# Patient Record
Sex: Female | Born: 1945 | Race: Black or African American | Hispanic: Yes | Smoking: Former smoker
Health system: Southern US, Community
[De-identification: ages and names within clinical notes are randomized; demographics above are authoritative.]

## PROBLEM LIST (undated history)

## (undated) DIAGNOSIS — L253 Unspecified contact dermatitis due to other chemical products: Secondary | ICD-10-CM

## (undated) DIAGNOSIS — R05 Cough: Secondary | ICD-10-CM

## (undated) DIAGNOSIS — L309 Dermatitis, unspecified: Secondary | ICD-10-CM

## (undated) DIAGNOSIS — H409 Unspecified glaucoma: Secondary | ICD-10-CM

## (undated) DIAGNOSIS — W19XXXA Unspecified fall, initial encounter: Secondary | ICD-10-CM

## (undated) DIAGNOSIS — M1711 Unilateral primary osteoarthritis, right knee: Secondary | ICD-10-CM

## (undated) DIAGNOSIS — T8859XA Other complications of anesthesia, initial encounter: Secondary | ICD-10-CM

## (undated) DIAGNOSIS — G8929 Other chronic pain: Secondary | ICD-10-CM

## (undated) DIAGNOSIS — G4700 Insomnia, unspecified: Secondary | ICD-10-CM

## (undated) DIAGNOSIS — M2540 Effusion, unspecified joint: Secondary | ICD-10-CM

## (undated) DIAGNOSIS — R011 Cardiac murmur, unspecified: Secondary | ICD-10-CM

## (undated) DIAGNOSIS — D649 Anemia, unspecified: Secondary | ICD-10-CM

## (undated) DIAGNOSIS — K219 Gastro-esophageal reflux disease without esophagitis: Secondary | ICD-10-CM

## (undated) DIAGNOSIS — F329 Major depressive disorder, single episode, unspecified: Secondary | ICD-10-CM

## (undated) DIAGNOSIS — M62838 Other muscle spasm: Secondary | ICD-10-CM

## (undated) DIAGNOSIS — R911 Solitary pulmonary nodule: Secondary | ICD-10-CM

## (undated) DIAGNOSIS — R350 Frequency of micturition: Secondary | ICD-10-CM

## (undated) DIAGNOSIS — F32A Depression, unspecified: Secondary | ICD-10-CM

## (undated) DIAGNOSIS — M1990 Unspecified osteoarthritis, unspecified site: Secondary | ICD-10-CM

## (undated) DIAGNOSIS — M2550 Pain in unspecified joint: Secondary | ICD-10-CM

## (undated) DIAGNOSIS — R059 Cough, unspecified: Secondary | ICD-10-CM

## (undated) DIAGNOSIS — I10 Essential (primary) hypertension: Secondary | ICD-10-CM

## (undated) DIAGNOSIS — Y92239 Unspecified place in hospital as the place of occurrence of the external cause: Secondary | ICD-10-CM

## (undated) DIAGNOSIS — Z8709 Personal history of other diseases of the respiratory system: Secondary | ICD-10-CM

## (undated) DIAGNOSIS — E785 Hyperlipidemia, unspecified: Secondary | ICD-10-CM

## (undated) DIAGNOSIS — M549 Dorsalgia, unspecified: Secondary | ICD-10-CM

## (undated) DIAGNOSIS — T4145XA Adverse effect of unspecified anesthetic, initial encounter: Secondary | ICD-10-CM

## (undated) DIAGNOSIS — S86811A Strain of other muscle(s) and tendon(s) at lower leg level, right leg, initial encounter: Secondary | ICD-10-CM

## (undated) DIAGNOSIS — E119 Type 2 diabetes mellitus without complications: Secondary | ICD-10-CM

## (undated) DIAGNOSIS — K5900 Constipation, unspecified: Secondary | ICD-10-CM

## (undated) DIAGNOSIS — J45909 Unspecified asthma, uncomplicated: Secondary | ICD-10-CM

## (undated) DIAGNOSIS — Z96659 Presence of unspecified artificial knee joint: Secondary | ICD-10-CM

## (undated) DIAGNOSIS — M1712 Unilateral primary osteoarthritis, left knee: Secondary | ICD-10-CM

## (undated) DIAGNOSIS — R3915 Urgency of urination: Secondary | ICD-10-CM

---

## 1995-06-14 HISTORY — PX: GALLBLADDER SURGERY: SHX652

## 2014-04-17 ENCOUNTER — Encounter (HOSPITAL_COMMUNITY): Payer: Self-pay | Admitting: Family Medicine

## 2014-04-17 ENCOUNTER — Emergency Department (HOSPITAL_COMMUNITY)
Admission: EM | Admit: 2014-04-17 | Discharge: 2014-04-17 | Disposition: A | Discharge status: Home / Self Care | Payer: Medicare HMO | Source: Home / Self Care | Attending: Family Medicine | Admitting: Family Medicine

## 2014-04-17 DIAGNOSIS — M1990 Unspecified osteoarthritis, unspecified site: Secondary | ICD-10-CM

## 2014-04-17 DIAGNOSIS — I10 Essential (primary) hypertension: Secondary | ICD-10-CM

## 2014-04-17 HISTORY — DX: Unspecified glaucoma: H40.9

## 2014-04-17 HISTORY — DX: Type 2 diabetes mellitus without complications: E11.9

## 2014-04-17 HISTORY — DX: Solitary pulmonary nodule: R91.1

## 2014-04-17 HISTORY — DX: Essential (primary) hypertension: I10

## 2014-04-17 HISTORY — DX: Insomnia, unspecified: G47.00

## 2014-04-17 HISTORY — DX: Unspecified asthma, uncomplicated: J45.909

## 2014-04-17 HISTORY — DX: Hyperlipidemia, unspecified: E78.5

## 2014-04-17 HISTORY — DX: Unspecified osteoarthritis, unspecified site: M19.90

## 2014-04-17 MED ORDER — VALSARTAN 160 MG PO TABS: 160.0000 mg | ORAL_TABLET | Freq: Every day | ORAL | Status: DC

## 2014-04-17 MED ORDER — TRAMADOL HCL 50 MG PO TABS: 50.0000 mg | ORAL_TABLET | Freq: Four times a day (QID) | ORAL | Status: DC | PRN

## 2014-04-17 MED ORDER — MELOXICAM 15 MG PO TABS: 7.5000 mg | ORAL_TABLET | Freq: Every day | ORAL | Status: DC

## 2014-04-17 NOTE — Discharge Instructions (Signed)
Please start taking the meloxicam daily for the knee pain as well as 1-2 tramadol Please restart yoru blood pressure medications Please make sure to keep your appointment with your new doctor [facility] 

## 2014-04-17 NOTE — ED Notes (Signed)
Pt triaged and assessed by provider.   Provider in before nurse. 

## 2014-04-17 NOTE — ED Provider Notes (Signed)
CSN: [id number]     Arrival date & time 04/17/14  1612 History   First MD Initiated Contact with Patient 04/17/14 1647     Chief Complaint  Patient presents with  . Medication Refill   (Consider location/radiation/quality/duration/timing/severity/associated sxs/prior Treatment) HPI  Persistent bilat knee pain. Chronic osteoarthritis. Voltaren is not working. Tramadol works. Just moved to area. Has appt w/ new PCP on 05/01/14. PCP is Dr. Dorrene GermanEdwin A Avbuere.     HTN: no CP, palpitations, SOB. Out of diovan since moving.   Past Medical History  Diagnosis Date  . Insomnia   . Glaucoma   . Pulmonary nodule   . Asthma   . HLD (hyperlipidemia)   . Hypertension   . Diabetes mellitus without complication   . Schizoaffective disorder   . Osteoarthritis     knees   No past surgical history on file. No family history on file. History  Substance Use Topics  . Smoking status: Former Smoker    Quit date: 03/17/1989  . Smokeless tobacco: Not on file  . Alcohol Use: Not on file   OB History    No data available     Review of Systems Per HPI with all other pertinent systems negative.   Allergies  Bactrim; Codeine; Iodine; Vicodin; and Ivp dye  Home Medications   Prior to Admission medications   Medication Sig Start Date End Date Taking? Authorizing Provider  albuterol (PROVENTIL HFA;VENTOLIN HFA) 108 (90 BASE) MCG/ACT inhaler Inhale into the lungs every 6 (six) hours as needed for wheezing or shortness of breath.   Yes Historical Provider, MD  aspirin 81 MG tablet Take 81 mg by mouth daily.   Yes Historical Provider, MD  baclofen (LIORESAL) 10 MG tablet Take 10 mg by mouth 3 (three) times daily.   Yes Historical Provider, MD  budesonide-formoterol (SYMBICORT) 80-4.5 MCG/ACT inhaler Inhale 2 puffs into the lungs 2 (two) times daily.   Yes Historical Provider, MD  diclofenac (VOLTAREN) 75 MG EC tablet Take 75 mg by mouth 2 (two) times daily.   Yes Historical Provider, MD   levocetirizine (XYZAL) 2.5 MG/5ML solution Take 5 mg by mouth every evening.   Yes Historical Provider, MD  pravastatin (PRAVACHOL) 40 MG tablet Take 40 mg by mouth daily.   Yes Historical Provider, MD  pregabalin (LYRICA) 50 MG capsule Take 50 mg by mouth 2 (two) times daily.   Yes Historical Provider, MD  traZODone (DESYREL) 100 MG tablet Take 100 mg by mouth at bedtime.   Yes Historical Provider, MD  meloxicam (MOBIC) 15 MG tablet Take 0.5-1 tablets (7.5-15 mg total) by mouth daily. 04/17/14   Ozella Rocksavid J Merrell, MD  traMADol (ULTRAM) 50 MG tablet Take 1-2 tablets (50-100 mg total) by mouth every 6 (six) hours as needed. 04/17/14   Ozella Rocksavid J Merrell, MD  valsartan (DIOVAN) 160 MG tablet Take 1 tablet (160 mg total) by mouth daily. 04/17/14   Ozella Rocksavid J Merrell, MD   BP 159/77 mmHg  Pulse 69  Temp(Src) 98.9 F (37.2 C) (Oral)  Resp 20  SpO2 98% Physical Exam  ED Course  Procedures (including critical care time) Labs Review Labs Reviewed - No data to display  Imaging Review No results found.   MDM   1. Essential hypertension   2. Arthritis    Schizoaffective disorder: at baseline  Hypertensive today: refill valsartan  OA: refill tramadol. Change voltaren to meloxicam. F/u new PCP  Precautions given and all questions answered  Shelly Flattenavid Merrell, MD [facility] 04/17/2014, 5:10 PM    Ozella Rocksavid J Merrell, MD 04/17/14 1710

## 2014-05-20 ENCOUNTER — Ambulatory Visit: Payer: Self-pay | Admitting: Podiatry

## 2014-06-12 ENCOUNTER — Encounter: Payer: Self-pay | Admitting: *Deleted

## 2014-07-09 ENCOUNTER — Telehealth: Payer: Self-pay | Admitting: *Deleted

## 2014-07-09 NOTE — Telephone Encounter (Signed)
Patient left message on Nurse Voicemail that she is having trouble getting through to the office and she has a question about her appointment.  I have called patient and received no answer.  I left a message asking her to contact me.  I will await return call.

## 2014-07-14 ENCOUNTER — Encounter: Payer: Medicare HMO | Admitting: Obstetrics & Gynecology

## 2014-07-17 ENCOUNTER — Encounter: Payer: Medicare HMO | Admitting: Family Medicine

## 2014-07-18 ENCOUNTER — Encounter: Payer: Self-pay | Admitting: Internal Medicine

## 2014-07-23 ENCOUNTER — Telehealth: Payer: Self-pay | Admitting: *Deleted

## 2014-07-23 NOTE — Telephone Encounter (Signed)
Pt no showed Pv 0800 am 2-10 Wednesday. [facility]. At 0832 am.  Brooke Davidson PV

## 2014-07-30 ENCOUNTER — Telehealth: Payer: Self-pay | Admitting: Podiatry

## 2014-07-30 NOTE — Telephone Encounter (Signed)
PT CALLED AND WAS VERY RUDE TO JESSICA S STATING SHE LEFT A MESSAGE A MONTH AGO AND TODAY AND NO ONE HAS CALLED HER BACK. THE MESSAGE FROM TODAY WAS AT 1042 AM AND PT CALLED BACK AT 1053 AM. I CALLED PT BACK AT 1110 AM AND LEFT MESSAGE FOR PT TO CALL ME BACK TO SCHEDULE AN APPT.

## 2014-08-04 ENCOUNTER — Other Ambulatory Visit (HOSPITAL_COMMUNITY)
Admission: RE | Admit: 2014-08-04 | Discharge: 2014-08-04 | Disposition: A | Discharge status: Home / Self Care | Payer: Commercial Managed Care - HMO | Source: Ambulatory Visit | Attending: Obstetrics & Gynecology | Admitting: Obstetrics & Gynecology

## 2014-08-04 ENCOUNTER — Encounter: Payer: Self-pay | Admitting: Obstetrics & Gynecology

## 2014-08-04 ENCOUNTER — Ambulatory Visit (INDEPENDENT_AMBULATORY_CARE_PROVIDER_SITE_OTHER): Payer: Medicare HMO | Admitting: Obstetrics & Gynecology

## 2014-08-04 ENCOUNTER — Encounter: Payer: Self-pay | Admitting: Nurse Practitioner

## 2014-08-04 VITALS — BP 144/63 | HR 55 | Wt 153.8 lb

## 2014-08-04 DIAGNOSIS — Z124 Encounter for screening for malignant neoplasm of cervix: Secondary | ICD-10-CM

## 2014-08-04 DIAGNOSIS — Z01419 Encounter for gynecological examination (general) (routine) without abnormal findings: Secondary | ICD-10-CM | POA: Insufficient documentation

## 2014-08-04 DIAGNOSIS — Z1151 Encounter for screening for human papillomavirus (HPV): Secondary | ICD-10-CM

## 2014-08-04 DIAGNOSIS — R87619 Unspecified abnormal cytological findings in specimens from cervix uteri: Secondary | ICD-10-CM | POA: Diagnosis not present

## 2014-08-04 NOTE — Progress Notes (Signed)
Pt here for annual exam.

## 2014-08-04 NOTE — Progress Notes (Signed)
Patient ID: Brooke FlingSandra Davidson, female   DOB: Jun 09, 1946, 69 y.o.   MRN: [id number]  Chief Complaint  Patient presents with  . Gynecologic Exam    HPI Brooke Davidson is a 69 y.o. female.  No LMP recorded. Patient is postmenopausal.age 69  [place] and was told they were watching her paps.  HPI  Past Medical History  Diagnosis Date  . Insomnia   . Glaucoma   . Pulmonary nodule   . Asthma   . HLD (hyperlipidemia)   . Hypertension   . Diabetes mellitus without complication   . Schizoaffective disorder   . Osteoarthritis     knees    Past Surgical History  Procedure Laterality Date  . Gallbladder surgery      History reviewed. No pertinent family history.  Social History History  Substance Use Topics  . Smoking status: Former Smoker    Quit date: 03/17/1989  . Smokeless tobacco: Not on file  . Alcohol Use: Not on file    Allergies  Allergen Reactions  . Bactrim [Sulfamethoxazole-Trimethoprim] Swelling  . Codeine Itching  . Iodine   . Vicodin [Hydrocodone-Acetaminophen] Itching  . Ivp Dye [Iodinated Diagnostic Agents] Swelling and Rash    Current Outpatient Prescriptions  Medication Sig Dispense Refill  . albuterol (PROVENTIL HFA;VENTOLIN HFA) 108 (90 BASE) MCG/ACT inhaler Inhale into the lungs every 6 (six) hours as needed for wheezing or shortness of breath.    Marland Kitchen. aspirin 81 MG tablet Take 81 mg by mouth daily.    . baclofen (LIORESAL) 10 MG tablet Take 10 mg by mouth 3 (three) times daily.    . budesonide-formoterol (SYMBICORT) 80-4.5 MCG/ACT inhaler Inhale 2 puffs into the lungs 2 (two) times daily.    Marland Kitchen. desoximetasone (TOPICORT) 0.05 % cream Apply 1 application topically 2 (two) times daily.    . diclofenac (VOLTAREN) 75 MG EC tablet Take 75 mg by mouth 2 (two) times daily.    Marland Kitchen. ENSURE PLUS (ENSURE PLUS) LIQD Take 237 mLs by mouth.    . fluocinonide cream (LIDEX) 0.05 % Apply 1 application topically 2 (two) times daily.    Marland Kitchen.  levocetirizine (XYZAL) 2.5 MG/5ML solution Take 5 mg by mouth every evening.    . metFORMIN (GLUCOPHAGE) 500 MG tablet Take 500 mg by mouth 2 (two) times daily with a meal.    . Olopatadine HCl 0.2 % SOLN Apply 1 drop to eye.    . pravastatin (PRAVACHOL) 40 MG tablet Take 40 mg by mouth daily.    . pregabalin (LYRICA) 50 MG capsule Take 50 mg by mouth 2 (two) times daily.    . traMADol (ULTRAM) 50 MG tablet Take 1-2 tablets (50-100 mg total) by mouth every 6 (six) hours as needed. 60 tablet 0  . traZODone (DESYREL) 100 MG tablet Take 100 mg by mouth at bedtime.    . valsartan (DIOVAN) 160 MG tablet Take 1 tablet (160 mg total) by mouth daily. 30 tablet 0   No current facility-administered medications for this visit.    Review of Systems Review of Systems  Constitutional: Negative.   Respiratory: Negative.   Gastrointestinal: Negative.   Genitourinary: Negative.   Musculoskeletal: Positive for arthralgias (knees).    Blood pressure 144/63, pulse 55, weight 153 lb 12.8 oz (69.763 kg).  Physical Exam Physical Exam  Constitutional: She is oriented to person, place, and time. She appears well-developed. No distress.  Pulmonary/Chest: Effort normal.  Breasts: breasts appear normal, no suspicious masses, no  skin or nipple changes or axillary nodes.   Abdominal: Soft. She exhibits no distension. There is no tenderness.  Genitourinary: Vagina normal and uterus normal. No vaginal discharge found.  Pap done cx nl, no mass  Neurological: She is alert and oriented to person, place, and time.  Skin: Skin is dry.  Psychiatric: She has a normal mood and affect.    Data Reviewed [facility] medical referral  Assessment    Postmenopausal states recent h/o abnl pap. H/O schizophrenia   well woman gyn exam  Plan    Pap result, ROI from Gyn in [place]  Mammogram        ARNOLD,JAMES 08/04/2014, 4:34 PM

## 2014-08-04 NOTE — Patient Instructions (Signed)
Mammography Mammography is an X-ray of the breasts to look for changes that are not normal. The X-ray image is called a mammogram. This procedure can screen for breast cancer, can detect cancer early, and can diagnose cancer.  LET YOUR CAREGIVER KNOW ABOUT:  Breast implants.  Previous breast disease, biopsy, or surgery.  If you are breastfeeding.  Medicines taken, including vitamins, herbs, eyedrops, over-the-counter medicines, and creams.  Use of steroids (by mouth or creams).  Possibility of pregnancy, if this applies. RISKS AND COMPLICATIONS  Exposure to radiation, but at very low levels.  The results may be misinterpreted.  The results may not be accurate.  Mammography may lead to further tests.  Mammography may not catch certain cancers. BEFORE THE PROCEDURE  Schedule your test about 7 days after your menstrual period. This is when your breasts are the least tender and have signs of hormone changes.  If you have had a mammography done at a different facility in the past, get the mammogram X-rays or have them sent to your current exam facility in order to compare them.  Wash your breasts and under your arms the day of the test.  Do not wear deodorants, perfumes, or powders anywhere on your body.  Wear clothes that you can change in and out of easily. PROCEDURE Relax as much as possible during the test. Any discomfort during the test will be very brief. The test should take less than 30 minutes. The following will happen:  You will undress from the waist up and put on a gown.  You will stand in front of the X-ray machine.  Each breast will be placed between 2 plastic or glass plates. The plates will compress your breast for a few seconds.  X-rays will be taken from different angles of the breast. AFTER THE PROCEDURE  The mammogram will be examined.  Depending on the quality of the images, you may need to repeat certain parts of the test.  Ask when your test  results will be ready. Make sure you get your test results.  You may resume normal activities. Document Released: 05/27/2000 Document Revised: 08/22/2011 Document Reviewed: 03/20/2011 ExitCare Patient Information 2015 [employer]. This information is not intended to replace advice given to you by your health care provider. Make sure you discuss any questions you have with your health care provider.  

## 2014-08-05 LAB — CYTOLOGY - PAP

## 2014-08-06 ENCOUNTER — Encounter: Payer: Medicare HMO | Admitting: Internal Medicine

## 2014-08-07 ENCOUNTER — Telehealth: Payer: Self-pay

## 2014-08-07 NOTE — Telephone Encounter (Signed)
Called patient and informed her of results and recommendations. Patient verbalized understanding and gratitude. No questions or concerns.  

## 2014-08-07 NOTE — Telephone Encounter (Signed)
-----   Message from Adam PhenixJames G Arnold, MD sent at 08/07/2014  8:47 AM EST ----- Repeat pap cotesting 1 year

## 2014-08-14 ENCOUNTER — Encounter: Payer: Medicare HMO | Admitting: Obstetrics & Gynecology

## 2014-08-18 ENCOUNTER — Telehealth: Payer: Self-pay | Admitting: Podiatry

## 2014-08-18 NOTE — Telephone Encounter (Signed)
PT CALLED SEVERAL TIMES THIS MORNING AND LEFT LONG MESSAGES RETURNING CALL FROM 07/30/14 TO SCHEDULE AN APPT. ONE MESSAGE SHE SAID SHE COULD NOT BELIEVE WHAT POOR SERVICE AND THAT IT WAS THE WORST SINCE SHE MOVED TO [place]. I RETURNED CALL AND EXPLAINED THAT WE GOT THE REFERRAL BUT NEEDED AUTHORIZATION FROM DR AVBEUERE'S OFFICE. SHE CALLED BACK TWICE  WAS VERY RUDE TO JESSICA S AND HUNG UP ON THE SECOND CALL, WHEN JESSICA TOLD PT WE WERE WAITING ON AUTHORIZATION FOR HER APPT. WE DID FINALLY GET THE AUTH AND I CALLED PT BACK AFTER LUNCH AND SHE SAID SHE HAD TO CALL ME BACK.

## 2014-08-24 ENCOUNTER — Encounter (HOSPITAL_COMMUNITY): Payer: Self-pay | Admitting: *Deleted

## 2014-08-24 ENCOUNTER — Emergency Department (HOSPITAL_COMMUNITY)
Admission: EM | Admit: 2014-08-24 | Discharge: 2014-08-24 | Disposition: A | Discharge status: Home / Self Care | Payer: Commercial Managed Care - HMO | Attending: Emergency Medicine | Admitting: Emergency Medicine

## 2014-08-24 DIAGNOSIS — G4762 Sleep related leg cramps: Secondary | ICD-10-CM

## 2014-08-24 DIAGNOSIS — Z87891 Personal history of nicotine dependence: Secondary | ICD-10-CM | POA: Diagnosis not present

## 2014-08-24 DIAGNOSIS — Z7951 Long term (current) use of inhaled steroids: Secondary | ICD-10-CM | POA: Insufficient documentation

## 2014-08-24 DIAGNOSIS — Z79899 Other long term (current) drug therapy: Secondary | ICD-10-CM | POA: Diagnosis not present

## 2014-08-24 DIAGNOSIS — J45909 Unspecified asthma, uncomplicated: Secondary | ICD-10-CM | POA: Insufficient documentation

## 2014-08-24 DIAGNOSIS — Z8659 Personal history of other mental and behavioral disorders: Secondary | ICD-10-CM | POA: Diagnosis not present

## 2014-08-24 DIAGNOSIS — M1990 Unspecified osteoarthritis, unspecified site: Secondary | ICD-10-CM | POA: Insufficient documentation

## 2014-08-24 DIAGNOSIS — I10 Essential (primary) hypertension: Secondary | ICD-10-CM | POA: Insufficient documentation

## 2014-08-24 DIAGNOSIS — Z7982 Long term (current) use of aspirin: Secondary | ICD-10-CM | POA: Insufficient documentation

## 2014-08-24 DIAGNOSIS — Z791 Long term (current) use of non-steroidal anti-inflammatories (NSAID): Secondary | ICD-10-CM | POA: Diagnosis not present

## 2014-08-24 DIAGNOSIS — E119 Type 2 diabetes mellitus without complications: Secondary | ICD-10-CM | POA: Diagnosis not present

## 2014-08-24 DIAGNOSIS — M79605 Pain in left leg: Secondary | ICD-10-CM | POA: Diagnosis present

## 2014-08-24 DIAGNOSIS — E785 Hyperlipidemia, unspecified: Secondary | ICD-10-CM | POA: Diagnosis not present

## 2014-08-24 LAB — BASIC METABOLIC PANEL
Anion gap: 9 (ref 5–15)
BUN: 20 mg/dL (ref 6–23)
CO2: 23 mmol/L (ref 19–32)
Calcium: 9.4 mg/dL (ref 8.4–10.5)
Chloride: 107 mmol/L (ref 96–112)
Creatinine, Ser: 0.98 mg/dL (ref 0.50–1.10)
GFR calc Af Amer: 68 mL/min — ABNORMAL LOW (ref >90)
GFR, EST NON AFRICAN AMERICAN: 58 mL/min — AB (ref >90)
Glucose, Bld: 87 mg/dL (ref 70–99)
POTASSIUM: 4.6 mmol/L (ref 3.5–5.1)
Sodium: 139 mmol/L (ref 135–145)

## 2014-08-24 LAB — CBC WITH DIFFERENTIAL/PLATELET
BASOS PCT: 1 % (ref 0–1)
Basophils Absolute: 0 10*3/uL (ref 0.0–0.1)
Eosinophils Absolute: 0.1 10*3/uL (ref 0.0–0.7)
Eosinophils Relative: 1 % (ref 0–5)
HCT: 39.5 % (ref 36.0–46.0)
HEMOGLOBIN: 13.9 g/dL (ref 12.0–15.0)
Lymphocytes Relative: 41 % (ref 12–46)
Lymphs Abs: 2.5 10*3/uL (ref 0.7–4.0)
MCH: 29 pg (ref 26.0–34.0)
MCHC: 35.2 g/dL (ref 30.0–36.0)
MCV: 82.3 fL (ref 78.0–100.0)
MONO ABS: 0.7 10*3/uL (ref 0.1–1.0)
MONOS PCT: 11 % (ref 3–12)
NEUTROS ABS: 2.9 10*3/uL (ref 1.7–7.7)
Neutrophils Relative %: 46 % (ref 43–77)
PLATELETS: 361 10*3/uL (ref 150–400)
RBC: 4.8 MIL/uL (ref 3.87–5.11)
RDW: 13.9 % (ref 11.5–15.5)
WBC: 6.2 10*3/uL (ref 4.0–10.5)

## 2014-08-24 LAB — CK: Total CK: 97 U/L (ref 7–177)

## 2014-08-24 MED ORDER — IBUPROFEN 400 MG PO TABS
600.0000 mg | ORAL_TABLET | Freq: Once | ORAL | Status: AC
  Administered 2014-08-24: 600 mg via ORAL
  Filled 2014-08-24 (×2): qty 1

## 2014-08-24 NOTE — ED Notes (Signed)
Pt states she has extreme leg cramping from the knee down for the past week.  Pt denies pain at this time.

## 2014-08-24 NOTE — Discharge Instructions (Signed)
The cause of nocturnal leg cramps is often times unknown, but some cases have been linked to:  Sitting for long periods of time Over-exertion of the muscles Standing or working on concrete floors Sitting improperly  ALSO, cramping can be from other condition, like thyroid, or medications that your pcp can check for.  Who gets nocturnal leg cramps? Anyone can get these types of cramps. However, they tend to be found more often in people who are middle-aged or older.  Do I need to have any testing done to evaluate my leg cramps? If leg cramps are frequent and severe, your doctor may order lab work to ensure that there are no electrolyte imbalances. READ THE INFORMATION BELOW - AND SEE YOUR PRIMARY SOON.  What can I do to help prevent these cramps from happening? Make sure that you stay hydrated--drink six to eight glasses of water each day. Gently stretch your leg muscles before you go to sleep. Keep blankets and sheets loose around your feet so that toes are not distorted. Wear properly fitted shoes. Spend a few minutes riding a stationary bicycle before going to bed. What can I do to make nocturnal leg cramps go away if they happen? Forcefully stretching the affected muscle is usually the most effective way to relieve the cramp. You might be able to relieve the cramp by walking around, jiggling your leg, or massaging the leg. Warm baths or showers may be helpful. Alternatively, applying ice has also shown some benefit.   Leg Cramps Leg cramps that occur during exercise can be caused by poor circulation or dehydration. However, muscle cramps that occur at rest or during the night are usually not due to any serious medical problem. Heat cramps may cause muscle spasms during hot weather.  CAUSES There is no clear cause for muscle cramps. However, dehydration may be a factor for those who do not drink enough fluids and those who exercise in the heat. Imbalances in the level of sodium,  potassium, calcium or magnesium in the muscle tissue may also be a factor. Some medications, such as water pills (diuretics), may cause loss of chemicals that the body needs (like sodium and potassium) and cause muscle cramps. TREATMENT   Make sure your diet has enough fluids and essential minerals for the muscle to work normally.  Avoid strenuous exercise for several days if you have been having frequent leg cramps.  Stretch and massage the cramped muscle for several minutes.  Some medicines may be helpful in some patients with night cramps. Only take over-the-counter or prescription medicines as directed by your caregiver. SEEK IMMEDIATE MEDICAL CARE IF:   Your leg cramps become worse.  Your foot becomes cold, numb, or blue. Document Released: 07/07/2004 Document Revised: 08/22/2011 Document Reviewed: 06/24/2008 [facility] Patient Information 2015 [place]. This information is not intended to replace advice given to you by your health care provider. Make sure you discuss any questions you have with your health care provider.

## 2014-08-24 NOTE — ED Notes (Signed)
Pt states she has severe bi lateral leg cramps from her knee down.  Pt is getting bilateral knee replacement in June.  Pt states pain is 10/10 when cramping but states she has no pain at this moment.

## 2014-08-24 NOTE — ED Provider Notes (Signed)
CSN: [id number]     Arrival date & time 08/24/14  1113 History   First MD Initiated Contact with Patient 08/24/14 1145     Chief Complaint  Patient presents with  . Leg Pain     (Consider location/radiation/quality/duration/timing/severity/associated sxs/prior Treatment) HPI Comments: Pt comes in with cc of bilateral leg pain. Hx of DM, HL, and poor OA with bilateral knee. She comes in with cc of leg cramoing for 1 week now. Pain is present when pt is supine, and mostly occurs at night. Pt is unable to sleep well due to that. Pt has no trauma, no new meds. She has no hx of similar sx. No hx of PE, DVT and no risk factors for that. No new swelling.  Patient is a 69 y.o. female presenting with leg pain. The history is provided by the patient.  Leg Pain   Past Medical History  Diagnosis Date  . Insomnia   . Glaucoma   . Pulmonary nodule   . Asthma   . HLD (hyperlipidemia)   . Hypertension   . Diabetes mellitus without complication   . Schizoaffective disorder   . Osteoarthritis     knees   Past Surgical History  Procedure Laterality Date  . Gallbladder surgery     History reviewed. No pertinent family history. History  Substance Use Topics  . Smoking status: Former Smoker    Quit date: 03/17/1989  . Smokeless tobacco: Not on file  . Alcohol Use: Not on file   OB History    Gravida Para Term Preterm AB TAB SAB Ectopic Multiple Living   3 2 2  1 1    2      Review of Systems  Constitutional: Negative for chills.  Musculoskeletal: Positive for myalgias.  Skin: Negative for wound.  Neurological: Negative for tremors, weakness and numbness.      Allergies  Bactrim; Ivp dye; Iodine; Codeine; and Vicodin  Home Medications   Prior to Admission medications   Medication Sig Start Date End Date Taking? Authorizing Provider  acetaminophen (TYLENOL) 500 MG tablet Take 1,000 mg by mouth every 6 (six) hours as needed.   Yes Historical Provider, MD  albuterol (PROVENTIL  HFA;VENTOLIN HFA) 108 (90 BASE) MCG/ACT inhaler Inhale into the lungs every 6 (six) hours as needed for wheezing or shortness of breath.   Yes Historical Provider, MD  ASPIRIN LOW DOSE 81 MG EC tablet Take 81 mg by mouth daily.  07/18/14  Yes Historical Provider, MD  baclofen (LIORESAL) 10 MG tablet Take 10 mg by mouth 3 (three) times daily.    Yes Historical Provider, MD  budesonide-formoterol (SYMBICORT) 80-4.5 MCG/ACT inhaler Inhale 2 puffs into the lungs 2 (two) times daily.   Yes Historical Provider, MD  diclofenac (VOLTAREN) 75 MG EC tablet Take 75 mg by mouth 2 (two) times daily.   Yes Historical Provider, MD  ENSURE PLUS (ENSURE PLUS) LIQD Take 237 mLs by mouth.   Yes Historical Provider, MD  latanoprost (XALATAN) 0.005 % ophthalmic solution Place 1 drop into both eyes at bedtime.  07/31/14  Yes Historical Provider, MD  metFORMIN (GLUCOPHAGE) 500 MG tablet Take 500 mg by mouth 2 (two) times daily with a meal.   Yes Historical Provider, MD  OVER THE COUNTER MEDICATION Take 1 tablet by mouth as needed (heartburn).   Yes Historical Provider, MD  pravastatin (PRAVACHOL) 40 MG tablet Take 80 mg by mouth daily.    Yes Historical Provider, MD  traMADol (ULTRAM) 50 MG tablet  Take 1-2 tablets (50-100 mg total) by mouth every 6 (six) hours as needed. 04/17/14  Yes Ozella Rocks, MD  valsartan (DIOVAN) 160 MG tablet Take 1 tablet (160 mg total) by mouth daily. 04/17/14  Yes Ozella Rocks, MD  desoximetasone (TOPICORT) 0.05 % cream Apply 1 application topically 2 (two) times daily as needed.     Historical Provider, MD  levocetirizine (XYZAL) 2.5 MG/5ML solution Take 5 mg by mouth at bedtime as needed for allergies.     Historical Provider, MD  Olopatadine HCl 0.2 % SOLN Apply 1 drop to eye.    Historical Provider, MD  pregabalin (LYRICA) 50 MG capsule Take 50 mg by mouth 2 (two) times daily.    Historical Provider, MD   BP 142/65 mmHg  Pulse 68  Temp(Src) 98.1 F (36.7 C) (Oral)  Resp 17  Ht   (1.575 m)  Wt 154 lb (69.854 kg)  BMI 28.16 kg/m2  SpO2 99% Physical Exam  Constitutional: She is oriented to person, place, and time. She appears well-developed and well-nourished.  HENT:  Head: Normocephalic and atraumatic.  Eyes: EOM are normal. Pupils are equal, round, and reactive to light.  Neck: Neck supple.  Cardiovascular: Normal rate, regular rhythm, normal heart sounds and intact distal pulses.   No murmur heard. Pulmonary/Chest: Effort normal. No respiratory distress.  Abdominal: Soft. She exhibits no distension. There is no tenderness. There is no rebound and no guarding.  Musculoskeletal: She exhibits no edema or tenderness.  Neurological: She is alert and oriented to person, place, and time.  Skin: Skin is warm and dry. No rash noted.  Nursing note and vitals reviewed.   ED Course  Procedures (including critical care time) Labs Review Labs Reviewed  BASIC METABOLIC PANEL - Abnormal; Notable for the following:    GFR calc non Af Amer 58 (*)    GFR calc Af Amer 68 (*)    All other components within normal limits  CBC WITH DIFFERENTIAL/PLATELET  CK    Imaging Review No results found.   EKG Interpretation None      MDM   Final diagnoses:  Nocturnal leg cramps   Pt with leg pain, bilateral nocturnal cramping. Labs are neg. Meds vs. Metabolic vs neuropathy favored. Advised to see her pcp. She has baclofen and tramadol already at home.   Derwood Kaplan, MD 08/24/14 [phone]

## 2014-08-26 ENCOUNTER — Ambulatory Visit (INDEPENDENT_AMBULATORY_CARE_PROVIDER_SITE_OTHER): Payer: Commercial Managed Care - HMO

## 2014-08-26 ENCOUNTER — Ambulatory Visit (INDEPENDENT_AMBULATORY_CARE_PROVIDER_SITE_OTHER): Payer: Commercial Managed Care - HMO | Admitting: Podiatry

## 2014-08-26 ENCOUNTER — Encounter: Payer: Self-pay | Admitting: Podiatry

## 2014-08-26 VITALS — BP 120/58 | HR 73 | Resp 16

## 2014-08-26 DIAGNOSIS — M779 Enthesopathy, unspecified: Secondary | ICD-10-CM

## 2014-08-26 DIAGNOSIS — M79674 Pain in right toe(s): Secondary | ICD-10-CM

## 2014-08-26 DIAGNOSIS — E119 Type 2 diabetes mellitus without complications: Secondary | ICD-10-CM | POA: Diagnosis not present

## 2014-08-26 DIAGNOSIS — B351 Tinea unguium: Secondary | ICD-10-CM

## 2014-08-26 DIAGNOSIS — M79606 Pain in leg, unspecified: Secondary | ICD-10-CM

## 2014-08-26 DIAGNOSIS — M79675 Pain in left toe(s): Secondary | ICD-10-CM | POA: Diagnosis not present

## 2014-08-26 NOTE — Patient Instructions (Signed)
Diabetes and Foot Care Diabetes may cause you to have problems because of poor blood supply (circulation) to your feet and legs. This may cause the skin on your feet to become thinner, break easier, and heal more slowly. Your skin may become dry, and the skin may peel and crack. You may also have nerve damage in your legs and feet causing decreased feeling in them. You may not notice minor injuries to your feet that could lead to infections or more serious problems. Taking care of your feet is one of the most important things you can do for yourself.  HOME CARE INSTRUCTIONS  Wear shoes at all times, even in the house. Do not go barefoot. Bare feet are easily injured.  Check your feet daily for blisters, cuts, and redness. If you cannot see the bottom of your feet, use a mirror or ask someone for help.  Wash your feet with warm water (do not use hot water) and mild soap. Then pat your feet and the areas between your toes until they are completely dry. Do not soak your feet as this can dry your skin.  Apply a moisturizing lotion or petroleum jelly (that does not contain alcohol and is unscented) to the skin on your feet and to dry, brittle toenails. Do not apply lotion between your toes.  Trim your toenails straight across. Do not dig under them or around the cuticle. File the edges of your nails with an emery board or nail file.  Do not cut corns or calluses or try to remove them with medicine.  Wear clean socks or stockings every day. Make sure they are not too tight. Do not wear knee-high stockings since they may decrease blood flow to your legs.  Wear shoes that fit properly and have enough cushioning. To break in new shoes, wear them for just a few hours a day. This prevents you from injuring your feet. Always look in your shoes before you put them on to be sure there are no objects inside.  Do not cross your legs. This may decrease the blood flow to your feet.  If you find a minor scrape,  cut, or break in the skin on your feet, keep it and the skin around it clean and dry. These areas may be cleansed with mild soap and water. Do not cleanse the area with peroxide, alcohol, or iodine.  When you remove an adhesive bandage, be sure not to damage the skin around it.  If you have a wound, look at it several times a day to make sure it is healing.  Do not use heating pads or hot water bottles. They may burn your skin. If you have lost feeling in your feet or legs, you may not know it is happening until it is too late.  Make sure your health care provider performs a complete foot exam at least annually or more often if you have foot problems. Report any cuts, sores, or bruises to your health care provider immediately. SEEK MEDICAL CARE IF:   You have an injury that is not healing.  You have cuts or breaks in the skin.  You have an ingrown nail.  You notice redness on your legs or feet.  You feel burning or tingling in your legs or feet.  You have pain or cramps in your legs and feet.  Your legs or feet are numb.  Your feet always feel cold. SEEK IMMEDIATE MEDICAL CARE IF:   There is increasing redness,   swelling, or pain in or around a wound.  There is a red line that goes up your leg.  Pus is coming from a wound.  You develop a fever or as directed by your health care provider.  You notice a bad smell coming from an ulcer or wound. Document Released: 05/27/2000 Document Revised: 01/30/2013 Document Reviewed: 11/06/2012 ExitCare Patient Information 2015 [employer]. This information is not intended to replace advice given to you by your health care provider. Make sure you discuss any questions you have with your health care provider.  

## 2014-08-26 NOTE — Progress Notes (Signed)
   Subjective:    Patient ID: Brooke Davidson, female    DOB: 03-13-1946, 69 y.o.   MRN: [id number]  HPI Comments: "I have these spots on my feet"  Patient c/o tender sub 5th MPJ bilateral, right over left, for several months. She has just moved him here from [place] 4 months ago. The areas are callused. She soaks them. She is diabetic x 1 year. Her last A1C was 7.1 on 08-25-14.     Review of Systems  All other systems reviewed and are negative.      Objective:   Physical Exam:: I have reviewed her past medical history medications allergies surgery social history and review of systems. Pulses are strongly palpable bilateral. Neurologic sensorium is intact percent Weinstein monofilament. Deep tendon reflexes are elicitable today. Muscle strength +5 out of 5 dorsiflexors and plantar flexors and inverters and everters. Orthopedic evaluation demonstrates rectus foot type bilateral. Plantar flexed fifth metatarsals resulting in a area of soft tissue inflammation beneath the fifth metatarsophalangeal joints. This appears to be a bursa or inflamed capsule. Her nails are thick yellow dystrophic, mycotic and painful. She also demonstrates a multitude of porokeratotic lesions plantar aspect of the bilateral foot. These are also painful.        Assessment & Plan:  Assessment: Pain in limb secondary to onychomycosis and porokeratosis bilateral. Bursitis capsulitis fifth metatarsophalangeal joints bilateral.  Plan: Injected the bilateral fifth metatarsophalangeal joints today. Debridement all reactive hyperkeratosis bilateral. Debrided nails 1 through 5 bilateral is a covered service secondary to pain. I will follow-up with her in approximately 4 months.

## 2014-08-27 ENCOUNTER — Other Ambulatory Visit (HOSPITAL_COMMUNITY): Payer: Self-pay | Admitting: Internal Medicine

## 2014-08-27 DIAGNOSIS — I739 Peripheral vascular disease, unspecified: Secondary | ICD-10-CM

## 2014-08-27 DIAGNOSIS — R252 Cramp and spasm: Secondary | ICD-10-CM

## 2014-08-29 ENCOUNTER — Ambulatory Visit (HOSPITAL_COMMUNITY)
Admission: RE | Admit: 2014-08-29 | Discharge: 2014-08-29 | Disposition: A | Discharge status: Home / Self Care | Payer: Commercial Managed Care - HMO | Source: Ambulatory Visit | Attending: Internal Medicine | Admitting: Internal Medicine

## 2014-08-29 DIAGNOSIS — I739 Peripheral vascular disease, unspecified: Secondary | ICD-10-CM | POA: Diagnosis not present

## 2014-08-29 DIAGNOSIS — R252 Cramp and spasm: Secondary | ICD-10-CM | POA: Diagnosis not present

## 2014-08-29 NOTE — Progress Notes (Signed)
VASCULAR LAB PRELIMINARY  ARTERIAL  ABI completed:    RIGHT    LEFT    PRESSURE WAVEFORM  PRESSURE WAVEFORM  BRACHIAL 96  BRACHIAL 89   DP 78  DP 80   AT   AT    PT 81  PT 58   PER   PER    GREAT TOE  NA GREAT TOE  NA    RIGHT LEFT  ABI 0.84 0.83   Bilateral ABIs suggest mild arterial insufficiency.  Erlene QuanBrianna L Mazza, RVT 08/29/2014, 4:47 PM

## 2014-09-19 ENCOUNTER — Other Ambulatory Visit: Payer: Self-pay

## 2014-09-19 NOTE — Patient Outreach (Signed)
[facility]) Care Management  09/19/2014  Brooke Davidson December 11, 1945 [id number]   Telephonic Care Management Coordinator Screening Outreach Call #1 Referral Date:  Received from [facility] on 09/15/2014   Primary MD:  Dr. Fleet Contras, [phone]  next  appt 09/22/2014 at 2:15.  (Last appt was 07/18/2014).   Insurance:  [employer]  / [employer] Delegation   Social:   Patient moved from state [place] approximately 05/[place]  to [place].  Liked living in [place] but C/O cost of living was too high and moved to [place].  Patient has a sister in [place] who helped her move but has a sister niece who live close by in [place].  Patient confirms she is on SSD.   Transportation:  [employer] transportation but will need other transportation resources once [employer] benefit used.   DME:  Glucometer.  RN CM will refer to Social Worker to provide transportation resource information once [employer] has been exhausted.   Other MDs: Orthopedic MD for consult for bilateral knee replacement surgery.   H/o ED visits x 2 since moving to [facility] 08/24/14  Leg pain  (nocturnal leg cramps).  D/c Plan per EPIC:  "Meds vs Metabolic vs neuropathy favored.  Advised to see her pcp." Patient reports last appt was 07/18/2014 and next  appt 09/22/2014 at 2:15.  Patient unable to provide any further details.  04/17/2014  Persistent bilateral knee pain. Chronic osteoarthritis.  HTN and Diabetes Patient C/O weight loss and states she does not understand why she is loosing weight.  States H/o 165 and down to 150 on last MD appt.   Patient does not have any scales in the home and states she can not afford to purchase.   Patient confirms she does not have a blood pressure cuff in the home.  Confirms she does have a glucometer but states "I have to be honest, I do not check by blood sugars."  RN CM discussed with issues causing non-adherence.  Patient states she is just not sure how to use the glucometer.  Patient unable to give blood  sugar readings and was able to locate a Blood pressure reading from a copy of a health record of 144/82.  RN CM identifies barrier to care.  Patient unable to use glucometer.  Patient will refer to Community RN CM services to further assess patients needs. RN CM will refer to Social Work for financial assessment due to lack of funds to purchase Blood pressure cuff and home scales for weighing.    Medications: Patient unable to review medications this call.  Patient states "I need help arranging meds for the week.  I am not able to do this."  Patient states she does not know why she is unable to prepare her meds but she is not.   States she has always been very unorganized and this is not a new problem since moving to [place].   Barrier:  Medication non-adherence relating to patient's inability to manage and organize medications.  RN CM will refer to [employer] for further home assessment.   Skin Patient C/O bites on her since prior to moving from her home in [place].  RN CM confirmed patient followed-up with her doctor in the state of [place] but was told she just has dry skin.  Patient states she picks black things out from under her skin and has placed them in a bottle.  Reports profuse itching.  Patient states she thinks she has scabies.  States  she used public transportation in the state of [place] and wonders if she contacted scabies that way.  Patient states she has not reported this issue to her New primary MD in [place], Dr. Fleet ContrasEdwin Avbuere because she feels "ashamed."   Patient's next primary MD appt is scheduled for 09/22/2014 at 2:15.  (Last primary MD appt was 07/18/2014).   RN CM provided emotional support at identifying a change in condition that should be reported to MD. RN CM discussed ways of contacting potential scabies.  RN CM advised patient should report to MD and obtained patients permission to notify MD of findings to facilitate needed communication regarding this issue and MD assessment needed. Patient  agrees to POC.   RN CM provided education on treatment for possible scabies.   RN CM advised that MD needs to assess patient to diagnosis and treat as indicated.  RN CM notified PCP via [employer] of this issue.   RN CM provided introduction to [facility] services.  Patient agrees to services with [facility] for [employer] RN CM and SW services.  Patient provided with contact name and #, main #, nurse line # for questions or concerns. Encouraged patient to notify PCP of any changes in condition in order to avoid unnecessary use of ED or hospital admission.  RN CM will refer to [facility] Community RN CM and SW services. RN CM advised patient that RN CM will contact via telephone to arrange next appt for community home visit within 10 Business days.   Donato Schultzrystal Boniface Goffe, RN, BSN, [facility], CCM  [facility] Care Management Care Management Coordinator [phone] Office [phone] Direct [phone] Cell

## 2014-09-26 ENCOUNTER — Other Ambulatory Visit: Payer: Self-pay

## 2014-09-26 ENCOUNTER — Other Ambulatory Visit: Payer: Self-pay | Admitting: *Deleted

## 2014-09-26 NOTE — Patient Outreach (Signed)
Telephone call:  Spoke with pt, HIPPA verified.   Discussed with pt f/u on [employer] referral, discussed [facility] services which include home visit from a nurse.   Pt states she wants someone who understands her diet, does not like greens.   Pt states she needs a nutritionist.   Pt agreed to [facility] services, home visit scheduled for 4/19.       Shayne Alkenose M.   Pierzchala RN CCM [facility] Care Management  [phone]

## 2014-09-26 NOTE — Patient Outreach (Signed)
[facility]) Care Management  09/26/2014  Glean SalvoSandra H Elders [birth year] [id number]  CSW called patient to assess needs. Call was dropped and CSW was not able to reach patient after call was dropped. Patient did confirm that she had [employer] Rides through the end of April but would need SCAT in May. She replied she needed help with the forms as she was unfamiliar with resources in [place]. She was observed as very rushed and out of breath when CSW called mentioning she was on her way somewhere in her car when call was dropped. CSW will follow-up with patient next week to to schedule an initial visit.

## 2014-09-29 ENCOUNTER — Ambulatory Visit (HOSPITAL_COMMUNITY)
Admission: RE | Admit: 2014-09-29 | Discharge: 2014-09-29 | Disposition: A | Discharge status: Home / Self Care | Payer: Commercial Managed Care - HMO | Source: Ambulatory Visit | Attending: Obstetrics & Gynecology | Admitting: Obstetrics & Gynecology

## 2014-09-29 ENCOUNTER — Other Ambulatory Visit: Payer: Self-pay | Admitting: *Deleted

## 2014-09-29 ENCOUNTER — Telehealth: Payer: Self-pay

## 2014-09-29 ENCOUNTER — Other Ambulatory Visit: Payer: Self-pay | Admitting: Obstetrics & Gynecology

## 2014-09-29 DIAGNOSIS — Z01419 Encounter for gynecological examination (general) (routine) without abnormal findings: Secondary | ICD-10-CM

## 2014-09-29 DIAGNOSIS — Z1231 Encounter for screening mammogram for malignant neoplasm of breast: Secondary | ICD-10-CM

## 2014-09-29 NOTE — Patient Outreach (Signed)
Received a call from Clarisa FlingSandra Buchanan, asked for [facility] SW Kenney HousemanMatthew Drew to call her.   Dois DavenportSandra states she talked to SW 4/15 and phone died (pt needs help with transportation).    As requested, RN CM to let Molli HazardMatthew SW know.   Also, time for RN CM to see pt on 4/19 was changed from 9am to 10am.     Shayne Alkenose M.   Pierzchala RN CCM [facility] Care Management  [phone]

## 2014-09-29 NOTE — Patient Outreach (Signed)
[facility]) Care Management  09/29/2014  Glean SalvoSandra H Schnorr 1945-09-20 [id number]   Patient called today after call was dropped on 4/15. CSW explained process for signing up for SCAT and that patient could be set up with [facility] transportation resources if needed to bridge the GAP until SCAT can be started. CSW explained the process of applying for SCAT and how SCAT works. Patient has been assigned to [facility]Joanna Saporito for follow-up and CSW will schedule a home visit to complete SCAT application and assess needs.

## 2014-09-30 ENCOUNTER — Other Ambulatory Visit: Payer: Self-pay | Admitting: *Deleted

## 2014-09-30 ENCOUNTER — Ambulatory Visit: Payer: Medicare HMO | Admitting: *Deleted

## 2014-09-30 VITALS — BP 130/80 | HR 63 | Resp 16 | Ht 62.0 in | Wt 148.0 lb

## 2014-09-30 DIAGNOSIS — E119 Type 2 diabetes mellitus without complications: Secondary | ICD-10-CM

## 2014-10-01 ENCOUNTER — Encounter: Payer: Self-pay | Admitting: *Deleted

## 2014-10-01 NOTE — Patient Outreach (Signed)
[facility]) Care Management   10/01/2014  Brooke SalvoSandra H Diffee 07/15/1945 [id number]  Brooke Davidson is an 69 y.o. female  Subjective:  Pt reports she is loosing weight but don't know why, states taking Boost.  Pt states she is scheduled  For left knee replacement in June, using a cane to get around (need a better cane).  Pt states she is wearing knee braces, gets out of breath when walking.  Pt states pain in both knees is +9, pain med not working.   Pt states she has not  Been checking her blood sugars, does not know how to use her new glucometer.  Pt states she does not know what to eat  on a diabetic diet, needs to see a nutritionist.  Pt state she  plans on joining the [employer], has silver sneakers.  Pt states she also needs a dermatologist for her itching, was seeing one in [place], no OTC med works.    Objective:   Filed Vitals:   09/30/14 1050  BP: 130/80  Pulse: 63  Resp: 16   ROS  Physical Exam  Constitutional: She is oriented to person, place, and time. She appears well-developed and well-nourished.  Respiratory: Breath sounds normal.  GI: Soft.  Neurological: She is alert and oriented to person, place, and time.  Skin: Skin is warm and dry.  Psychiatric: She has a normal mood and affect.    Current Medications:   Current Outpatient Prescriptions  Medication Sig Dispense Refill  . acetaminophen (TYLENOL) 500 MG tablet Take 1,000 mg by mouth every 6 (six) hours as needed.    Marland Kitchen. albuterol (PROVENTIL HFA;VENTOLIN HFA) 108 (90 BASE) MCG/ACT inhaler Inhale into the lungs every 6 (six) hours as needed for wheezing or shortness of breath.    . ASPIRIN LOW DOSE 81 MG EC tablet Take 81 mg by mouth daily.   11  . budesonide-formoterol (SYMBICORT) 80-4.5 MCG/ACT inhaler Inhale 2 puffs into the lungs 2 (two) times daily.    Marland Kitchen. desoximetasone (TOPICORT) 0.05 % cream Apply 1 application topically 2 (two) times daily as needed.     . diclofenac (VOLTAREN) 75 MG EC tablet  Take 75 mg by mouth 2 (two) times daily.    Marland Kitchen. latanoprost (XALATAN) 0.005 % ophthalmic solution Place 1 drop into both eyes at bedtime.   12  . levocetirizine (XYZAL) 2.5 MG/5ML solution Take 5 mg by mouth at bedtime as needed for allergies.     . metFORMIN (GLUCOPHAGE) 500 MG tablet Take 500 mg by mouth 2 (two) times daily with a meal.    . Olopatadine HCl 0.2 % SOLN Apply 1 drop to eye.    Marland Kitchen. omeprazole (PRILOSEC) 40 MG capsule Take 40 mg by mouth daily.    . pravastatin (PRAVACHOL) 40 MG tablet Take 80 mg by mouth daily.     . pregabalin (LYRICA) 50 MG capsule Take 50 mg by mouth 2 (two) times daily.    . traMADol (ULTRAM) 50 MG tablet Take 1-2 tablets (50-100 mg total) by mouth every 6 (six) hours as needed. 60 tablet 0  . valsartan (DIOVAN) 160 MG tablet Take 1 tablet (160 mg total) by mouth daily. 30 tablet 0  . vitamin E 100 UNIT capsule Take by mouth daily.    Marland Kitchen. zolpidem (AMBIEN) 10 MG tablet Take 10 mg by mouth at bedtime as needed.    . baclofen (LIORESAL) 10 MG tablet Take 10 mg by mouth 3 (three) times daily.     .Marland Kitchen  ENSURE PLUS (ENSURE PLUS) LIQD Take 237 mLs by mouth.    Marland Kitchen OVER THE COUNTER MEDICATION Take 1 tablet by mouth as needed (heartburn).     No current facility-administered medications for this visit.      Fall/Depression Screening:    PHQ 2/9 Scores 09/30/2014  PHQ - 2 Score 0    Assessment:    Health assessment done.  Pt historian.                            DM: education needed on diet, management, use of glucometer                           Pain:  Bilateral knees.  Needs Pain management MD.                             Impaired mobility due to bilateral knees    Plan:  Pt to call MD, request referral for MD for pain managemen as well as Dermatologist and Nutritionist            Pt to call MD, request prescription for quad cane.             RN CM to follow pt with community case management (home visits) with next scheduled visit 5/20.             Plan to inform  MD of THN involvement.  [facility] CM Care Plan        Patient Outreach from 09/30/2014 in [facility]   Care Plan Problem One  DM-  not knowing  what to eat on diabetic diet    Care Plan for Problem One  Active   Interventions for Problem One Long Term Goal  -- [Provided pt with EMMI (Diabetes diet- type 2), reviewed food groups to choose from. ]   [facility] Long Term Goal (31-90 days)  Pt would know (verbalize)what to eat on  diabetic diet in the next 90 days    [facility] Long Term Goal Start Date  09/30/14   [facility] CM Short Term Goal #1 (0-30 days)  Pt would be able to verbalize the good carbs to choose from in the next 30 days    [facility] CM Short Term Goal #1 Start Date  09/30/14   Care Plan Problem Two  Pt not checking blood sugars    Care Plan for Problem Two  Active   [facility] CM Short Term Goal #1 (0-30 days)  Pt would check blood sugars three times a week/record for the next 30 days    [facility] CM Short Term Goal #1 Start Date  09/30/14     Shayne Alken.   Wynetta Seith RN CCM [facility] Care Management  [phone]

## 2014-10-03 ENCOUNTER — Other Ambulatory Visit: Payer: Self-pay | Admitting: *Deleted

## 2014-10-03 ENCOUNTER — Ambulatory Visit (AMBULATORY_SURGERY_CENTER): Payer: Self-pay | Admitting: *Deleted

## 2014-10-03 ENCOUNTER — Encounter: Payer: Self-pay | Admitting: *Deleted

## 2014-10-03 ENCOUNTER — Telehealth: Payer: Self-pay | Admitting: *Deleted

## 2014-10-03 VITALS — Ht 64.0 in | Wt 149.0 lb

## 2014-10-03 DIAGNOSIS — Z1211 Encounter for screening for malignant neoplasm of colon: Secondary | ICD-10-CM

## 2014-10-03 NOTE — Telephone Encounter (Addendum)
Pt called the front desk and was very upset and stated that she has [facility].  Pt stated that she came in February and requested that she see a woman provider but she saw a female.  She stated that she was not aware that the provider she was seeing was a female.  No one informed her the day of the appt that she was seeing a female.  She said " I was sitting on the exam table in that gown and all of a sudden a man comes in; I was shocked but I went along with it."  Looking at pt's appts with Erie NoeVanessa it shows that the pt was scheduled with a woman provider on 07/14/14 in which the pt canceled and then she rescheduled for 07/17/14, that appt was also scheduled with a woman provider.  I explained to the patient that she also canceled that appt as well.  I advised to pt that she may have been scheduled with the female due to getting you in for your appt.  Pt stated "o, okay, I understand, I appreciate you helping me".  After explained to the patient pt was very calm and appreciate.  I gave pt her results and informed her to f/u with a pap in one year.  Pt stated understanding.

## 2014-10-03 NOTE — Telephone Encounter (Signed)
Patient here in pre-visit, she states, had colonoscopy before in [place] with Dr.Carlton Neva SeatGreene and thinks it was done about 3-5 years ago. Patient states it was normal. Patient denies any GI concerns or family hx colon cancer. Colonoscopy cancelled. Patient signed release of information form and this was given to Dr.Brodie's CMA, Delsa BernKathy Mavarkis. Once Dr.Brodie reviews records, Patient needs to be called and told when colonoscopy is due and she will need new pre-visit before exam. Thanks!

## 2014-10-03 NOTE — Telephone Encounter (Signed)
Pt left message stating that this is her 3rd call to us in an effort to get Pap results from February. She is upset and wants a call back today. Per chart review, no recent calls from pt are documented. She does however have documentation that she was notified of her Pap results on 2/25 by Kyung Ruddaylor Campbell, RN who spoke with her by phone. Documentation states that pt voice understanding of Pap results. I called pt and left message stating that I am returning her call and sorry she is upset and frustrated. We spoke with her 3 days after her Pap test and will be happy to review the results again with her. She may call back and leave a new message stating if we may leave detailed medical information on her voice mail.

## 2014-10-03 NOTE — Progress Notes (Signed)
Patient denies any allergies to eggs or soy. Patient denies any problems with anesthesia/sedation. Patient denies any oxygen use at home and does not take any diet/weight loss medications. Patient states she had colonoscopy about 3-5 years ago, unsure when, was done by [facility] [place]. Release of information signed and phone note [facility]athy Mavarkis,CMA for Dr.Brodie. Colonoscopy cancelled at this time. Patient will need new pre-visit to go over instructions, was not sure she would understand "blank" instructions.

## 2014-10-03 NOTE — Patient Outreach (Signed)
[facility]) Care Management  [facility]  10/03/2014  Brooke Davidson Sep 29, 1945 [id number]  Subjective:    Patient lacks transportation to and from physician appointments.  Current Medications:  Current Outpatient Prescriptions  Medication Sig Dispense Refill  . acetaminophen (TYLENOL) 500 MG tablet Take 1,000 mg by mouth every 6 (six) hours as needed.    Marland Kitchen. albuterol (PROVENTIL HFA;VENTOLIN HFA) 108 (90 BASE) MCG/ACT inhaler Inhale into the lungs every 6 (six) hours as needed for wheezing or shortness of breath.    . ASPIRIN LOW DOSE 81 MG EC tablet Take 81 mg by mouth daily.   11  . baclofen (LIORESAL) 10 MG tablet Take 10 mg by mouth 3 (three) times daily.     . budesonide-formoterol (SYMBICORT) 80-4.5 MCG/ACT inhaler Inhale 2 puffs into the lungs 2 (two) times daily.    Marland Kitchen. desoximetasone (TOPICORT) 0.05 % cream Apply 1 application topically 2 (two) times daily as needed.     . diclofenac (VOLTAREN) 75 MG EC tablet Take 75 mg by mouth 2 (two) times daily.    Marland Kitchen. ENSURE PLUS (ENSURE PLUS) LIQD Take 237 mLs by mouth.    . latanoprost (XALATAN) 0.005 % ophthalmic solution Place 1 drop into both eyes at bedtime.   12  . levocetirizine (XYZAL) 2.5 MG/5ML solution Take 5 mg by mouth at bedtime as needed for allergies.     . metFORMIN (GLUCOPHAGE) 500 MG tablet Take 500 mg by mouth 2 (two) times daily with a meal.    . Olopatadine HCl 0.2 % SOLN Apply 1 drop to eye.    Marland Kitchen. omeprazole (PRILOSEC) 40 MG capsule Take 40 mg by mouth daily.    Marland Kitchen. OVER THE COUNTER MEDICATION Take 1 tablet by mouth as needed (heartburn).    . pravastatin (PRAVACHOL) 40 MG tablet Take 80 mg by mouth daily.     . pregabalin (LYRICA) 50 MG capsule Take 50 mg by mouth 2 (two) times daily.    . traMADol (ULTRAM) 50 MG tablet Take 1-2 tablets (50-100 mg total) by mouth every 6 (six) hours as needed. 60 tablet 0  . valsartan (DIOVAN) 160 MG tablet Take 1 tablet (160 mg total) by mouth daily. 30  tablet 0  . vitamin E 100 UNIT capsule Take by mouth daily.    Marland Kitchen. zolpidem (AMBIEN) 10 MG tablet Take 10 mg by mouth at bedtime as needed.     No current facility-administered medications for this visit.    Functional Status:  In your present state of health, do you have any difficulty performing the following activities: 09/30/2014  Hearing? N  Vision? N  Difficulty concentrating or making decisions? N  Walking or climbing stairs? Y  Dressing or bathing? Y  Doing errands, shopping? Y  Preparing Food and eating ? N  Using the Toilet? N  In the past six months, have you accidently leaked urine? Y  Do you have problems with loss of bowel control? N  Managing your Medications? Y  Managing your Finances? Y  Housekeeping or managing your Housekeeping? Y    Fall/Depression Screening:  PHQ 2/9 Scores 09/30/2014  PHQ - 2 Score 0    Assessment:   CSW was able to make initial contact with patient today to perform phone assessment.  CSW introduced self, explained role and types of services provided through [facility].  CSW also explained the reason for the call, obtaining two HIPAA compliant identifiers from patient (name and date  of birth) before proceeding with the phone conversation.  CSW went on to explain to patient that CSW received a referral from Donato Schultz, Telephonic Nurse Case Manager with [facility], requesting that CSW assist patient with obtaining a blood pressure monitor and scales for home use.  In addition, [name] reported that patient is in need of assistance with transportation to physician appointments.  CSW is aware that patient is already working with Jodi Mourning, RNCM with [facility] for disease management services.  More specifically, [name] is educating patient on medication adherence, proper use of the glucose monitor she has been provided and the  importance of weighing daily, just to name a few.  Patient reported that she is currently using [employer] but that these services will run out at the end of April.  Patient was agreeable to having CSW assist her with completion of an application for [employer]).  Patient also inquired as to whether or not CSW would be able to assist her with purchasing a blood pressure monitor and scales, for home use, as patient admits that she is on a fixed income and unable to afford these types of equipment.  CSW explained to patient that CSW would be more than happy to assist patient with completion of an FAF (Financial Assessment Form) to see if patient qualifies for financial assistance through [employer] Care Management.  Patient agreed to meet with CSW for an initial home visit so that CSW could assist patient with completion of the FAF and SCAT applications.  Once the applications are completed and signed, CSW agreed to submit the SCAT application to the Department of Transportation for processing and the FAF application to management with [facility] for approval.  Plan:     CSW will meet with patient for the initial home visit on Friday, April 29th at 10:30am to assist with completion of applications for transportation assistance through [employer]) and [employer] (Financial Assessment Form) through [employer].   CSW will converse with patient's RNCM with [facility], Jodi Mourning to report findings of phone conversation with patient today.  [name], BSW, MSW, LCSW [facility] [address] [place], [address] [place], [place] [address] [email or web address] [phone]

## 2014-10-10 ENCOUNTER — Encounter: Payer: Self-pay | Admitting: *Deleted

## 2014-10-10 ENCOUNTER — Other Ambulatory Visit: Payer: Self-pay | Admitting: *Deleted

## 2014-10-10 NOTE — Patient Outreach (Signed)
[facility]) Care Management  [facility] Social Work  10/10/2014  Brooke Davidson 05/30/1946 [id number]  Subjective:    Objective:   Current Medications:  Current Outpatient Prescriptions  Medication Sig Dispense Refill  . acetaminophen (TYLENOL) 500 MG tablet Take 1,000 mg by mouth every 6 (six) hours as needed.    Marland Kitchen albuterol (PROVENTIL HFA;VENTOLIN HFA) 108 (90 BASE) MCG/ACT inhaler Inhale into the lungs every 6 (six) hours as needed for wheezing or shortness of breath.    . ASPIRIN LOW DOSE 81 MG EC tablet Take 81 mg by mouth daily.   11  . baclofen (LIORESAL) 10 MG tablet Take 10 mg by mouth 3 (three) times daily.     . budesonide-formoterol (SYMBICORT) 80-4.5 MCG/ACT inhaler Inhale 2 puffs into the lungs 2 (two) times daily.    Marland Kitchen desoximetasone (TOPICORT) 0.05 % cream Apply 1 application topically 2 (two) times daily as needed.     . diclofenac (VOLTAREN) 75 MG EC tablet Take 75 mg by mouth 2 (two) times daily.    Marland Kitchen ENSURE PLUS (ENSURE PLUS) LIQD Take 237 mLs by mouth.    . latanoprost (XALATAN) 0.005 % ophthalmic solution Place 1 drop into both eyes at bedtime.   12  . levocetirizine (XYZAL) 2.5 MG/5ML solution Take 5 mg by mouth at bedtime as needed for allergies.     . metFORMIN (GLUCOPHAGE) 500 MG tablet Take 500 mg by mouth 2 (two) times daily with a meal.    . Olopatadine HCl 0.2 % SOLN Apply 1 drop to eye.    Marland Kitchen omeprazole (PRILOSEC) 40 MG capsule Take 40 mg by mouth daily.    Marland Kitchen OVER THE COUNTER MEDICATION Take 1 tablet by mouth as needed (heartburn).    . pravastatin (PRAVACHOL) 40 MG tablet Take 80 mg by mouth daily.     . pregabalin (LYRICA) 50 MG capsule Take 50 mg by mouth 2 (two) times daily.    . traMADol (ULTRAM) 50 MG tablet Take 1-2 tablets (50-100 mg total) by mouth every 6 (six) hours as needed. 60 tablet 0  . valsartan (DIOVAN) 160 MG tablet Take 1 tablet (160 mg total) by mouth daily. 30 tablet 0  . vitamin E 100 UNIT capsule Take by mouth  daily.    Marland Kitchen zolpidem (AMBIEN) 10 MG tablet Take 10 mg by mouth at bedtime as needed.     No current facility-administered medications for this visit.    Functional Status:  In your present state of health, do you have any difficulty performing the following activities: 09/30/2014  Hearing? N  Vision? N  Difficulty concentrating or making decisions? N  Walking or climbing stairs? Y  Dressing or bathing? Y  Doing errands, shopping? Y  Preparing Food and eating ? N  Using the Toilet? N  In the past six months, have you accidently leaked urine? Y  Do you have problems with loss of bowel control? N  Managing your Medications? Y  Managing your Finances? Y  Housekeeping or managing your Housekeeping? Y    Fall/Depression Screening:  PHQ 2/9 Scores 09/30/2014  PHQ - 2 Score 0    Assessment:   CSW was scheduled to meet with patient today to perform the initial home visit; however, patient was not available at the time of CSW's arrival.  CSW knocked on patient's door several different times, as well as waited in the parking lot of her apartment complex for close to 30 minutes.  CSW also tried calling  patient on her phone several times but continued to receive her voicemail.  A HIPAA compliant message was left for patient, encouraging her to contact CSW at her earliest convenience to try and reschedule the initial home visit.  In the meantime, CSW contacted [facility]) @ # [phone], per patient's request.  DHS serves the [place] area, providing Medicaid recipients with incontinence supplies.  Patient wanted CSW to contact a representative with DHS to see if she could continue to receive services while residing in [place].  The answer was no, as patient is no longer a resident of [place], nor does patient have Medicaid coverage in [place] or [place].  Shortly thereafter, CSW was able to make contact with patient, who apologized for forgetting about the  scheduled visit.  Patient indicated that her cell phone was not working properly so she needed to take it to [employer] store to have it fixed.  Patient then went to her Primary Care Physician, Dr. Dorma Russell Abuser's office to pick up her completed and signed SCAT Midwife) application.  Patient requested that CSW submit her SCAT application to the Department of Transportation for processing, as patient believes that it will definitely be approved if submitted by CSW.  Patient's niece is currently transporting her to her physician appointments and patient is also receiving transportation assistance through [employer].  Patient does not have another scheduled physician visit until July 20th, according to patient's FR (electronic medical record) in [facility].  Patient made several requests of CSW while conversing over the phone.  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  Next, patient reported that she will be having hip surgery in July, requesting home care services to assist with activities of daily living, as well as someone to perform meal preparation, grocery shopping and light housekeeping duties.  Patient already has a motorized wheelchair that has never been used, purchased through Adult Medicaid while patient was residing in [place]. Patient indicated that she slipped and fell in her bathtub last evening, not injuring herself, but stating that she needs a tub bench and raised toilet seat for home use.  In addition, patient needs a blood pressure monitor and scales, which she has already requested of CSW.  CSW reminded patient that an FF (Financial Assessment Form) still needs to be completed in order to determine whether or not patient is eligible to receive financial assistance through [employer].  CSW was planning to perform the FF with patient during the home visit today, as patient does not wish to disclose her income, assets and debt over the phone, with good reason.  Patient indicated that she needed to look at her calendar to determine when she would be able to schedule another home visit with CSW.  CSW explained to patient that CSW would await a return call from patient with regards to the next scheduled home visit.  Plan:   CSW will follow-up with patient on Tuesday, May 3rd, if CSW does not receive a return call from patient in the meantime. CSW will contact patient's RNCM with [facility], Guinevere Ferrari to report findings of phone conversation with patient today. CSW will fax a correspondence letter to patient's Primary Care Physician, Dr. Tandy Gaw to ensure that Dr. Talmadge Chad is aware of CSW's involvement with patient.  [employer], BSW,  MSW, LCSW [facility] Care Management [address], [address] [place], [place] [address] [email or web address] [phone]

## 2014-10-14 ENCOUNTER — Encounter: Payer: Self-pay | Admitting: *Deleted

## 2014-10-14 ENCOUNTER — Encounter: Payer: Medicare HMO | Admitting: Internal Medicine

## 2014-10-14 ENCOUNTER — Telehealth: Payer: Self-pay | Admitting: *Deleted

## 2014-10-14 ENCOUNTER — Other Ambulatory Visit: Payer: Self-pay | Admitting: *Deleted

## 2014-10-14 NOTE — Patient Outreach (Signed)
[facility]) Care Management  [facility]  10/14/2014  Brooke SalvoSandra H Davidson 09/08/1945 [id number]   Current Medications:  Current Outpatient Prescriptions  Medication Sig Dispense Refill  . acetaminophen (TYLENOL) 500 MG tablet Take 1,000 mg by mouth every 6 (six) hours as needed.    Marland Kitchen. albuterol (PROVENTIL HFA;VENTOLIN HFA) 108 (90 BASE) MCG/ACT inhaler Inhale into the lungs every 6 (six) hours as needed for wheezing or shortness of breath.    . ASPIRIN LOW DOSE 81 MG EC tablet Take 81 mg by mouth daily.   11  . baclofen (LIORESAL) 10 MG tablet Take 10 mg by mouth 3 (three) times daily.     . budesonide-formoterol (SYMBICORT) 80-4.5 MCG/ACT inhaler Inhale 2 puffs into the lungs 2 (two) times daily.    Marland Kitchen. desoximetasone (TOPICORT) 0.05 % cream Apply 1 application topically 2 (two) times daily as needed.     . diclofenac (VOLTAREN) 75 MG EC tablet Take 75 mg by mouth 2 (two) times daily.    Marland Kitchen. ENSURE PLUS (ENSURE PLUS) LIQD Take 237 mLs by mouth.    . latanoprost (XALATAN) 0.005 % ophthalmic solution Place 1 drop into both eyes at bedtime.   12  . levocetirizine (XYZAL) 2.5 MG/5ML solution Take 5 mg by mouth at bedtime as needed for allergies.     . metFORMIN (GLUCOPHAGE) 500 MG tablet Take 500 mg by mouth 2 (two) times daily with a meal.    . Olopatadine HCl 0.2 % SOLN Apply 1 drop to eye.    Marland Kitchen. omeprazole (PRILOSEC) 40 MG capsule Take 40 mg by mouth daily.    Marland Kitchen. OVER THE COUNTER MEDICATION Take 1 tablet by mouth as needed (heartburn).    . pravastatin (PRAVACHOL) 40 MG tablet Take 80 mg by mouth daily.     . pregabalin (LYRICA) 50 MG capsule Take 50 mg by mouth 2 (two) times daily.    . traMADol (ULTRAM) 50 MG tablet Take 1-2 tablets (50-100 mg total) by mouth every 6 (six) hours as needed. 60 tablet 0  . valsartan (DIOVAN) 160 MG tablet Take 1 tablet (160 mg total) by mouth daily. 30 tablet 0  . vitamin E 100 UNIT capsule Take by mouth daily.    Marland Kitchen. zolpidem (AMBIEN) 10  MG tablet Take 10 mg by mouth at bedtime as needed.     No current facility-administered medications for this visit.    Functional Status:  In your present state of health, do you have any difficulty performing the following activities: 09/30/2014  Hearing? N  Vision? N  Difficulty concentrating or making decisions? N  Walking or climbing stairs? Y  Dressing or bathing? Y  Doing errands, shopping? Y  Preparing Food and eating ? N  Using the Toilet? N  In the past six months, have you accidently leaked urine? Y  Do you have problems with loss of bowel control? N  Managing your Medications? Y  Managing your Finances? Y  Housekeeping or managing your Housekeeping? Y    Fall/Depression Screening:  PHQ 2/9 Scores 09/30/2014  PHQ - 2 Score 0    Assessment:   CSW was able to make contact with patient today to follow-up regarding initiation of patient's FAF (Financial Assessment Form) to determine whether or not patient is eligible to receive financial assistance through [facility] to purchase a blood pressure monitor and scales for home use.  CSW first obtained two HIPAA compliant identifiers from patient, which included patient's name  and date of birth.  Patient indicated that today was not a good day for her to answer a lot of questions, as she was not feeling well and wanted to cut the call short.  CSW agreed to contact patient at 9:00am on Wednesday, May 4th to initiate the phone assessment. In the meantime, patient inquired about a tub bench and 3-in-1 toilet seat for home use.  CSW explained to patient that patient's insurance, Francine Graven does not cover the cost of bathroom equipment of any kind and that patient would be responsible for paying the expense out-of-pocket.  Patient was not interested in having CSW order any durable medical equipment at this time, as patient admits that she is on a fixed income and unable to afford any additional monthly expenses.   CSW was able to confirm that patient currently has a grab bar and safety mat in her shower to help prevent falls.  Plan:   CSW will contact patient on Wednesday, May 4th at 9:00am to complete FAF (Financial Assessment Form) to determine eligibility of financial assistance through [employer].  [name], BSW, MSW, LCSW [facility] [address] [place], [address] [place], [place] [address] Mardene [email or web address] [phone]

## 2014-10-14 NOTE — Telephone Encounter (Signed)
Spoke with patient and she wants to know if we have received the records from [place] yet. Per phone note on 10/03/14, the ROI was [place]. Please, advise if we have received the records.

## 2014-10-15 ENCOUNTER — Other Ambulatory Visit: Payer: Self-pay | Admitting: *Deleted

## 2014-10-15 ENCOUNTER — Encounter: Payer: Medicare HMO | Admitting: Internal Medicine

## 2014-10-15 NOTE — Telephone Encounter (Signed)
Have not received any records for patient. Will re-fax this morning to get records.

## 2014-10-15 NOTE — Telephone Encounter (Signed)
Have re-sent fax over to get paperwork on colonoscopy and pathology information for patient. Have not received any information yet. Have tried to call the office, but they were closed. Will try to call office again tomorrow (10/16/14) to get information.

## 2014-10-15 NOTE — Telephone Encounter (Signed)
Spoke with patient and told her we are trying to get her records. We have faxed the request again.

## 2014-10-15 NOTE — Patient Outreach (Signed)
[facility]) Care Management  10/15/2014  Glean SalvoSandra H Polhamus 07-18-1945 [id number]   CSW made an attempt to try and contact patient today to follow-up regarding completion of her Financial Assessment Form; however, patient was unavailable.  A HIPAA compliant message was left on voicemail.  CSW is currently awaiting a return call.  Danford BadJoanna Kenadee Gates, BSW, MSW, LCSW [facility] [address], [address] [place], [place] [address] Mardene [email or web address] [phone]

## 2014-10-17 NOTE — Telephone Encounter (Signed)
Received fax from Dr. Paralee CancelGreene's office. Last colonoscopy they had on file was from 2003. There is no pathology report with this. Dr. Neva SeatGreene has retired.

## 2014-10-21 ENCOUNTER — Other Ambulatory Visit: Payer: Self-pay | Admitting: *Deleted

## 2014-10-21 NOTE — Patient Outreach (Signed)
[facility]) Care Management  10/21/2014  Brooke SalvoSandra H Davidson [birth year] [id number]   CSW made a second attempt to try and contact patient today to follow-up with social work services and resources; however, patient was unavailable at the time of CSW's call.  CSW left a HIPAA  Compliant message for patient on voicemail and is currently awaiting a return call.  CSW will make a third attempt to try and contact patient on Tuesday, May 17th, if CSW does not receive a return call from patient in the meantime.  Danford BadJoanna Saporito, BSW, MSW, LCSW [facility] [address], [address] [place], [place] [address] Mardene [email or web address] [phone]

## 2014-10-23 ENCOUNTER — Encounter: Payer: Self-pay | Admitting: *Deleted

## 2014-10-23 NOTE — Patient Outreach (Signed)
[facility]) Care Management  10/23/2014  Glean SalvoSandra H Duthie Nov 29, 1945 [id number]    Documentation:  RN CM added to patient  care Team.      Shayne Alkenose M.   Pierzchala RN CCM [facility] Care [facility]  [phone]

## 2014-10-28 ENCOUNTER — Encounter: Payer: Self-pay | Admitting: *Deleted

## 2014-10-28 ENCOUNTER — Other Ambulatory Visit: Payer: Self-pay | Admitting: *Deleted

## 2014-10-28 NOTE — Patient Outreach (Signed)
[facility]) Care Management  [facility]  10/28/2014  Brooke SalvoSandra H Tafoya [birth year] [id number]  Subjective:    Patient lacks financial resources to purchase a blood pressure monitor and scales for home use.   Current Medications:  Current Outpatient Prescriptions  Medication Sig Dispense Refill  . acetaminophen (TYLENOL) 500 MG tablet Take 1,000 mg by mouth every 6 (six) hours as needed.    Marland Kitchen. albuterol (PROVENTIL HFA;VENTOLIN HFA) 108 (90 BASE) MCG/ACT inhaler Inhale into the lungs every 6 (six) hours as needed for wheezing or shortness of breath.    . ASPIRIN LOW DOSE 81 MG EC tablet Take 81 mg by mouth daily.   11  . baclofen (LIORESAL) 10 MG tablet Take 10 mg by mouth 3 (three) times daily.     . budesonide-formoterol (SYMBICORT) 80-4.5 MCG/ACT inhaler Inhale 2 puffs into the lungs 2 (two) times daily.    Marland Kitchen. desoximetasone (TOPICORT) 0.05 % cream Apply 1 application topically 2 (two) times daily as needed.     . diclofenac (VOLTAREN) 75 MG EC tablet Take 75 mg by mouth 2 (two) times daily.    Marland Kitchen. ENSURE PLUS (ENSURE PLUS) LIQD Take 237 mLs by mouth.    . latanoprost (XALATAN) 0.005 % ophthalmic solution Place 1 drop into both eyes at bedtime.   12  . levocetirizine (XYZAL) 2.5 MG/5ML solution Take 5 mg by mouth at bedtime as needed for allergies.     . metFORMIN (GLUCOPHAGE) 500 MG tablet Take 500 mg by mouth 2 (two) times daily with a meal.    . Olopatadine HCl 0.2 % SOLN Apply 1 drop to eye.    Marland Kitchen. omeprazole (PRILOSEC) 40 MG capsule Take 40 mg by mouth daily.    Marland Kitchen. OVER THE COUNTER MEDICATION Take 1 tablet by mouth as needed (heartburn).    . pravastatin (PRAVACHOL) 40 MG tablet Take 80 mg by mouth daily.     . pregabalin (LYRICA) 50 MG capsule Take 50 mg by mouth 2 (two) times daily.    . traMADol (ULTRAM) 50 MG tablet Take 1-2 tablets (50-100 mg total) by mouth every 6 (six) hours as needed. 60 tablet 0  . valsartan (DIOVAN) 160 MG tablet Take 1 tablet (160 mg  total) by mouth daily. 30 tablet 0  . vitamin E 100 UNIT capsule Take by mouth daily.    Marland Kitchen. zolpidem (AMBIEN) 10 MG tablet Take 10 mg by mouth at bedtime as needed.     No current facility-administered medications for this visit.    Functional Status:  In your present state of health, do you have any difficulty performing the following activities: 09/30/2014  Hearing? N  Vision? N  Difficulty concentrating or making decisions? N  Walking or climbing stairs? Y  Dressing or bathing? Y  Doing errands, shopping? Y  Preparing Food and eating ? N  Using the Toilet? N  In the past six months, have you accidently leaked urine? Y  Do you have problems with loss of bowel control? N  Managing your Medications? Y  Managing your Finances? Y  Housekeeping or managing your Housekeeping? Y    Fall/Depression Screening:  PHQ 2/9 Scores 09/30/2014  PHQ - 2 Score 0    Assessment:   CSW was able to make contact with patient today to follow-up regarding social work services and resources.  When CSW first called patient, patient indicated that she had an emergency call on the other line, immediately terminating the call with CSW.  Within the minute, patient returned CSW's call, apologizing for needing to hang up the phone so abruptly.  CSW inquired as to whether or not everything was okay with patient, wanting to ensure that patient was not in any type of danger or needing medical assistance.  Patient denied, reporting that she was just trying to get in contact with her Primary Care Physician, Dr. Dorma Russell Avbuere's office to try and refill a prescription for [facility]  Patient went on to say that she cannot find her prescription for [facility], written by Dr. Concepcion Elk.  After thorough review of patient's EMR (Electronic Medical Record) in EPIC, CSW noted the following information regarding patient's prescription for Pregabalin (LYRICA) 50 MG capsule 50 mg, 2 times daily Summary: Take 50 mg by mouth 2 (two) times  daily. Dose, Route, Frequency: 50 mg, Oral, 2 times daily  Ord/Sold: 04/17/2014 (O)  Note: Pt plans to start taking it again. (Written 08/24/2014 1534) Authorized by: PROVIDER, HISTORICAL CSW explained to patient that CSW is unable to locate in patient's EMR where LYRICA has been prescribed for patient by Dr. Concepcion Elk, as the last prescription written for [facility] was ordered by patient's primary care physician when she was living in [place].  Patient admits that she was able to find a bottle for [facility], written by her previous primary care physician, but that the bottle of LYRICA in her possession expired January 26, 2014.  CSW agreed to pass this information along to [employer] and/or [employer], Pharmacists with [facility] CM) [facility] Care Management for review.  CSW also agreed to notify patient's RNCM with [facility] CM, Rose Pierzchala. Patient voiced a great deal of concerns regarding her health and fears that she is currently being "mismanaged".  CSW encouraged patient to take her concerns directly to the source, as the medical provider that patient is referring too may not even be aware of patient's unhappiness.  Patient did not feel it necessary to speak with the provider any further, requesting information about a health & [place] similar to the one in which she received care in [place].  CSW provided patient with the following information: [facility] [address] [place], [place] [address] Hours of Operation Mon - Fri: 9 a.m. - 6 p.m. Main: [phone]  CSW also provided patient with a list of services offered at the clinic, also agreeing to mail patient a more detailed description.  These services included all of the following: A "medical home" for adults needing healthcare when it's not an emergency  Chronic disease management  Disease prevention, diagnosis and treatment  Onsite point-of-care laboratory  testing  Health education and prevention programs  Physicals and immunizations  On-site pharmacy Same-day appointments available. Call [phone] to schedule. CSW went on to explain to patient that she can receive services through an internal medicine physician and that the internist can provide comprehensive, individualized care for whatever medical concern she's facing-from a minor injury to a complex chronic health condition. This is commonly referred to as "doctors for adults," internists focus on helping older teens and adults of all ages feel their best. Additional information provided to patient included: Coordinated, Patient-Centered Care Your [facility]) doctor of internal medicine gets to know you-your preferences, your beliefs and your cultural values. That way, you can work together to choose care that fits your medical needs and your lifestyle. Following the medical home model, your [facility] internist serves as your "home  base" for care. You can count on safe, consistent care because your physician will stay in touch with your specialists. With [facility], your internist and other [facility] specialists have secure, immediate access to your medical record so they will always have a current, complete picture of your health. General Internal Medicine Services Think of your [facility] internist as your go-to source for prevention, early detection, and treatment or management of medical and health issues. See your provider for: Regular checkups and screenings  Immunizations, including flu vaccines  Employment physicals  Health education and healthy lifestyle counseling  Diagnosis and treatment of illnesses and injuries  Management of chronic health conditions  Medication management  Women's care, including Pap smears and menopause management  Men's care, including PSA tests  Referrals to specialty care Care Where & When You Need It With more than 80 internal medicine  providers in eight practices across five [place] counties, you will find a [facility] practitioner close to where you live or work. When you experience an illness or injury, you benefit from quick access to your doctor. Many [facility] internists offer same-day sick care, and some offer Saturday appointments or extended weekday hours. The [facility] network also encompasses three conveniently located urgent care centers and six hospitals. If you are admitted to a [facility] hospital, a [facility] hospitalist-an internist or family medicine doctor who works exclusively in hospitals-will partner with your primary care provider to coordinate your care. See our locations directory to learn more about each office's location, hours and services. Accessible Health Care If you are underinsured-meaning you don't have enough health benefits to cover your medical costs-find financial assistance to get the care you need at [facility]. Patient reported that she would really like to be seen by a female physician, so CSW provided patient with a complete listing of all female physicians and nurse practitioners within [facility].  CSW explained to patient that CSW is not able to provide recommendations, simply a list of resources, as patient admitted that she would like to continue to be seen by a female physician within the network.   CSW inquired about patient's application for [employer] Mid[employer](Specialized Community Area Transportation) and whether or not patient has received a follow-up call from a representative with the Department of Transportation.  Patient indicated that she has been approved, she is just awaiting a phone assessment and then a home assessment, to complete the application process.  In the meantime, patient indicated that she will continue to have her niece drive her to and from her physician appointments because her niece currently has her car, but patient is still paying  $60.00 per month for car insurance through [employer]. CSW and patient were finally able to complete patient's FAF (Financial Assistance Form) to determine whether or not patient is eligible to receive financial assistance to purchase a blood pressure monitor and scales, for home use, through [employer]([facility] CM).  Patient's monthly expenses far exceed patient's monthly Social Security Income, according to patient.  Patient reports that she continues to have to borrow money from her daughter, who is a single parent and trying to raise three children of her own.  Patient admits that she is short about $200-$250 each month, relying soley on her daughter for financial assistance.  Patient indicated that her sister is currently purchasing her Depends and Pads; otherwise, she reports she would be using "newspaper and paper towels for incontinence problems".   CSW will submit patient's  completed FAF, along with a supplies order form, to Livia Snellen, Chiropodist with [facility] CM), for review and approval of a blood pressure monitor and scales for patient for home use, at the expense of [facility] CM.  CSW explained to patient that once the medical devices are approved and ordered, as well as delivered to the main [facility] CM office, CSW will contact patient to make arrangements to deliver the devices to patient's home at a time that is convenient for her.  Patient voiced understanding and was agreeable to this plan.  Patient continues to request additional DME (Durable Medical Equipment) from CSW, which includes a raised stool, shower chair, tub bench and toilet seat.  However, CSW has already explained to patient, on several different occasions, that insurance does not cover these types of equipment, or any bathroom equipment, and that she would be responsible for paying this expense out of pocket.  CSW further explained to patient that patient is able to pick up any of the DME items from [employer] and/or most any other medical supply  company.    Plan:   CSW will contact patient to schedule an initial home visit to deliver the blood pressure monitor and scales, for home use, once they arrive at the main [facility] office.  [name], BSW, MSW, LCSW [facility] [address] [place], [address] [place], [place] [address] Mardene [email or web address] [phone]

## 2014-10-31 ENCOUNTER — Other Ambulatory Visit: Payer: Self-pay | Admitting: *Deleted

## 2014-10-31 ENCOUNTER — Encounter: Payer: Self-pay | Admitting: *Deleted

## 2014-10-31 ENCOUNTER — Ambulatory Visit: Payer: Medicare HMO | Admitting: *Deleted

## 2014-10-31 VITALS — BP 140/78 | HR 62 | Resp 16 | Ht 63.0 in | Wt 154.0 lb

## 2014-10-31 DIAGNOSIS — E119 Type 2 diabetes mellitus without complications: Secondary | ICD-10-CM

## 2014-10-31 NOTE — Patient Outreach (Signed)
[facility]) Care Management  [facility] Social Work  10/31/2014  Brooke Davidson [birth year] [id number]   Current Medications:  Current Outpatient Prescriptions  Medication Sig Dispense Refill  . acetaminophen (TYLENOL) 500 MG tablet Take 1,000 mg by mouth every 6 (six) hours as needed.    Marland Kitchen albuterol (PROVENTIL HFA;VENTOLIN HFA) 108 (90 BASE) MCG/ACT inhaler Inhale into the lungs every 6 (six) hours as needed for wheezing or shortness of breath.    . ASPIRIN LOW DOSE 81 MG EC tablet Take 81 mg by mouth daily.   11  . baclofen (LIORESAL) 10 MG tablet Take 10 mg by mouth 3 (three) times daily.     . budesonide-formoterol (SYMBICORT) 80-4.5 MCG/ACT inhaler Inhale 2 puffs into the lungs 2 (two) times daily.    Marland Kitchen desoximetasone (TOPICORT) 0.05 % cream Apply 1 application topically 2 (two) times daily as needed.     . diclofenac (VOLTAREN) 75 MG EC tablet Take 75 mg by mouth 2 (two) times daily.    Marland Kitchen ENSURE PLUS (ENSURE PLUS) LIQD Take 237 mLs by mouth.    . latanoprost (XALATAN) 0.005 % ophthalmic solution Place 1 drop into both eyes at bedtime.   12  . levocetirizine (XYZAL) 2.5 MG/5ML solution Take 5 mg by mouth at bedtime as needed for allergies.     . metFORMIN (GLUCOPHAGE) 500 MG tablet Take 500 mg by mouth 2 (two) times daily with a meal.    . Olopatadine HCl 0.2 % SOLN Apply 1 drop to eye.    Marland Kitchen omeprazole (PRILOSEC) 40 MG capsule Take 40 mg by mouth daily.    Marland Kitchen OVER THE COUNTER MEDICATION Take 1 tablet by mouth as needed (heartburn).    . pravastatin (PRAVACHOL) 40 MG tablet Take 80 mg by mouth daily.     . pregabalin (LYRICA) 50 MG capsule Take 50 mg by mouth 2 (two) times daily.    . traMADol (ULTRAM) 50 MG tablet Take 1-2 tablets (50-100 mg total) by mouth every 6 (six) hours as needed. 60 tablet 0  . valsartan (DIOVAN) 160 MG tablet Take 1 tablet (160 mg total) by mouth daily. 30 tablet 0  . vitamin E 100 UNIT capsule Take by mouth daily.    Marland Kitchen zolpidem (AMBIEN) 10  MG tablet Take 10 mg by mouth at bedtime as needed.     No current facility-administered medications for this visit.    Functional Status:  In your present state of health, do you have any difficulty performing the following activities: 10/28/2014 09/30/2014  Hearing? N N  Vision? N N  Difficulty concentrating or making decisions? N N  Walking or climbing stairs? N Y  Dressing or bathing? N Y  Doing errands, shopping? N Y  Conservation officer, nature and eating ? N N  Using the Toilet? N N  In the past six months, have you accidently leaked urine? N Y  Do you have problems with loss of bowel control? N N  Managing your Medications? N Y  Managing your Finances? N Y  Housekeeping or managing your Housekeeping? N Y    Fall/Depression Screening:  PHQ 2/9 Scores 10/28/2014 09/30/2014  PHQ - 2 Score 0 0    Assessment:   CSW was able to make contact with patient today to explain that patient has been approved for financial assistance through [facility]) to receive a blood pressure monitor and scales for home use.  Patient was most appreciative, as CSW is  aware that patient is on a very fixed income, having been eligible to receive Adult Medicaid coverage while residing in [place], [place].  CSW went on to explain that patient's RNCM with [facility] CM, [name] will instruct patient on proper use of the medical equipment when she meets with patient for a routine home visit today (Friday, May 20th) at 11:00am.     CSW then inquired as to whether or not patient has received word from her Case Worker at the [facility] regarding the status of her Adult Medicaid application.  Patient admitted that she has not, but realizes that the process can take 45-60 days.  Patient indicated that she is still struggling with paying for Depends, despite a list of resources that CSW provided to her that offer discounted rates.  Patient is aware that all  durable medical equipment that she will need after her surgical procedure will be ordered for her prior to her returning home, while still residing in the skilled nursing facility where she will receive short-term rehabilitative services.  Patient admitted that she is unable to identify any additional social work needs at the present time, requesting to keep CSW's contact information in the event that something changes.  CSW encouraged patient to contact CSW directly if social work needs arise in the near future.  Otherwise, CSW explained to patient that CSW will be closing patient's case.  However, CSW was able to ensure that patient is aware that she will continue to receive disease management services through Mrs. Veleta Miners through [facility] CM.  Patient voiced understanding and was agreeable to this plan.  Plan:   CSW will perform a case closure on patient, as no additional social work needs have been identified at this time and all goals of treatment have been met from social work standpoint. CSW will fax a correspondence letter to patient's Primary Care Physician, Dr. Nolene Ebbs to ensure that Dr. Jeanie Cooks is aware of CSW's involvement with patient. CSW will converse with patient's RNCM with [facility], [name] to inform her of CSW's plans to close patient's case. CSW will submit a case closure request to Lurline Del, Care Management Assistant with [facility], in the form of an [employer].  Nat Christen, BSW, MSW, [facility] [place], [place], [place] [address] [email or web address] [phone]

## 2014-10-31 NOTE — Patient Outreach (Signed)
[facility]) Care Management  [facility] Care Manager  10/31/2014   Brooke Davidson 18-Oct-1945 [id number]  Subjective:  Pt states she saw Dr. Concepcion Elk 5/6, MD won't give her something for pain.  Pt states MD Did prescribe  Meloxicam, not working.   Pt states she is changing doctors, called [employer] to see what MD office has walk in, had that in [place]- was told [facility] does.  Pt states she wants to go to pain management, does not want strong pain medication.  Pt states she is scheduled to have surgery on left knee 7/25.     Objective: lungs clear.  Blood sugar today 127.  Per pt's glucometer, checked blood sugar once since RN CM's last home visit- 131.  Today pt checked during home visit, result was 127 (had a bagel with butter >hour earlier).   Filed Vitals:   10/31/14 1222  BP: 140/78  Pulse: 62  Resp: 16     Current Medications:  Current Outpatient Prescriptions  Medication Sig Dispense Refill  . albuterol (PROVENTIL HFA;VENTOLIN HFA) 108 (90 BASE) MCG/ACT inhaler Inhale into the lungs every 6 (six) hours as needed for wheezing or shortness of breath.    . ASPIRIN LOW DOSE 81 MG EC tablet Take 81 mg by mouth daily.   11  . budesonide-formoterol (SYMBICORT) 80-4.5 MCG/ACT inhaler Inhale 2 puffs into the lungs 2 (two) times daily.    Marland Kitchen latanoprost (XALATAN) 0.005 % ophthalmic solution Place 1 drop into both eyes at bedtime.   12  . levocetirizine (XYZAL) 2.5 MG/5ML solution Take 5 mg by mouth at bedtime as needed for allergies.     . meloxicam (MOBIC) 7.5 MG tablet Take 7.5 mg by mouth 3 times daily with meals, bedtime and 2 AM.    . metFORMIN (GLUCOPHAGE) 500 MG tablet Take 500 mg by mouth 2 (two) times daily with a meal.    . omeprazole (PRILOSEC) 40 MG capsule Take 40 mg by mouth daily.    . pravastatin (PRAVACHOL) 40 MG tablet Take 80 mg by mouth daily.     . pregabalin (LYRICA) 50 MG capsule Take 50 mg by mouth 2 (two) times daily.    .  valsartan (DIOVAN) 160 MG tablet Take 1 tablet (160 mg total) by mouth daily. 30 tablet 0  . zolpidem (AMBIEN) 10 MG tablet Take 10 mg by mouth at bedtime as needed.    Marland Kitchen acetaminophen (TYLENOL) 500 MG tablet Take 1,000 mg by mouth every 6 (six) hours as needed.    . baclofen (LIORESAL) 10 MG tablet Take 10 mg by mouth 3 (three) times daily.     Marland Kitchen desoximetasone (TOPICORT) 0.05 % cream Apply 1 application topically 2 (two) times daily as needed.     . diclofenac (VOLTAREN) 75 MG EC tablet Take 75 mg by mouth 2 (two) times daily.    Marland Kitchen ENSURE PLUS (ENSURE PLUS) LIQD Take 237 mLs by mouth.    . Olopatadine HCl 0.2 % SOLN Apply 1 drop to eye.    Marland Kitchen OVER THE COUNTER MEDICATION Take 1 tablet by mouth as needed (heartburn).    . traMADol (ULTRAM) 50 MG tablet Take 1-2 tablets (50-100 mg total) by mouth every 6 (six) hours as needed. (Patient not taking: Reported on 10/31/2014) 60 tablet 0  . vitamin E 100 UNIT capsule Take by mouth daily.     No current facility-administered medications for this visit.      Assessment: DM-  Pt not checking sugars, only one recording in glucometer, today sugar 127.                        Chronic pain- reports starts at both knees- goes to hips- pt scheduled to have surgery                         On left knee 7/25.  MD change- pt called [place] during home                          Visit- was told not taking new patients until 6/6- pt to call back then.   Plan:   Scale provided by [facility]- RN CM demonstrated use, pt  Demonstrated back.                  pt to weigh twice a week, record.               HTN-  BP machine provided by [facility], RN CM demonstrated use, pt demonstrated back- pt to                        Start checking every other day/record.                Pt to start back checking blood sugars 3 days a week/record/provide results to MD.              Pt to call [place] 6/6, schedule appointment to see new Primary Care MD               RN CM to continue to provide ongoing community nurse case management services, f/u again 6/20              RN CM to update care plan at next home visit as needed.               [facility] CM Care Plan Problem One        Patient Outreach from 10/31/2014 in [employer] Most recent reading at 10/31/2014  3:26 PM   [facility] CM Short Term Goal #1 (0-30 days)  Pt would be able to verbalize the good carbs to choose from in the next 30 days    [facility] CM Short Term Goal #1 Start Date  09/30/14   [facility] CM Short Term Goal #1 Met Date  10/31/14    [facility] CM Care Plan Problem Two        Patient Outreach from 10/31/2014 in [place] Problem Two  Pt not checking blood sugars    Care Plan for Problem Two  Active   [facility] CM Short Term Goal #1 (0-30 days)  Pt would check blood sugars three times a week/record for the next 30 days    [facility] CM Short Term Goal #1 Start Date  09/30/14   [facility] CM Short Term Goal #1 Met Date   -- [goal not met ]   Interventions for Short Term Goal #2   -- [5/20- reviewed with pt importance of checking blood sugars, record. ]   [facility] CM Short Term Goal #2 (0-30 days)  -- Buddy [employer] 10/31/14 ]   [facility] CM Short Term Goal #2 Start Date  10/31/14   Interventions for Short Term Goal #2  Reviewed with pt importance  of checking blood sugars/record.     [facility] CM Care Plan Problem Three        Patient Outreach Telephone from 10/31/2014 in [place] Problem Three  Lack of transportation to physician appointments.   Care Plan for Problem Three  Active   [facility] CM Short Term Goal #1 (0-30 days)  CSW will assist patient with completion of a SCAT Paramedic) to assist patient with transport to all physician appointments, within the next two weeks.   [facility] CM Short Term Goal #1 Start Date  10/03/14   [facility] CM Short Term Goal #1 Met Date  10/10/14   Interventions for Short Term Goal #1  CSW will schedule a home visit with  patient to assist with completion of a SCAT Paramedic).   [facility] CM Short Term Goal #2 (0-30 days)  Once SCAT Paramedic) application is completed, CSW will submit to the Department of Transportation for processing, within the next 30 days.   [facility] CM Short Term Goal #2 Start Date  10/03/14   [facility] CM Short Term Goal #2 Met Date  10/10/14   Interventions for Short Term Goal #2  Patient is aware that a representative from the Department of Transportation will call her directly to discuss eligibility of services through [employer], once the application is submitted for processing.       Zara Chess.   [place] Care Management  [phone]

## 2014-11-03 ENCOUNTER — Ambulatory Visit: Payer: Medicare HMO | Admitting: *Deleted

## 2014-11-03 ENCOUNTER — Other Ambulatory Visit: Payer: Self-pay | Admitting: *Deleted

## 2014-11-03 NOTE — Patient Outreach (Signed)
[facility] that [facility] could not find [facility].   Pt states she wanted RN CM to know could not find the batteries (for scale) that were misplaced during home visit.   RN CM did provide pt with additional batteries for scale last week.  Pt reports scale is working.   RN CM to f/u with pt, next home visit 6/20.     Shayne Alkenose M.   Pierzchala RN CCM [facility] Care Management  [phone]

## 2014-11-03 NOTE — Patient Outreach (Signed)
[facility]) Care Management  11/03/2014  Brooke SalvoSandra H Schram [birth year] [id number]   Assigned [name], PharmD based on message from [facility] Hospital[facility]anna Saporitio, CSW.  Corrie MckusickLisa O. Sharlee BlewMoore, AABA [facility] Care Management [facility] CM Assistant Phone: [phone] Fax: [phone]

## 2014-11-04 ENCOUNTER — Other Ambulatory Visit: Payer: Self-pay

## 2014-11-04 NOTE — Patient Outreach (Signed)
I called Ms. Stegemann to set up an appointment to review her medications and her questions regarding them.  I will see her on Friday, Nov 07, 2014 at 11 am.    Steve Rattlerawn Falicia Lizotte, PharmD, [facility] SystemsBCACP [facility] Pharmacy Manager [phone]

## 2014-11-07 ENCOUNTER — Other Ambulatory Visit: Payer: Self-pay

## 2014-11-07 NOTE — Patient Outreach (Signed)
[facility]N) Care Management  [facility]   11/07/2014  Brooke Davidson May 09, 1946 [id number]  Subjective: Brooke Davidson is a 69 year old female who was referred to me by Brooke BadJoanna Saporito, LCSW in regards to her medications.  When I drove up to Brooke Davidson's apartment, she was walking outside to go to her neighbor's apartment.  She stated she was getting ready to go pickup a prescription and run some errands.  I asked if we could reschedule and she stated yes.  Once inside her apartment, she started talking about her pain.  She stated her pain in both knees was a 13 out of 10.  She stated she is not able to walk right away when she gets up and it is very painful to do any walking.  She does use a 4 prong cane.  She stated she is going to have knee surgery on her left knee on January 05, 2015.  She also stated she did not feel like the meloxicam was working for her pain.  She reports that tramadol, tylenol #3 and Vicoden make her itch.  I stated that since she had to leave, I could research other options and give them to her at the next home visit.  She stated that was fine.     Objective:   Current Medications: Current Outpatient Prescriptions  Medication Sig Dispense Refill  . acetaminophen (TYLENOL) 500 MG tablet Take 1,000 mg by mouth every 6 (six) hours as needed.    Marland Kitchen. albuterol (PROVENTIL HFA;VENTOLIN HFA) 108 (90 BASE) MCG/ACT inhaler Inhale into the lungs every 6 (six) hours as needed for wheezing or shortness of breath.    . ASPIRIN LOW DOSE 81 MG EC tablet Take 81 mg by mouth daily.   11  . baclofen (LIORESAL) 10 MG tablet Take 10 mg by mouth 3 (three) times daily.     . budesonide-formoterol (SYMBICORT) 80-4.5 MCG/ACT inhaler Inhale 2 puffs into the lungs 2 (two) times daily.    Marland Kitchen. desoximetasone (TOPICORT) 0.05 % cream Apply 1 application topically 2 (two) times daily as needed.     . diclofenac (VOLTAREN) 75 MG EC tablet Take 75 mg by mouth 2 (two) times daily.     Marland Kitchen. ENSURE PLUS (ENSURE PLUS) LIQD Take 237 mLs by mouth.    . latanoprost (XALATAN) 0.005 % ophthalmic solution Place 1 drop into both eyes at bedtime.   12  . levocetirizine (XYZAL) 2.5 MG/5ML solution Take 5 mg by mouth at bedtime as needed for allergies.     . meloxicam (MOBIC) 7.5 MG tablet Take 7.5 mg by mouth 3 times daily with meals, bedtime and 2 AM.    . metFORMIN (GLUCOPHAGE) 500 MG tablet Take 500 mg by mouth 2 (two) times daily with a meal.    . Olopatadine HCl 0.2 % SOLN Apply 1 drop to eye.    Marland Kitchen. omeprazole (PRILOSEC) 40 MG capsule Take 40 mg by mouth daily.    Marland Kitchen. OVER THE COUNTER MEDICATION Take 1 tablet by mouth as needed (heartburn).    . pravastatin (PRAVACHOL) 40 MG tablet Take 80 mg by mouth daily.     . pregabalin (LYRICA) 50 MG capsule Take 50 mg by mouth 2 (two) times daily.    . traMADol (ULTRAM) 50 MG tablet Take 1-2 tablets (50-100 mg total) by mouth every 6 (six) hours as needed. (Patient not taking: Reported on 10/31/2014) 60 tablet 0  . valsartan (DIOVAN) 160 MG tablet Take 1  tablet (160 mg total) by mouth daily. 30 tablet 0  . vitamin E 100 UNIT capsule Take by mouth daily.    Marland Kitchen zolpidem (AMBIEN) 10 MG tablet Take 10 mg by mouth at bedtime as needed.     No current facility-administered medications for this visit.    Functional Status: In your present state of health, do you have any difficulty performing the following activities: 10/28/2014 09/30/2014  Hearing? N N  Vision? N N  Difficulty concentrating or making decisions? N N  Walking or climbing stairs? N Y  Dressing or bathing? N Y  Doing errands, shopping? N Y  Quarry manager and eating ? N N  Using the Toilet? N N  In the past six months, have you accidently leaked urine? N Y  Do you have problems with loss of bowel control? N N  Managing your Medications? N Y  Managing your Finances? N Y  Housekeeping or managing your Housekeeping? N Y    Fall/Depression Screening: PHQ 2/9 Scores 10/28/2014  09/30/2014  PHQ - 2 Score 0 0    Assessment: Pain: currently not at her desired goal of a pain level of 4 out of 10.  The current medication, meloxicam, is not working to control the pain.   Plan: 1.  I will follow up with [name] on Wednesday, November 12, 2014 at 4 pm to review her medications and discuss pain options with her.   2.  She will have a follow up with Dr. Cyndie Mull on November 18, 2014.    Steve Rattler, PharmD, [employer] [facility] Environmental consultant [phone]

## 2014-11-10 ENCOUNTER — Encounter (HOSPITAL_COMMUNITY): Payer: Self-pay | Admitting: *Deleted

## 2014-11-10 ENCOUNTER — Emergency Department (INDEPENDENT_AMBULATORY_CARE_PROVIDER_SITE_OTHER)
Admission: EM | Admit: 2014-11-10 | Discharge: 2014-11-10 | Disposition: A | Discharge status: Home / Self Care | Payer: Commercial Managed Care - HMO | Source: Home / Self Care

## 2014-11-10 DIAGNOSIS — M170 Bilateral primary osteoarthritis of knee: Secondary | ICD-10-CM | POA: Diagnosis not present

## 2014-11-10 DIAGNOSIS — I10 Essential (primary) hypertension: Secondary | ICD-10-CM | POA: Diagnosis not present

## 2014-11-10 DIAGNOSIS — F419 Anxiety disorder, unspecified: Secondary | ICD-10-CM | POA: Diagnosis not present

## 2014-11-10 LAB — POCT I-STAT, CHEM 8
BUN: 17 mg/dL (ref 6–20)
CALCIUM ION: 1.22 mmol/L (ref 1.13–1.30)
Chloride: 106 mmol/L (ref 101–111)
Creatinine, Ser: 0.9 mg/dL (ref 0.44–1.00)
Glucose, Bld: 85 mg/dL (ref 65–99)
HCT: 42 % (ref 36.0–46.0)
HEMOGLOBIN: 14.3 g/dL (ref 12.0–15.0)
Potassium: 4 mmol/L (ref 3.5–5.1)
Sodium: 138 mmol/L (ref 135–145)
TCO2: 21 mmol/L (ref 0–100)

## 2014-11-10 MED ORDER — CLONAZEPAM 0.5 MG PO TABS: 0.5000 mg | ORAL_TABLET | Freq: Two times a day (BID) | ORAL | Status: DC | PRN

## 2014-11-10 MED ORDER — NAPROXEN 500 MG PO TABS: 500.0000 mg | ORAL_TABLET | Freq: Two times a day (BID) | ORAL | Status: DC

## 2014-11-10 NOTE — ED Notes (Addendum)
Pt  Reports     She     Feels  Lightheaded    And  Anxious         And   She  Reports          She  Feels      Sweaty   As   Well            She  States  She  Is  Scheduled  For     Surgery   Soon       On  Her  Knees

## 2014-11-10 NOTE — ED Provider Notes (Signed)
CSN: [id number]     Arrival date & time 11/10/14  1746 History   None    Chief Complaint  Patient presents with  . Hypertension   (Consider location/radiation/quality/duration/timing/severity/associated sxs/prior Treatment) HPI  She is a 68 year old woman here for evaluation of high blood pressure. She states she checked her blood pressure at home this morning and it was elevated to 180/70. After seeing this reading, she developed intermittent sweats and intermittent sharp chest pains. She continued to check her blood pressure and it remained elevated in the 160-180 range. She denies any dizziness. At this time, she denies any chest pain. She also reports significant pain in her knees. She is having knee replacement surgery next month. Her PCP put her on meloxicam, which she states does not help. She took Aleve this morning, which did help her knee pain.  Past Medical History  Diagnosis Date  . Insomnia   . Glaucoma   . Pulmonary nodule   . Asthma   . HLD (hyperlipidemia)   . Hypertension   . Diabetes mellitus without complication   . Schizoaffective disorder   . Osteoarthritis     knees   Past Surgical History  Procedure Laterality Date  . Gallbladder surgery     Family History  Problem Relation Age of Onset  . Colon cancer Neg Hx    History  Substance Use Topics  . Smoking status: Former Smoker    Quit date: 03/17/1989  . Smokeless tobacco: Never Used  . Alcohol Use: No   OB History    Gravida Para Term Preterm AB TAB SAB Ectopic Multiple Living   Review of Systems As in history of present illness Allergies  Bactrim; Ivp dye; Iodine; Sulfa antibiotics; Codeine; and Vicodin  Home Medications   Prior to Admission medications   Medication Sig Start Date End Date Taking? Authorizing Provider  acetaminophen (TYLENOL) 500 MG tablet Take 1,000 mg by mouth every 6 (six) hours as needed.    Historical Provider, MD  albuterol (PROVENTIL HFA;VENTOLIN  HFA) 108 (90 BASE) MCG/ACT inhaler Inhale into the lungs every 6 (six) hours as needed for wheezing or shortness of breath.    Historical Provider, MD  ASPIRIN LOW DOSE 81 MG EC tablet Take 81 mg by mouth daily.  07/18/14   Historical Provider, MD  baclofen (LIORESAL) 10 MG tablet Take 10 mg by mouth 3 (three) times daily.     Historical Provider, MD  budesonide-formoterol (SYMBICORT) 80-4.5 MCG/ACT inhaler Inhale 2 puffs into the lungs 2 (two) times daily.    Historical Provider, MD  clonazePAM (KLONOPIN) 0.5 MG tablet Take 1 tablet (0.5 mg total) by mouth 2 (two) times daily as needed for anxiety. 11/10/14   Charm Rings, MD  desoximetasone (TOPICORT) 0.05 % cream Apply 1 application topically 2 (two) times daily as needed.     Historical Provider, MD  ENSURE PLUS (ENSURE PLUS) LIQD Take 237 mLs by mouth.    Historical Provider, MD  latanoprost (XALATAN) 0.005 % ophthalmic solution Place 1 drop into both eyes at bedtime.  07/31/14   Historical Provider, MD  levocetirizine (XYZAL) 2.5 MG/5ML solution Take 5 mg by mouth at bedtime as needed for allergies.     Historical Provider, MD  metFORMIN (GLUCOPHAGE) 500 MG tablet Take 500 mg by mouth 2 (two) times daily with a meal.    Historical Provider, MD  naproxen (NAPROSYN) 500 MG tablet Take  1 tablet (500 mg total) by mouth 2 (two) times daily. 11/10/14   Charm RingsErin J Honig, MD  Olopatadine HCl 0.2 % SOLN Apply 1 drop to eye.    Historical Provider, MD  omeprazole (PRILOSEC) 40 MG capsule Take 40 mg by mouth daily.    Historical Provider, MD  OVER THE COUNTER MEDICATION Take 1 tablet by mouth as needed (heartburn).    Historical Provider, MD  pravastatin (PRAVACHOL) 40 MG tablet Take 80 mg by mouth daily.     Historical Provider, MD  pregabalin (LYRICA) 50 MG capsule Take 50 mg by mouth 2 (two) times daily.    Historical Provider, MD  valsartan (DIOVAN) 160 MG tablet Take 1 tablet (160 mg total) by mouth daily. 04/17/14   Ozella Rocksavid J Merrell, MD  vitamin E 100 UNIT  capsule Take by mouth daily.    Historical Provider, MD  zolpidem (AMBIEN) 10 MG tablet Take 10 mg by mouth at bedtime as needed.    Historical Provider, MD   BP 174/77 mmHg  Pulse 68  Temp(Src) 97.6 F (36.4 C) (Oral)  Resp 20  SpO2 100% Physical Exam  Constitutional: She is oriented to person, place, and time. She appears well-developed and well-nourished. No distress.  Neck: Neck supple.  Cardiovascular: Normal rate, regular rhythm and normal heart sounds.   No murmur heard. Pulmonary/Chest: Effort normal and breath sounds normal. No respiratory distress. She has no wheezes. She has no rales.  Neurological: She is alert and oriented to person, place, and time.  Skin:  She did have 2 bouts of sweatiness, that resolved spontaneously.    ED Course  Procedures (including critical care time) ED ECG REPORT   Date: 11/10/2014  Rate: 51  Rhythm: sinus bradycardia  QRS Axis: normal  Intervals: normal  ST/T Wave abnormalities: normal  Conduction Disutrbances:none  Narrative Interpretation: sinus bradycardia, otherwise normal ekg  Old EKG Reviewed: none available  I have personally reviewed the EKG tracing and agree with the computerized printout as noted.  Labs Review Labs Reviewed  POCT I-STAT, CHEM 8    Imaging Review No results found.   MDM   1. Anxiety   2. Essential hypertension   3. Primary osteoarthritis of both knees    I suspect that seeing an elevated blood pressure created anxiety. Klonopin 0.5 mg twice a day when necessary, #10 tablets given. Her blood pressure did improve in the office. I have changed her meloxicam to Naprosyn for her knees. Follow-up with PCP in 1-2 weeks.    Charm RingsErin J Honig, MD 11/10/14 2101

## 2014-11-10 NOTE — Discharge Instructions (Signed)
Your blood work and EKG were normal today. Your sweatiness and chest pains are likely related to anxiety from seeing your blood pressure go up. Your blood pressure was coming down in our office. Take Klonopin twice a day as needed for anxiety. Stop the meloxicam. Start Naprosyn 1 pill twice a day. Please follow-up with your PCP in 1-2 weeks to recheck your blood pressure.

## 2014-11-12 ENCOUNTER — Other Ambulatory Visit: Payer: Self-pay

## 2014-11-12 NOTE — Patient Outreach (Signed)
[facility]) Care Management  [facility]   11/12/2014  Brooke SalvoSandra H Davidson 1945-10-19 [id number]  Subjective: I went to Brooke Davidson's house to complete a home visit to review her medications.  When I arrived, she partially opened the door and asked for me to come back at a later day.  She had taken a shower and would not be ready to discuss her medications with me.    Objective:   Current Medications: Current Outpatient Prescriptions  Medication Sig Dispense Refill  . acetaminophen (TYLENOL) 500 MG tablet Take 1,000 mg by mouth every 6 (six) hours as needed.    Marland Kitchen. albuterol (PROVENTIL HFA;VENTOLIN HFA) 108 (90 BASE) MCG/ACT inhaler Inhale into the lungs every 6 (six) hours as needed for wheezing or shortness of breath.    . ASPIRIN LOW DOSE 81 MG EC tablet Take 81 mg by mouth daily.   11  . baclofen (LIORESAL) 10 MG tablet Take 10 mg by mouth 3 (three) times daily.     . budesonide-formoterol (SYMBICORT) 80-4.5 MCG/ACT inhaler Inhale 2 puffs into the lungs 2 (two) times daily.    . clonazePAM (KLONOPIN) 0.5 MG tablet Take 1 tablet (0.5 mg total) by mouth 2 (two) times daily as needed for anxiety. 10 tablet 0  . desoximetasone (TOPICORT) 0.05 % cream Apply 1 application topically 2 (two) times daily as needed.     . ENSURE PLUS (ENSURE PLUS) LIQD Take 237 mLs by mouth.    . latanoprost (XALATAN) 0.005 % ophthalmic solution Place 1 drop into both eyes at bedtime.   12  . levocetirizine (XYZAL) 2.5 MG/5ML solution Take 5 mg by mouth at bedtime as needed for allergies.     . metFORMIN (GLUCOPHAGE) 500 MG tablet Take 500 mg by mouth 2 (two) times daily with a meal.    . naproxen (NAPROSYN) 500 MG tablet Take 1 tablet (500 mg total) by mouth 2 (two) times daily. 60 tablet 0  . Olopatadine HCl 0.2 % SOLN Apply 1 drop to eye.    Marland Kitchen. omeprazole (PRILOSEC) 40 MG capsule Take 40 mg by mouth daily.    Marland Kitchen. OVER THE COUNTER MEDICATION Take 1 tablet by mouth as needed (heartburn).     . pravastatin (PRAVACHOL) 40 MG tablet Take 80 mg by mouth daily.     . pregabalin (LYRICA) 50 MG capsule Take 50 mg by mouth 2 (two) times daily.    . valsartan (DIOVAN) 160 MG tablet Take 1 tablet (160 mg total) by mouth daily. 30 tablet 0  . vitamin E 100 UNIT capsule Take by mouth daily.    Marland Kitchen. zolpidem (AMBIEN) 10 MG tablet Take 10 mg by mouth at bedtime as needed.     No current facility-administered medications for this visit.    Functional Status: In your present state of health, do you have any difficulty performing the following activities: 10/28/2014 09/30/2014  Hearing? N N  Vision? N N  Difficulty concentrating or making decisions? N N  Walking or climbing stairs? N Y  Dressing or bathing? N Y  Doing errands, shopping? N Y  Quarry managerreparing Food and eating ? N N  Using the Toilet? N N  In the past six months, have you accidently leaked urine? N Y  Do you have problems with loss of bowel control? N N  Managing your Medications? N Y  Managing your Finances? N Y  Housekeeping or managing your Housekeeping? N Y    Fall/Depression Screening: PHQ 2/9 Scores  10/28/2014 09/30/2014  PHQ - 2 Score 0 0    Assessment:  Plan: 1.  I will call Brooke Davidson tomorrow to set up a date to come out to review her medications.  This is the second time Brooke Davidson has asked me to leave when we had an appointment.   2.  I will call Brooke Davidson before my next visit.   Steve Rattler, PharmD, [employer] [facility] Environmental consultant [phone]

## 2014-11-27 NOTE — Telephone Encounter (Signed)
Called patient at [phone] (cell #) regarding when they should get another colonoscopy. I recommended patient to have an office visit with Dr. Juanda Chance to discuss getting a colonoscopy. Patient stated she did not want to schedule an appointment at this time, but would call back the office when she was ready to schedule an appointment for this procedure.

## 2014-12-01 ENCOUNTER — Other Ambulatory Visit: Payer: Self-pay | Admitting: *Deleted

## 2014-12-01 ENCOUNTER — Ambulatory Visit: Payer: Medicare HMO | Admitting: *Deleted

## 2014-12-01 NOTE — Patient Outreach (Signed)
F/u phone call as saw pt called RN CM's phone  earlier but left no message.   Spoke with pt, home visit rescheduled for 6/22.      Shayne Alken.   Pierzchala RN CCM [facility] Care Management  [phone]

## 2014-12-01 NOTE — Patient Outreach (Signed)
Arrived at pt's home today for scheduled home visit, knocked on her door several times, no response.  Called pt's cell  phone to which pt answered, states forgot had visit today.  [place] currently [place] ([place] will call RN CM once back home - to reschedule home visit.      Shayne Alken.   Pierzchala RN CCM [facility] Care Management  [phone]

## 2014-12-04 ENCOUNTER — Other Ambulatory Visit: Payer: Self-pay | Admitting: *Deleted

## 2014-12-05 NOTE — Patient Outreach (Signed)
[facility]) Care Management  [place]  12/05/2014   Brooke Davidson [birth year] [id number]  Subjective:  Pt states she has a lot going on, to have knee (left) surgery at the end of July, need to move (more affordable).  Pt  states to go for pre op 7/13, lab work 7/14. Pt states she f/u with Dr. Jeanie Cooks 6/6, provided paperwork.  Pt states MD wants her to go to pain management, but does not want to take pain medication.  Pt states she is taking two Aleve, helps.   Pt states she is to f/u again with MD after her knee surgery.    Pt states she visited [place] recently, left her glucometer in sister's car, was checking sugars three times a week.  RN CM discussed recent urgent  care visit- BP elevated.  Pt states she was checking BP with BP machine provided by RN CM, stopped  Because readings off.  Pt states BP machine is in sister's car.     Objective:  BP 170/82 right arm, 150/82 left.  HR- 56, respirations-16, O 2 sat- 95%.  Lungs clear.   Current medications: Reviewed medications with pt.  Acetaminophen 500 mg- take 1000 mg by mouth every 6 hours as needed. Desoximetasone (Topicort) 0.05% cream- two times a day as needed Albuterol inhaler (Proventil HFAP 108 (90 base) mcg/act inhaler- inhale into lungs every 6 hours as needed           For wheezing/sob.  Aspirin 81 mg- take one tablet daily Symbicort 80-4.61mcg/act inhaler = inhale 2 puffs into lungs two times daily Latanoprost (Xalatan) 0.005% opthalmic solution- place one drop in eyes at bedtime. Metformin (Glucophage) 500 mg- take 500 mg by mouth two times a day with a meal Levocetirizine ( Xyzal) 2.5mg /57ml solution- take 5 mg by mouth as needed for allergies  Naproxen (Naprosyn) 500 mg- take one tablet by mouth two times a day Olopotodine HCL 0.2 % solution- apply one drop to eye  Pravastatin (Pravachol) 40mg - take 80 mg daily Volsartan (Diovan) 160 mg- take one tablet daily Vitamin E 100 unit  Capsule- take  by mouth daily Zolpidem (Ambien) 10mg  - take 10 mg by mouth at bedtime as needed.   Assessment: Upon arrival, pt appeared stressed- states has a lot going on.                          DM- pt did not have glucometer/readings available.  States was checking 3 times a week                        Upcoming left knee surgery- scheduled for 7/25.                          HTN-  Today 150/82 left arm,170/82 right arm, pt needs to start back checking BP.                         Medication issues- review of pt's med list with list from recent MD visit- pt states not                                On Hyzaar, pharmacy called- no rx called in.   Plan:   Pt to continue to check blood sugars three times a  week/record             Pt to start back checking BP, if readings seem off, call RN CM to replace BP machine.  Record/call MD if                    Elevated.             Pt to have left knee surgery 7/25.              Per pt's request RN CM to call pt on 7/21 to check on status, hold off on home visit this month with                   Upcoming surgery 7/25.              RN CM to also f/u with pt after surgery.   [facility] CM Care Plan Problem One        Patient Outreach from 10/31/2014 in [employer] Most recent reading at 10/31/2014  3:26 PM   THN CM Short Term Goal #1 (0-30 days)  Pt would be able to verbalize the good carbs to choose from in the next 30 days    THN CM Short Term Goal #1 Start Date  09/30/14   [facility] CM Short Term Goal #1 Met Date  10/31/14    [facility] CM Care Plan Problem Two        Patient Outreach from 12/04/2014 in [place] Problem Two  Elevated BP    Care Plan for Problem Two  Active   THN CM Short Term Goal #1 (0-30 days)  pt would start back checking BP, record, inform MD if elevated.    THN CM Short Term Goal #1 Start Date  12/04/14   Interventions for Short Term Goal #2   Discussed with pt importance of checking BP- recent urgent care visit - BP  elevated, upcoming knee surgery    THN CM Short Term Goal #2 Met Date  12/04/14 [pt reports been checking 3 times a week. ]    [facility] CM Care Plan Problem Three        Patient Outreach Telephone from 10/31/2014 in [place] Problem Three  Lack of transportation to physician appointments.   Care Plan for Problem Three  Active   THN CM Short Term Goal #1 (0-30 days)  CSW will assist patient with completion of a SCAT Paramedic) to assist patient with transport to all physician appointments, within the next two weeks.   THN CM Short Term Goal #1 Start Date  10/03/14   [facility] CM Short Term Goal #1 Met Date  10/10/14   Interventions for Short Term Goal #1  CSW will schedule a home visit with patient to assist with completion of a SCAT Paramedic).   THN CM Short Term Goal #2 (0-30 days)  Once SCAT Paramedic) application is completed, CSW will submit to the Department of Transportation for processing, within the next 30 days.   THN CM Short Term Goal #2 Start Date  10/03/14   [facility] CM Short Term Goal #2 Met Date  10/10/14   Interventions for Short Term Goal #2  Patient is aware that a representative from the Department of Transportation will call her directly to discuss eligibility of services through [employer], once the application is submitted for  processing.      Zara Chess.   [place] Care Management  [phone]

## 2014-12-25 ENCOUNTER — Encounter (HOSPITAL_COMMUNITY): Payer: Self-pay | Admitting: Physician Assistant

## 2014-12-25 ENCOUNTER — Inpatient Hospital Stay (HOSPITAL_COMMUNITY)
Admission: RE | Admit: 2014-12-25 | Discharge: 2014-12-25 | Disposition: A | Discharge status: Home / Self Care | Payer: Commercial Managed Care - HMO | Source: Ambulatory Visit

## 2014-12-25 DIAGNOSIS — I10 Essential (primary) hypertension: Secondary | ICD-10-CM | POA: Diagnosis present

## 2014-12-25 DIAGNOSIS — M1712 Unilateral primary osteoarthritis, left knee: Secondary | ICD-10-CM | POA: Diagnosis present

## 2014-12-25 DIAGNOSIS — E785 Hyperlipidemia, unspecified: Secondary | ICD-10-CM | POA: Diagnosis present

## 2014-12-25 DIAGNOSIS — F259 Schizoaffective disorder, unspecified: Secondary | ICD-10-CM | POA: Diagnosis present

## 2014-12-25 DIAGNOSIS — H409 Unspecified glaucoma: Secondary | ICD-10-CM | POA: Diagnosis present

## 2014-12-25 DIAGNOSIS — J45909 Unspecified asthma, uncomplicated: Secondary | ICD-10-CM | POA: Diagnosis present

## 2014-12-25 DIAGNOSIS — G4700 Insomnia, unspecified: Secondary | ICD-10-CM | POA: Diagnosis present

## 2014-12-25 DIAGNOSIS — R911 Solitary pulmonary nodule: Secondary | ICD-10-CM | POA: Diagnosis present

## 2014-12-25 DIAGNOSIS — E119 Type 2 diabetes mellitus without complications: Secondary | ICD-10-CM

## 2014-12-25 DIAGNOSIS — M1990 Unspecified osteoarthritis, unspecified site: Secondary | ICD-10-CM | POA: Diagnosis present

## 2014-12-25 NOTE — H&P (Addendum)
TOTAL KNEE ADMISSION H&P  Patient is being admitted for left total knee arthroplasty.  Subjective:  Chief Complaint:left knee pain.  HPI: Brooke Davidson, 69 y.o. female, has a history of pain and functional disability in the left knee due to arthritis and has failed non-surgical conservative treatments for greater than 12 weeks to includeNSAID's and/or analgesics, corticosteriod injections, viscosupplementation injections, flexibility and strengthening excercises, supervised PT with diminished ADL's post treatment, use of assistive devices, weight reduction as appropriate and activity modification.  Onset of symptoms was gradual, starting 10 years ago with gradually worsening course since that time. The patient noted no past surgery on the left knee(s).  Patient currently rates pain in the left knee(s) at 10 out of 10 with activity. Patient has night pain, worsening of pain with activity and weight bearing, pain that interferes with activities of daily living, crepitus and joint swelling.  Patient has evidence of subchondral sclerosis, periarticular osteophytes and joint space narrowing by imaging studies. There is no active infection.  Patient Active Problem List   Diagnosis Date Noted  . Primary localized osteoarthritis of left knee   . Osteoarthritis   . Schizoaffective disorder   . Diabetes mellitus without complication   . Hypertension   . HLD (hyperlipidemia)   . Asthma   . Pulmonary nodule   . Glaucoma   . Insomnia   . Well woman exam with routine gynecological exam 08/04/2014   Past Medical History  Diagnosis Date  . Insomnia   . Glaucoma   . Pulmonary nodule   . Asthma   . HLD (hyperlipidemia)   . Hypertension   . Diabetes mellitus without complication   . Schizoaffective disorder   . Osteoarthritis     knees  . Primary localized osteoarthritis of left knee     Past Surgical History  Procedure Laterality Date  . Gallbladder surgery  1997    No current  facility-administered medications for this encounter.  Current outpatient prescriptions:  .  ASPIRIN LOW DOSE 81 MG EC tablet, Take 81 mg by mouth daily. , Disp: , Rfl: 11 .  budesonide-formoterol (SYMBICORT) 80-4.5 MCG/ACT inhaler, Inhale 2 puffs into the lungs 2 (two) times daily., Disp: , Rfl:  .  desoximetasone (TOPICORT) 0.05 % cream, Apply 1 application topically 2 (two) times daily as needed (nose). , Disp: , Rfl:  .  diphenhydramine-acetaminophen (TYLENOL PM) 25-500 MG TABS, Take 1 tablet by mouth at bedtime., Disp: , Rfl:  .  latanoprost (XALATAN) 0.005 % ophthalmic solution, Place 1 drop into both eyes at bedtime. , Disp: , Rfl: 12 .  metFORMIN (GLUCOPHAGE) 500 MG tablet, Take 500 mg by mouth 2 (two) times daily with a meal., Disp: , Rfl:  .  naproxen sodium (ANAPROX) 220 MG tablet, Take 220 mg by mouth daily. , Disp: , Rfl:  .  Omega-3 Fatty Acids (FISH OIL PO), Take 1 tablet by mouth 2 (two) times daily., Disp: , Rfl:  .  omeprazole (PRILOSEC) 40 MG capsule, Take 40 mg by mouth daily as needed (heartburn). , Disp: , Rfl:  .  pravastatin (PRAVACHOL) 80 MG tablet, Take 80 mg by mouth at bedtime., Disp: , Rfl:  .  valsartan (DIOVAN) 160 MG tablet, Take 1 tablet (160 mg total) by mouth daily., Disp: 30 tablet, Rfl: 0 .  vitamin E 100 UNIT capsule, Take 100 Units by mouth daily. , Disp: , Rfl:  .  zolpidem (AMBIEN) 10 MG tablet, Take 10 mg by mouth at bedtime. , Disp: ,  Rfl:  .  albuterol (PROVENTIL HFA;VENTOLIN HFA) 108 (90 BASE) MCG/ACT inhaler, Inhale into the lungs every 6 (six) hours as needed for wheezing or shortness of breath., Disp: , Rfl:  .  baclofen (LIORESAL) 20 MG tablet, Take 20 mg by mouth daily as needed for muscle spasms. , Disp: , Rfl: 5 .  clonazePAM (KLONOPIN) 0.5 MG tablet, Take 1 tablet (0.5 mg total) by mouth 2 (two) times daily as needed for anxiety. (Patient not taking: Reported on 12/24/2014), Disp: 10 tablet, Rfl: 0 .  naproxen (NAPROSYN) 500 MG tablet, Take 1  tablet (500 mg total) by mouth 2 (two) times daily. (Patient not taking: Reported on 12/24/2014), Disp: 60 tablet, Rfl: 0    Allergies  Allergen Reactions  . Bactrim [Sulfamethoxazole-Trimethoprim] Swelling    Tongue swells  . Ivp Dye [Iodinated Diagnostic Agents] Hives  . Clonazepam     dizziness  . Iodine   . Sulfa Antibiotics Swelling  . Codeine Itching  . Vicodin [Hydrocodone-Acetaminophen] Itching    History  Substance Use Topics  . Smoking status: Former Smoker    Quit date: 03/17/1989  . Smokeless tobacco: Never Used  . Alcohol Use: No    Family History  Problem Relation Age of Onset  . Colon cancer Neg Hx   . Heart attack Father   . Glaucoma Mother   . Diabetes Mellitus II Mother   . Hypertension Sister   . Cancer Maternal Grandmother      Review of Systems  Constitutional: Negative.   HENT: Negative.   Eyes: Negative.   Respiratory: Negative.   Cardiovascular: Negative.   Gastrointestinal: Negative.   Genitourinary: Negative.   Musculoskeletal: Positive for back pain and joint pain.  Skin: Negative.   Neurological: Negative.   Endo/Heme/Allergies: Negative.   Psychiatric/Behavioral: Negative.     Objective:  Physical Exam  Constitutional: She is oriented to person, place, and time. She appears well-developed and well-nourished.  HENT:  Head: Normocephalic and atraumatic.  Mouth/Throat: Oropharynx is clear and moist.  Eyes: Conjunctivae and EOM are normal. Pupils are equal, round, and reactive to light.  Neck: Neck supple.  Cardiovascular: Normal rate and regular rhythm.   Murmur heard. Respiratory: Effort normal and breath sounds normal.  GI: Soft. Bowel sounds are normal.  Genitourinary:  Not pertinent to current symptomatology therefore not examined.  Musculoskeletal:  Examination of both knees reveals moderate varus deformities and diffuse pain.  She has 1+ effusion.  Range-of-motion is -5 to 120 degrees. Her knees are stable with normal  patellar tracking.  2+ DP pulses  Neurological: She is alert and oriented to person, place, and time.  Skin: Skin is warm and dry.  Psychiatric: She has a normal mood and affect.    Vital signs in last 24 hours: Temp:  [98.2 F (36.8 C)] 98.2 F (36.8 C) (07/14 1600) Pulse Rate:  [69] 69 (07/14 1600) BP: (163)/(81) 163/81 mmHg (07/14 1600) SpO2:  [99 %] 99 % (07/14 1600) Weight:  [70.761 kg (156 lb)] 70.761 kg (156 lb) (07/14 1600)  Labs:   Estimated body mass index is 27.64 kg/(m^2) as calculated from the following:   Height as of this encounter:  (1.6 m).   Weight as of this encounter: 70.761 kg (156 lb).   Imaging Review Plain radiographs demonstrate severe degenerative joint disease of the left knee(s). The overall alignment issignificant varus. The bone quality appears to be good for age and reported activity level.  Assessment/Plan:  End stage arthritis, left  knee Active Problems:   Primary localized osteoarthritis of left knee   Osteoarthritis   Schizoaffective disorder   Diabetes mellitus without complication   Hypertension   HLD (hyperlipidemia)   Asthma   Pulmonary nodule   Glaucoma   Insomnia    The patient history, physical examination, clinical judgment of the provider and imaging studies are consistent with end stage degenerative joint disease of the left knee(s) and total knee arthroplasty is deemed medically necessary. The treatment options including medical management, injection therapy arthroscopy and arthroplasty were discussed at length. The risks and benefits of total knee arthroplasty were presented and reviewed. The risks due to aseptic loosening, infection, stiffness, patella tracking problems, thromboembolic complications and other imponderables were discussed. The patient acknowledged the explanation, agreed to proceed with the plan and consent was signed. Patient is being admitted for inpatient treatment for surgery, pain control, PT, OT,  prophylactic antibiotics, VTE prophylaxis, progressive ambulation and ADL's and discharge planning. 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  She has started using her mupirocin  Ointment intranasally.  Will plan on antibiotics in the cement

## 2014-12-25 NOTE — Pre-Procedure Instructions (Signed)
    Brooke Davidson  12/25/2014      [facility] [id number] [place] - [address] [facility] [address] [facility] [place] [place] [address] Phone: [phone] Fax: [phone]    Your procedure is scheduled on 01-05-2015  Monday   [facility]30 A.M .  Call this number if you have problems the morning of surgery:  [phone]     Remember:  Do not eat food or drink liquids after midnight.   Take these medicines the morning of surgery with A SIP OF WATER albuterol inhaler if needed,baclofen if needed, symbicort in haler,omeperzole(prilosec),              Stop aspirin,naproxen(Annaprox),fish oil 5 days prior to surgery 12-31-14               NO DIABETIC MEDICATIONS THE MORNING OF SURGERY   Do not wear jewelry, make-up or nail polish.  Do not wear lotions, powders, or perfumes.    Do not shave 48 hours prior to surgery.    Do not bring valuables to the hospital.   [facility] is not responsible for any belongings or valuables.  Contacts, dentures or bridgework may not be worn into surgery.  Leave your suitcase in the car.  After surgery it may be brought to your room.  For patients admitted to the hospital, discharge time will be determined by your treatment team.   Special instructions: See attached sheet "Preparing for Surgery"for instructions of CHG shower   Please read over the following fact sheets that you were given. Pain Booklet, Coughing and Deep Breathing, Blood Transfusion Information and Surgical Site Infection Prevention

## 2014-12-30 ENCOUNTER — Encounter (HOSPITAL_COMMUNITY)
Admission: RE | Admit: 2014-12-30 | Discharge: 2014-12-30 | Disposition: A | Discharge status: Home / Self Care | Payer: Commercial Managed Care - HMO | Source: Ambulatory Visit | Attending: Orthopedic Surgery | Admitting: Orthopedic Surgery

## 2014-12-30 ENCOUNTER — Encounter (HOSPITAL_COMMUNITY): Payer: Self-pay

## 2014-12-30 DIAGNOSIS — Z0183 Encounter for blood typing: Secondary | ICD-10-CM | POA: Diagnosis not present

## 2014-12-30 DIAGNOSIS — Z01812 Encounter for preprocedural laboratory examination: Secondary | ICD-10-CM | POA: Insufficient documentation

## 2014-12-30 HISTORY — DX: Major depressive disorder, single episode, unspecified: F32.9

## 2014-12-30 HISTORY — DX: Pain in unspecified joint: M25.50

## 2014-12-30 HISTORY — DX: Cough, unspecified: R05.9

## 2014-12-30 HISTORY — DX: Other muscle spasm: M62.838

## 2014-12-30 HISTORY — DX: Other complications of anesthesia, initial encounter: T88.59XA

## 2014-12-30 HISTORY — DX: Urgency of urination: R39.15

## 2014-12-30 HISTORY — DX: Dorsalgia, unspecified: M54.9

## 2014-12-30 HISTORY — DX: Dermatitis, unspecified: L30.9

## 2014-12-30 HISTORY — DX: Gastro-esophageal reflux disease without esophagitis: K21.9

## 2014-12-30 HISTORY — DX: Personal history of other diseases of the respiratory system: Z87.09

## 2014-12-30 HISTORY — DX: Cough: R05

## 2014-12-30 HISTORY — DX: Frequency of micturition: R35.0

## 2014-12-30 HISTORY — DX: Adverse effect of unspecified anesthetic, initial encounter: T41.45XA

## 2014-12-30 HISTORY — DX: Effusion, unspecified joint: M25.40

## 2014-12-30 HISTORY — DX: Depression, unspecified: F32.A

## 2014-12-30 HISTORY — DX: Other chronic pain: G89.29

## 2014-12-30 LAB — ABO/RH: ABO/RH(D): A NEG

## 2014-12-30 LAB — TYPE AND SCREEN
ABO/RH(D): A NEG
Antibody Screen: NEGATIVE

## 2014-12-30 LAB — CBC WITH DIFFERENTIAL/PLATELET
BASOS ABS: 0 10*3/uL (ref 0.0–0.1)
Basophils Relative: 0 % (ref 0–1)
Eosinophils Absolute: 0.2 10*3/uL (ref 0.0–0.7)
Eosinophils Relative: 4 % (ref 0–5)
HCT: 38.6 % (ref 36.0–46.0)
Hemoglobin: 13.1 g/dL (ref 12.0–15.0)
Lymphocytes Relative: 40 % (ref 12–46)
Lymphs Abs: 2.1 10*3/uL (ref 0.7–4.0)
MCH: 28.5 pg (ref 26.0–34.0)
MCHC: 33.9 g/dL (ref 30.0–36.0)
MCV: 84.1 fL (ref 78.0–100.0)
Monocytes Absolute: 0.4 10*3/uL (ref 0.1–1.0)
Monocytes Relative: 8 % (ref 3–12)
Neutro Abs: 2.5 10*3/uL (ref 1.7–7.7)
Neutrophils Relative %: 48 % (ref 43–77)
Platelets: 327 10*3/uL (ref 150–400)
RBC: 4.59 MIL/uL (ref 3.87–5.11)
RDW: 13.6 % (ref 11.5–15.5)
WBC: 5.3 10*3/uL (ref 4.0–10.5)

## 2014-12-30 LAB — PROTIME-INR
INR: 1.03 (ref 0.00–1.49)
Prothrombin Time: 13.7 seconds (ref 11.6–15.2)

## 2014-12-30 LAB — COMPREHENSIVE METABOLIC PANEL
ALBUMIN: 4 g/dL (ref 3.5–5.0)
ALT: 13 U/L — ABNORMAL LOW (ref 14–54)
ANION GAP: 8 (ref 5–15)
AST: 18 U/L (ref 15–41)
Alkaline Phosphatase: 81 U/L (ref 38–126)
BUN: 11 mg/dL (ref 6–20)
CHLORIDE: 104 mmol/L (ref 101–111)
CO2: 24 mmol/L (ref 22–32)
CREATININE: 0.93 mg/dL (ref 0.44–1.00)
Calcium: 9 mg/dL (ref 8.9–10.3)
Glucose, Bld: 120 mg/dL — ABNORMAL HIGH (ref 65–99)
Potassium: 3.9 mmol/L (ref 3.5–5.1)
SODIUM: 136 mmol/L (ref 135–145)
Total Bilirubin: 0.7 mg/dL (ref 0.3–1.2)
Total Protein: 7.1 g/dL (ref 6.5–8.1)

## 2014-12-30 LAB — APTT: APTT: 29 s (ref 24–37)

## 2014-12-30 LAB — SURGICAL PCR SCREEN
MRSA, PCR: NEGATIVE
STAPHYLOCOCCUS AUREUS: POSITIVE — AB

## 2014-12-30 LAB — GLUCOSE, CAPILLARY: GLUCOSE-CAPILLARY: 82 mg/dL (ref 65–99)

## 2014-12-30 MED ORDER — CHLORHEXIDINE GLUCONATE 4 % EX LIQD: 60.0000 mL | Freq: Once | CUTANEOUS | Status: DC

## 2014-12-30 NOTE — Progress Notes (Signed)
[facility] [address]

## 2014-12-30 NOTE — Pre-Procedure Instructions (Signed)
Brooke SalvoSandra H Onorato  12/30/2014      [facility] [phone] [place], [place] - [address] AT [facility] [address] [facility] [address] [place] [address] Phone: [phone] Fax: [phone]    Your procedure is scheduled on Mon, July 25 @ 11:00 AM  [facility] 9[facility]00 AM  Call this number if you have problems the morning of surgery:  [phone]   Remember:  Do not eat food or drink liquids after midnight.  Take these medicines the morning of surgery with A SIP OF WATER Albuterol<Bring Your Inhaler With You>,Baclofen(Lioresal),Symbicort,and Omeprazole(Prilosec)               Stop taking your Vit E,Aleve,Fish Oil,Vitamins,and any Herbal Medications. No Goody's,BC's,Aspirin,or Ibuprofen.     Do not wear jewelry, make-up or nail polish.  Do not wear lotions, powders, or perfumes.  You may wear deodorant.  Do not shave 48 hours prior to surgery.    Do not bring valuables to the hospital.  [facility] is not responsible for any belongings or valuables.  Contacts, dentures or bridgework may not be worn into surgery.  Leave your suitcase in the car.  After surgery it may be brought to your room.  For patients admitted to the hospital, discharge time will be determined by your treatment team.  Patients discharged the day of surgery will not be allowed to drive home.    Special instructions:  [place] - Preparing for Surgery  Before surgery, you can play an important role.  Because skin is not sterile, your skin needs to be as free of germs as possible.  You can reduce the number of germs on you skin by washing with CHG (chlorahexidine gluconate) soap before surgery.  CHG is an antiseptic cleaner which kills germs and bonds with the skin to continue killing germs even after washing.  Please DO NOT use if you have an allergy to CHG or antibacterial soaps.  If your skin becomes reddened/irritated stop using the CHG and inform your  nurse when you arrive at Short Stay.  Do not shave (including legs and underarms) for at least 48 hours prior to the first CHG shower.  You may shave your face.  Please follow these instructions carefully:   1.  Shower with CHG Soap the night before surgery and the                                morning of Surgery.  2.  If you choose to wash your hair, wash your hair first as usual with your       normal shampoo.  3.  After you shampoo, rinse your hair and body thoroughly to remove the                      Shampoo.  4.  Use CHG as you would any other liquid soap.  You can apply chg directly       to the skin and wash gently with scrungie or a clean washcloth.  5.  Apply the CHG Soap to your body ONLY FROM THE NECK DOWN.        Do not use on open wounds or open sores.  Avoid contact with your eyes,       ears, mouth and genitals (private parts).  Wash genitals (private parts)  with your normal soap.  6.  Wash thoroughly, paying special attention to the area where your surgery        will be performed.  7.  Thoroughly rinse your body with warm water from the neck down.  8.  DO NOT shower/wash with your normal soap after using and rinsing off       the CHG Soap.  9.  Pat yourself dry with a clean towel.            10.  Wear clean pajamas.            11.  Place clean sheets on your bed the night of your first shower and do not        sleep with pets.  Day of Surgery  Do not apply any lotions/deoderants the morning of surgery.  Please wear clean clothes to the hospital/surgery center.    Please read over the following fact sheets that you were given. Pain Booklet, Coughing and Deep Breathing, Blood Transfusion Information, MRSA Information and Surgical Site Infection Prevention

## 2014-12-30 NOTE — Progress Notes (Addendum)
Cardiologist denies having one  Medical Md is Dr.Edwin Avbuere  EKG in epic from 11-10-14  Echo had one done 15+yrs ago  Stress test had one done 15+ yrs ago  Heart cath denies ever having one  CXR denies having one in past yr

## 2014-12-30 NOTE — Progress Notes (Signed)
Watched emmi video

## 2014-12-31 LAB — URINE CULTURE: Culture: NO GROWTH

## 2015-01-05 ENCOUNTER — Encounter (HOSPITAL_COMMUNITY)
Admission: RE | Disposition: A | Discharge status: Skilled Nursing Facility | Payer: Self-pay | Source: Ambulatory Visit | Attending: Orthopedic Surgery

## 2015-01-05 ENCOUNTER — Inpatient Hospital Stay (HOSPITAL_COMMUNITY): Payer: Commercial Managed Care - HMO

## 2015-01-05 ENCOUNTER — Encounter (HOSPITAL_COMMUNITY): Payer: Self-pay | Admitting: General Practice

## 2015-01-05 ENCOUNTER — Inpatient Hospital Stay (HOSPITAL_COMMUNITY)
Admission: RE | Admit: 2015-01-05 | Discharge: 2015-01-07 | DRG: 470 | Disposition: A | Discharge status: Skilled Nursing Facility | Payer: Commercial Managed Care - HMO | Source: Ambulatory Visit | Attending: Orthopedic Surgery | Admitting: Orthopedic Surgery

## 2015-01-05 ENCOUNTER — Inpatient Hospital Stay (HOSPITAL_COMMUNITY): Payer: Commercial Managed Care - HMO | Admitting: Anesthesiology

## 2015-01-05 DIAGNOSIS — F259 Schizoaffective disorder, unspecified: Secondary | ICD-10-CM | POA: Diagnosis not present

## 2015-01-05 DIAGNOSIS — K219 Gastro-esophageal reflux disease without esophagitis: Secondary | ICD-10-CM | POA: Diagnosis not present

## 2015-01-05 DIAGNOSIS — E785 Hyperlipidemia, unspecified: Secondary | ICD-10-CM | POA: Diagnosis present

## 2015-01-05 DIAGNOSIS — I10 Essential (primary) hypertension: Secondary | ICD-10-CM | POA: Diagnosis present

## 2015-01-05 DIAGNOSIS — H409 Unspecified glaucoma: Secondary | ICD-10-CM | POA: Diagnosis not present

## 2015-01-05 DIAGNOSIS — Z833 Family history of diabetes mellitus: Secondary | ICD-10-CM | POA: Diagnosis not present

## 2015-01-05 DIAGNOSIS — Z22321 Carrier or suspected carrier of Methicillin susceptible Staphylococcus aureus: Secondary | ICD-10-CM

## 2015-01-05 DIAGNOSIS — M1990 Unspecified osteoarthritis, unspecified site: Secondary | ICD-10-CM | POA: Diagnosis present

## 2015-01-05 DIAGNOSIS — G4700 Insomnia, unspecified: Secondary | ICD-10-CM | POA: Diagnosis present

## 2015-01-05 DIAGNOSIS — M1712 Unilateral primary osteoarthritis, left knee: Secondary | ICD-10-CM | POA: Diagnosis not present

## 2015-01-05 DIAGNOSIS — J45909 Unspecified asthma, uncomplicated: Secondary | ICD-10-CM | POA: Diagnosis not present

## 2015-01-05 DIAGNOSIS — Z8249 Family history of ischemic heart disease and other diseases of the circulatory system: Secondary | ICD-10-CM

## 2015-01-05 DIAGNOSIS — M1710 Unilateral primary osteoarthritis, unspecified knee: Secondary | ICD-10-CM | POA: Diagnosis present

## 2015-01-05 DIAGNOSIS — Z87891 Personal history of nicotine dependence: Secondary | ICD-10-CM | POA: Diagnosis not present

## 2015-01-05 DIAGNOSIS — R911 Solitary pulmonary nodule: Secondary | ICD-10-CM | POA: Diagnosis present

## 2015-01-05 DIAGNOSIS — E119 Type 2 diabetes mellitus without complications: Secondary | ICD-10-CM

## 2015-01-05 DIAGNOSIS — Z419 Encounter for procedure for purposes other than remedying health state, unspecified: Secondary | ICD-10-CM

## 2015-01-05 DIAGNOSIS — M179 Osteoarthritis of knee, unspecified: Secondary | ICD-10-CM | POA: Diagnosis present

## 2015-01-05 DIAGNOSIS — M25562 Pain in left knee: Secondary | ICD-10-CM | POA: Diagnosis present

## 2015-01-05 HISTORY — PX: TOTAL KNEE ARTHROPLASTY: SHX125

## 2015-01-05 HISTORY — DX: Unilateral primary osteoarthritis, left knee: M17.12

## 2015-01-05 LAB — GLUCOSE, CAPILLARY
GLUCOSE-CAPILLARY: 212 mg/dL — AB (ref 65–99)
Glucose-Capillary: 101 mg/dL — ABNORMAL HIGH (ref 65–99)
Glucose-Capillary: 122 mg/dL — ABNORMAL HIGH (ref 65–99)
Glucose-Capillary: 105 mg/dL — ABNORMAL HIGH (ref 65–99)

## 2015-01-05 SURGERY — ARTHROPLASTY, KNEE, TOTAL
Anesthesia: General | Site: Knee | Laterality: Left

## 2015-01-05 MED ORDER — METOCLOPRAMIDE HCL 5 MG/ML IJ SOLN: 5.0000 mg | Freq: Three times a day (TID) | INTRAMUSCULAR | Status: DC | PRN

## 2015-01-05 MED ORDER — POTASSIUM CHLORIDE IN NACL 20-0.9 MEQ/L-% IV SOLN
INTRAVENOUS | Status: DC
  Administered 2015-01-05 (×2): via INTRAVENOUS
  Filled 2015-01-05 (×2): qty 1000

## 2015-01-05 MED ORDER — MIDAZOLAM HCL 2 MG/2ML IJ SOLN
INTRAMUSCULAR | Status: AC
  Administered 2015-01-05: 2 mg
  Filled 2015-01-05: qty 2

## 2015-01-05 MED ORDER — HYDROMORPHONE HCL 2 MG PO TABS
ORAL_TABLET | ORAL | Status: AC
  Filled 2015-01-05: qty 1

## 2015-01-05 MED ORDER — DEXAMETHASONE SODIUM PHOSPHATE 10 MG/ML IJ SOLN
INTRAMUSCULAR | Status: DC | PRN
  Administered 2015-01-05: 10 mg via INTRAVENOUS

## 2015-01-05 MED ORDER — MORPHINE SULFATE 2 MG/ML IJ SOLN
2.0000 mg | INTRAMUSCULAR | Status: DC | PRN
  Administered 2015-01-05 – 2015-01-06 (×2): 2 mg via INTRAVENOUS
  Filled 2015-01-05 (×2): qty 1

## 2015-01-05 MED ORDER — BUPIVACAINE-EPINEPHRINE (PF) 0.25% -1:200000 IJ SOLN
INTRAMUSCULAR | Status: AC
  Filled 2015-01-05: qty 30

## 2015-01-05 MED ORDER — KETOROLAC TROMETHAMINE 15 MG/ML IJ SOLN
INTRAMUSCULAR | Status: AC
  Filled 2015-01-05: qty 1

## 2015-01-05 MED ORDER — LORAZEPAM 0.5 MG PO TABS
0.5000 mg | ORAL_TABLET | Freq: Four times a day (QID) | ORAL | Status: DC | PRN
  Administered 2015-01-05 – 2015-01-06 (×3): 0.5 mg via ORAL
  Filled 2015-01-05 (×3): qty 1

## 2015-01-05 MED ORDER — LATANOPROST 0.005 % OP SOLN
1.0000 [drp] | Freq: Every day | OPHTHALMIC | Status: DC
  Administered 2015-01-05 – 2015-01-06 (×2): 1 [drp] via OPHTHALMIC
  Filled 2015-01-05 (×2): qty 2.5

## 2015-01-05 MED ORDER — PANTOPRAZOLE SODIUM 40 MG PO TBEC
40.0000 mg | DELAYED_RELEASE_TABLET | Freq: Every day | ORAL | Status: DC
  Administered 2015-01-05 – 2015-01-07 (×3): 40 mg via ORAL
  Filled 2015-01-05 (×3): qty 1

## 2015-01-05 MED ORDER — PROMETHAZINE HCL 25 MG/ML IJ SOLN: 6.2500 mg | INTRAMUSCULAR | Status: DC | PRN

## 2015-01-05 MED ORDER — FENTANYL CITRATE (PF) 100 MCG/2ML IJ SOLN
INTRAMUSCULAR | Status: DC | PRN
  Administered 2015-01-05 (×5): 50 ug via INTRAVENOUS
  Administered 2015-01-05: 100 ug via INTRAVENOUS

## 2015-01-05 MED ORDER — IRBESARTAN 150 MG PO TABS: 150.0000 mg | ORAL_TABLET | Freq: Every day | ORAL | Status: DC

## 2015-01-05 MED ORDER — LABETALOL HCL 5 MG/ML IV SOLN
INTRAVENOUS | Status: AC
  Filled 2015-01-05: qty 4

## 2015-01-05 MED ORDER — ACETAMINOPHEN 650 MG RE SUPP: 650.0000 mg | Freq: Four times a day (QID) | RECTAL | Status: DC | PRN

## 2015-01-05 MED ORDER — LACTATED RINGERS IV SOLN
INTRAVENOUS | Status: DC
  Administered 2015-01-05 (×3): via INTRAVENOUS

## 2015-01-05 MED ORDER — HYDROMORPHONE HCL 2 MG PO TABS
2.0000 mg | ORAL_TABLET | ORAL | Status: DC | PRN
  Administered 2015-01-05: 4 mg via ORAL
  Administered 2015-01-05 (×2): 2 mg via ORAL
  Administered 2015-01-06: 4 mg via ORAL
  Administered 2015-01-06: 2 mg via ORAL
  Administered 2015-01-06 – 2015-01-07 (×2): 4 mg via ORAL
  Filled 2015-01-05: qty 1
  Filled 2015-01-05 (×3): qty 2
  Filled 2015-01-05: qty 1
  Filled 2015-01-05: qty 2

## 2015-01-05 MED ORDER — PROPOFOL 10 MG/ML IV BOLUS
INTRAVENOUS | Status: AC
  Filled 2015-01-05: qty 20

## 2015-01-05 MED ORDER — DEXAMETHASONE SODIUM PHOSPHATE 10 MG/ML IJ SOLN
INTRAMUSCULAR | Status: AC
  Filled 2015-01-05: qty 1

## 2015-01-05 MED ORDER — INSULIN ASPART 100 UNIT/ML ~~LOC~~ SOLN
0.0000 [IU] | Freq: Three times a day (TID) | SUBCUTANEOUS | Status: DC
  Administered 2015-01-05: 5 [IU] via SUBCUTANEOUS
  Administered 2015-01-06: 3 [IU] via SUBCUTANEOUS
  Administered 2015-01-06: 2 [IU] via SUBCUTANEOUS
  Administered 2015-01-07: 3 [IU] via SUBCUTANEOUS

## 2015-01-05 MED ORDER — LIDOCAINE HCL (CARDIAC) 20 MG/ML IV SOLN
INTRAVENOUS | Status: AC
  Filled 2015-01-05: qty 5

## 2015-01-05 MED ORDER — HYDROMORPHONE HCL 1 MG/ML IJ SOLN
INTRAMUSCULAR | Status: AC
  Filled 2015-01-05: qty 1

## 2015-01-05 MED ORDER — HYDROMORPHONE HCL 1 MG/ML IJ SOLN
0.2500 mg | INTRAMUSCULAR | Status: DC | PRN
  Administered 2015-01-05 (×2): 0.5 mg via INTRAVENOUS

## 2015-01-05 MED ORDER — ONDANSETRON HCL 4 MG/2ML IJ SOLN
4.0000 mg | Freq: Four times a day (QID) | INTRAMUSCULAR | Status: DC | PRN
  Administered 2015-01-05: 4 mg via INTRAVENOUS
  Filled 2015-01-05: qty 2

## 2015-01-05 MED ORDER — MEPERIDINE HCL 25 MG/ML IJ SOLN: 6.2500 mg | INTRAMUSCULAR | Status: DC | PRN

## 2015-01-05 MED ORDER — 0.9 % SODIUM CHLORIDE (POUR BTL) OPTIME
TOPICAL | Status: DC | PRN
  Administered 2015-01-05: 1000 mL

## 2015-01-05 MED ORDER — CEFAZOLIN SODIUM-DEXTROSE 2-3 GM-% IV SOLR
2.0000 g | INTRAVENOUS | Status: AC
  Administered 2015-01-05: 2 g via INTRAVENOUS
  Filled 2015-01-05: qty 50

## 2015-01-05 MED ORDER — VITAMIN E 45 MG (100 UNIT) PO CAPS
100.0000 [IU] | ORAL_CAPSULE | Freq: Every day | ORAL | Status: DC
  Administered 2015-01-06 – 2015-01-07 (×2): 100 [IU] via ORAL
  Filled 2015-01-05 (×3): qty 1

## 2015-01-05 MED ORDER — MIDAZOLAM HCL 2 MG/2ML IJ SOLN
INTRAMUSCULAR | Status: AC
  Filled 2015-01-05: qty 2

## 2015-01-05 MED ORDER — BUDESONIDE-FORMOTEROL FUMARATE 80-4.5 MCG/ACT IN AERO
2.0000 | INHALATION_SPRAY | Freq: Two times a day (BID) | RESPIRATORY_TRACT | Status: DC
  Administered 2015-01-06 – 2015-01-07 (×3): 2 via RESPIRATORY_TRACT
  Filled 2015-01-05 (×2): qty 6.9

## 2015-01-05 MED ORDER — ZOLPIDEM TARTRATE 5 MG PO TABS
10.0000 mg | ORAL_TABLET | Freq: Every day | ORAL | Status: DC
  Administered 2015-01-05 – 2015-01-06 (×2): 10 mg via ORAL
  Filled 2015-01-05 (×2): qty 2

## 2015-01-05 MED ORDER — SODIUM CHLORIDE 0.9 % IR SOLN
Status: DC | PRN
  Administered 2015-01-05: 3000 mL

## 2015-01-05 MED ORDER — DIPHENHYDRAMINE HCL 12.5 MG/5ML PO ELIX: 12.5000 mg | ORAL_SOLUTION | ORAL | Status: DC | PRN

## 2015-01-05 MED ORDER — BUPIVACAINE-EPINEPHRINE 0.25% -1:200000 IJ SOLN
INTRAMUSCULAR | Status: DC | PRN
  Administered 2015-01-05: 30 mL

## 2015-01-05 MED ORDER — BACLOFEN 10 MG PO TABS: 20.0000 mg | ORAL_TABLET | Freq: Every day | ORAL | Status: DC | PRN

## 2015-01-05 MED ORDER — BUPIVACAINE-EPINEPHRINE (PF) 0.5% -1:200000 IJ SOLN
INTRAMUSCULAR | Status: DC | PRN
  Administered 2015-01-05: 30 mL via PERINEURAL

## 2015-01-05 MED ORDER — CEFUROXIME SODIUM 1.5 G IJ SOLR
INTRAMUSCULAR | Status: DC | PRN
  Administered 2015-01-05: 1.5 g

## 2015-01-05 MED ORDER — METOCLOPRAMIDE HCL 5 MG PO TABS: 5.0000 mg | ORAL_TABLET | Freq: Three times a day (TID) | ORAL | Status: DC | PRN

## 2015-01-05 MED ORDER — PROPOFOL 10 MG/ML IV BOLUS
INTRAVENOUS | Status: DC | PRN
  Administered 2015-01-05: 200 mg via INTRAVENOUS

## 2015-01-05 MED ORDER — ONDANSETRON HCL 4 MG/2ML IJ SOLN
INTRAMUSCULAR | Status: DC | PRN
Start: 2015-01-05 — End: 2015-01-05
  Administered 2015-01-05: 4 mg via INTRAVENOUS

## 2015-01-05 MED ORDER — PRAVASTATIN SODIUM 40 MG PO TABS
80.0000 mg | ORAL_TABLET | Freq: Every day | ORAL | Status: DC
  Administered 2015-01-05 – 2015-01-06 (×2): 80 mg via ORAL
  Filled 2015-01-05 (×2): qty 2

## 2015-01-05 MED ORDER — KETOROLAC TROMETHAMINE 15 MG/ML IJ SOLN
7.5000 mg | Freq: Four times a day (QID) | INTRAMUSCULAR | Status: AC
  Administered 2015-01-05 – 2015-01-06 (×4): 7.5 mg via INTRAVENOUS
  Filled 2015-01-05 (×3): qty 1

## 2015-01-05 MED ORDER — ACETAMINOPHEN 325 MG PO TABS: 650.0000 mg | ORAL_TABLET | Freq: Four times a day (QID) | ORAL | Status: DC | PRN

## 2015-01-05 MED ORDER — FENTANYL CITRATE (PF) 100 MCG/2ML IJ SOLN
INTRAMUSCULAR | Status: AC
  Administered 2015-01-05: 100 ug
  Filled 2015-01-05: qty 2

## 2015-01-05 MED ORDER — FENTANYL CITRATE (PF) 250 MCG/5ML IJ SOLN
INTRAMUSCULAR | Status: AC
  Filled 2015-01-05: qty 5

## 2015-01-05 MED ORDER — CEFUROXIME SODIUM 1.5 G IJ SOLR
INTRAMUSCULAR | Status: AC
  Filled 2015-01-05: qty 1.5

## 2015-01-05 MED ORDER — ONDANSETRON HCL 4 MG PO TABS: 4.0000 mg | ORAL_TABLET | Freq: Four times a day (QID) | ORAL | Status: DC | PRN

## 2015-01-05 MED ORDER — DOCUSATE SODIUM 100 MG PO CAPS
100.0000 mg | ORAL_CAPSULE | Freq: Two times a day (BID) | ORAL | Status: DC
  Administered 2015-01-05 – 2015-01-07 (×4): 100 mg via ORAL
  Filled 2015-01-05 (×5): qty 1

## 2015-01-05 MED ORDER — MIDAZOLAM HCL 5 MG/5ML IJ SOLN
INTRAMUSCULAR | Status: DC | PRN
  Administered 2015-01-05: 2 mg via INTRAVENOUS

## 2015-01-05 MED ORDER — PHENOL 1.4 % MT LIQD: 1.0000 | OROMUCOSAL | Status: DC | PRN

## 2015-01-05 MED ORDER — MENTHOL 3 MG MT LOZG: 1.0000 | LOZENGE | OROMUCOSAL | Status: DC | PRN

## 2015-01-05 MED ORDER — LABETALOL HCL 5 MG/ML IV SOLN
INTRAVENOUS | Status: DC | PRN
  Administered 2015-01-05 (×2): 5 mg via INTRAVENOUS
  Administered 2015-01-05: 2.5 mg via INTRAVENOUS

## 2015-01-05 MED ORDER — APIXABAN 2.5 MG PO TABS
2.5000 mg | ORAL_TABLET | Freq: Two times a day (BID) | ORAL | Status: DC
  Administered 2015-01-06 – 2015-01-07 (×3): 2.5 mg via ORAL
  Filled 2015-01-05 (×3): qty 1

## 2015-01-05 MED ORDER — ALBUTEROL SULFATE (2.5 MG/3ML) 0.083% IN NEBU: 3.0000 mL | INHALATION_SOLUTION | Freq: Four times a day (QID) | RESPIRATORY_TRACT | Status: DC | PRN

## 2015-01-05 MED ORDER — INSULIN ASPART 100 UNIT/ML ~~LOC~~ SOLN: 0.0000 [IU] | Freq: Every day | SUBCUTANEOUS | Status: DC

## 2015-01-05 MED ORDER — POLYETHYLENE GLYCOL 3350 17 G PO PACK
17.0000 g | PACK | Freq: Two times a day (BID) | ORAL | Status: DC
  Administered 2015-01-05 – 2015-01-07 (×3): 17 g via ORAL
  Filled 2015-01-05 (×4): qty 1

## 2015-01-05 MED ORDER — MIDAZOLAM HCL 2 MG/2ML IJ SOLN: 0.5000 mg | Freq: Once | INTRAMUSCULAR | Status: DC | PRN

## 2015-01-05 MED ORDER — CEFAZOLIN SODIUM-DEXTROSE 2-3 GM-% IV SOLR
2.0000 g | Freq: Four times a day (QID) | INTRAVENOUS | Status: AC
  Administered 2015-01-05 (×2): 2 g via INTRAVENOUS
  Filled 2015-01-05 (×3): qty 50

## 2015-01-05 MED ORDER — INSULIN GLARGINE 100 UNIT/ML ~~LOC~~ SOLN
10.0000 [IU] | Freq: Every day | SUBCUTANEOUS | Status: DC
  Administered 2015-01-05 – 2015-01-06 (×2): 10 [IU] via SUBCUTANEOUS
  Filled 2015-01-05 (×3): qty 0.1

## 2015-01-05 MED ORDER — ALUM & MAG HYDROXIDE-SIMETH 200-200-20 MG/5ML PO SUSP: 30.0000 mL | ORAL | Status: DC | PRN

## 2015-01-05 MED ORDER — DEXAMETHASONE SODIUM PHOSPHATE 10 MG/ML IJ SOLN
10.0000 mg | Freq: Three times a day (TID) | INTRAMUSCULAR | Status: DC
  Administered 2015-01-05 – 2015-01-07 (×6): 10 mg via INTRAVENOUS
  Filled 2015-01-05 (×5): qty 1

## 2015-01-05 MED ORDER — ONDANSETRON HCL 4 MG/2ML IJ SOLN
INTRAMUSCULAR | Status: AC
  Filled 2015-01-05: qty 2

## 2015-01-05 SURGICAL SUPPLY — 66 items
BANDAGE ESMARK 6X9 LF (GAUZE/BANDAGES/DRESSINGS) ×1 IMPLANT
BENZOIN TINCTURE PRP APPL 2/3 (GAUZE/BANDAGES/DRESSINGS) ×2 IMPLANT
BLADE SAGITTAL 25.0X1.19X90 (BLADE) ×2 IMPLANT
BLADE SAW SGTL 11.0X1.19X90.0M (BLADE) IMPLANT
BLADE SAW SGTL 13.0X1.19X90.0M (BLADE) ×2 IMPLANT
BLADE SURG 10 STRL SS (BLADE) ×4 IMPLANT
BNDG ELASTIC 6X15 VLCR STRL LF (GAUZE/BANDAGES/DRESSINGS) ×2 IMPLANT
BNDG ESMARK 6X9 LF (GAUZE/BANDAGES/DRESSINGS) ×2
BOWL SMART MIX CTS (DISPOSABLE) ×2 IMPLANT
CAPT KNEE TOTAL 3 ATTUNE ×2 IMPLANT
CEMENT HV SMART SET (Cement) ×4 IMPLANT
COVER SURGICAL LIGHT HANDLE (MISCELLANEOUS) ×2 IMPLANT
CUFF TOURNIQUET SINGLE 34IN LL (TOURNIQUET CUFF) ×2 IMPLANT
CUFF TOURNIQUET SINGLE 44IN (TOURNIQUET CUFF) IMPLANT
DRAPE EXTREMITY T 121X128X90 (DRAPE) ×2 IMPLANT
DRAPE IMP U-DRAPE 54X76 (DRAPES) ×2 IMPLANT
DRAPE INCISE IOBAN 66X45 STRL (DRAPES) ×2 IMPLANT
DRAPE PROXIMA HALF (DRAPES) ×2 IMPLANT
DRAPE U-SHAPE 47X51 STRL (DRAPES) ×2 IMPLANT
DRSG AQUACEL AG ADV 3.5X14 (GAUZE/BANDAGES/DRESSINGS) ×2 IMPLANT
DRSG PAD ABDOMINAL 8X10 ST (GAUZE/BANDAGES/DRESSINGS) ×4 IMPLANT
DURAPREP 26ML APPLICATOR (WOUND CARE) ×2 IMPLANT
ELECT CAUTERY BLADE 6.4 (BLADE) ×2 IMPLANT
ELECT REM PT RETURN 9FT ADLT (ELECTROSURGICAL) ×2
ELECTRODE REM PT RTRN 9FT ADLT (ELECTROSURGICAL) ×1 IMPLANT
EVACUATOR 1/8 PVC DRAIN (DRAIN) ×2 IMPLANT
FACESHIELD WRAPAROUND (MASK) ×4 IMPLANT
GAUZE SPONGE 4X4 12PLY STRL (GAUZE/BANDAGES/DRESSINGS) ×2 IMPLANT
GLOVE BIO SURGEON STRL SZ7 (GLOVE) ×2 IMPLANT
GLOVE BIOGEL PI IND STRL 7.0 (GLOVE) ×1 IMPLANT
GLOVE BIOGEL PI IND STRL 7.5 (GLOVE) ×1 IMPLANT
GLOVE BIOGEL PI INDICATOR 7.0 (GLOVE) ×1
GLOVE BIOGEL PI INDICATOR 7.5 (GLOVE) ×1
GLOVE SS BIOGEL STRL SZ 7.5 (GLOVE) ×1 IMPLANT
GLOVE SUPERSENSE BIOGEL SZ 7.5 (GLOVE) ×1
GOWN STRL REUS W/ TWL LRG LVL3 (GOWN DISPOSABLE) ×2 IMPLANT
GOWN STRL REUS W/ TWL XL LVL3 (GOWN DISPOSABLE) ×2 IMPLANT
GOWN STRL REUS W/TWL LRG LVL3 (GOWN DISPOSABLE) ×2
GOWN STRL REUS W/TWL XL LVL3 (GOWN DISPOSABLE) ×2
HANDPIECE INTERPULSE COAX TIP (DISPOSABLE) ×1
HOOD PEEL AWAY FACE SHEILD DIS (HOOD) ×4 IMPLANT
IMMOBILIZER KNEE 22 UNIV (SOFTGOODS) ×2 IMPLANT
KIT BASIN OR (CUSTOM PROCEDURE TRAY) ×2 IMPLANT
KIT ROOM TURNOVER OR (KITS) ×2 IMPLANT
MANIFOLD NEPTUNE II (INSTRUMENTS) ×2 IMPLANT
MARKER SKIN DUAL TIP RULER LAB (MISCELLANEOUS) ×2 IMPLANT
NS IRRIG 1000ML POUR BTL (IV SOLUTION) ×2 IMPLANT
PACK TOTAL JOINT (CUSTOM PROCEDURE TRAY) ×2 IMPLANT
PACK UNIVERSAL I (CUSTOM PROCEDURE TRAY) ×2 IMPLANT
PAD ARMBOARD 7.5X6 YLW CONV (MISCELLANEOUS) ×4 IMPLANT
PADDING CAST COTTON 6X4 STRL (CAST SUPPLIES) ×2 IMPLANT
RUBBERBAND STERILE (MISCELLANEOUS) ×2 IMPLANT
SET HNDPC FAN SPRY TIP SCT (DISPOSABLE) ×1 IMPLANT
STRIP CLOSURE SKIN 1/2X4 (GAUZE/BANDAGES/DRESSINGS) ×2 IMPLANT
SUCTION FRAZIER TIP 10 FR DISP (SUCTIONS) ×2 IMPLANT
SUT ETHIBOND NAB CT1 #1 30IN (SUTURE) ×4 IMPLANT
SUT MNCRL AB 3-0 PS2 18 (SUTURE) ×2 IMPLANT
SUT VIC AB 0 CT1 27 (SUTURE) ×3
SUT VIC AB 0 CT1 27XBRD ANBCTR (SUTURE) ×3 IMPLANT
SUT VIC AB 2-0 CT1 27 (SUTURE) ×2
SUT VIC AB 2-0 CT1 TAPERPNT 27 (SUTURE) ×2 IMPLANT
SYR 30ML SLIP (SYRINGE) ×2 IMPLANT
TOWEL OR 17X24 6PK STRL BLUE (TOWEL DISPOSABLE) ×2 IMPLANT
TOWEL OR 17X26 10 PK STRL BLUE (TOWEL DISPOSABLE) ×2 IMPLANT
TRAY FOLEY CATH 16FR SILVER (SET/KITS/TRAYS/PACK) ×2 IMPLANT
WATER STERILE IRR 1000ML POUR (IV SOLUTION) ×2 IMPLANT

## 2015-01-05 NOTE — Anesthesia Preprocedure Evaluation (Addendum)
Anesthesia Evaluation  Patient identified by MRN, date of birth, ID band Patient awake    Reviewed: Allergy & Precautions, NPO status , Patient's Chart, lab work & pertinent test results  History of Anesthesia Complications Negative for: history of anesthetic complications  Airway Mallampati: II  TM Distance: >3 FB Neck ROM: Full    Dental  (+) Partial Lower, Partial Upper, Dental Advisory Given   Pulmonary asthma , former smoker,  breath sounds clear to auscultation        Cardiovascular hypertension, Pt. on medications - anginaRhythm:Regular Rate:Normal     Neuro/Psych PSYCHIATRIC DISORDERS (schizoaffective) Anxiety Depression    GI/Hepatic Neg liver ROS, GERD-  Medicated and Controlled,  Endo/Other  diabetes (glu 101), Oral Hypoglycemic Agents  Renal/GU negative Renal ROS     Musculoskeletal  (+) Arthritis -,   Abdominal   Peds  Hematology   Anesthesia Other Findings   Reproductive/Obstetrics                           Anesthesia Physical Anesthesia Plan  ASA: III  Anesthesia Plan: General   Post-op Pain Management: GA combined w/ Regional for post-op pain   Induction: Intravenous  Airway Management Planned: LMA  Additional Equipment:   Intra-op Plan:   Post-operative Plan:   Informed Consent: I have reviewed the patients History and Physical, chart, labs and discussed the procedure including the risks, benefits and alternatives for the proposed anesthesia with the patient or authorized representative who has indicated his/her understanding and acceptance.   Dental advisory given  Plan Discussed with: CRNA and Surgeon  Anesthesia Plan Comments: (Plan routine monitors, GA- LMA OK, adductor canal block for post op analgesia)        Anesthesia Quick Evaluation

## 2015-01-05 NOTE — Anesthesia Postprocedure Evaluation (Signed)
  Anesthesia Post-op Note  Patient: Brooke Davidson  Procedure(s) Performed: Procedure(s): TOTAL KNEE ARTHROPLASTY (Left)  Patient Location: PACU  Anesthesia Type:GA combined with regional for post-op pain  Level of Consciousness: awake, alert , oriented and patient cooperative  Airway and Oxygen Therapy: Patient Spontanous Breathing and Patient connected to nasal cannula oxygen  Post-op Pain: mild  Post-op Assessment: Post-op Vital signs reviewed, Patient's Cardiovascular Status Stable, Respiratory Function Stable, Patent Airway, No signs of Nausea or vomiting and Pain level controlled              Post-op Vital Signs: Reviewed and stable  Last Vitals:  Filed Vitals:   01/05/15 1400  BP:   Pulse: 67  Temp: 36.4 C  Resp: 17    Complications: No apparent anesthesia complications

## 2015-01-05 NOTE — Progress Notes (Signed)
Urethral catheter removed per patient request.  Patient states that catheter was causing abdominal discomfort.  Patient educated but still requested to have catheter removed.

## 2015-01-05 NOTE — Progress Notes (Signed)
Dr Jean Rosenthal called regarding continued elevated BP. Latest BP 184/88. Comfortable with diastolic in the 80's. No new orders given at this time.

## 2015-01-05 NOTE — Anesthesia Procedure Notes (Addendum)
Anesthesia Regional Block:  Adductor canal block  Pre-Anesthetic Checklist: ,, timeout performed, Correct Patient, Correct Site, Correct Laterality, Correct Procedure, Correct Position, site marked, Risks and benefits discussed,  Surgical consent,  Pre-op evaluation,  At surgeon's request and post-op pain management  Laterality: Left and Lower  Prep: chloraprep       Needles:  Injection technique: Single-shot     Needle Length: 9cm 9 cm Needle Gauge: 22 and 22 G    Additional Needles:  Procedures: ultrasound guided (picture in chart) Adductor canal block Narrative:  Start time: 01/05/2015 10:00 AM End time: 01/05/2015 10:05 AM Injection made incrementally with aspirations every 5 mL.  Performed by: Personally  Anesthesiologist: Jean Rosenthal, C[place]  Additional Notes: Pt identified in Holding room.  Monitors applied. Working IV access confirmed. Sterile prep, drape L thigh.  #22ga ECHOgenic needle into adductor canal with US guidance.  30cc 0.5% Bupivacaine with 1:200k epi injected incrementally after negative test dose, good spread of local anesthetic.  Patient asymptomatic, VSS, no heme aspirated, tolerated well.  Sandford Craze, MD   Procedure Name: LMA Insertion Date/Time: 01/05/2015 11:00 AM Performed by: Lovie Chol Pre-anesthesia Checklist: Patient identified, Emergency Drugs available, Suction available, Patient being monitored and Timeout performed Patient Re-evaluated:Patient Re-evaluated prior to inductionOxygen Delivery Method: Circle system utilized Preoxygenation: Pre-oxygenation with 100% oxygen Intubation Type: IV induction Ventilation: Mask ventilation without difficulty LMA: LMA inserted LMA Size: 4.0 Number of attempts: 1 Placement Confirmation: positive ETCO2,  CO2 detector and breath sounds checked- equal and bilateral Tube secured with: Tape Dental Injury: Teeth and Oropharynx as per pre-operative assessment

## 2015-01-05 NOTE — Interval H&P Note (Signed)
History and Physical Interval Note:  01/05/2015 10:42 AM  Brooke Davidson  has presented today for surgery, with the diagnosis of PRIMARY LOCALIZED OA LEFT KNEE  The various methods of treatment have been discussed with the patient and family. After consideration of risks, benefits and other options for treatment, the patient has consented to  Procedure(s): TOTAL KNEE ARTHROPLASTY (Left) as a surgical intervention .  The patient's history has been reviewed, patient examined, no change in status, stable for surgery.  I have reviewed the patient's chart and labs.  Questions were answered to the patient's satisfaction.     Salvatore Marvel A

## 2015-01-05 NOTE — Evaluation (Signed)
Physical Therapy Evaluation Patient Details Name: Brooke Davidson MRN: [id number] DOB: [birth year] Today's Date: 01/05/2015   History of Present Illness  L TKA  Clinical Impression  Pt is s/p TKA resulting in the deficits listed below (see PT Problem List).  Pt will benefit from skilled PT to increase their independence and safety with mobility to allow discharge to SNF. Patient reports living alone and will require further rehabilitation before returning home alone.       Follow Up Recommendations SNF    Equipment Recommendations  None recommended by PT (to be assessed at [facility])    Recommendations for Other Services       Precautions / Restrictions Precautions Precautions: Knee;Fall Required Braces or Orthoses: Knee Immobilizer - Left Knee Immobilizer - Left: Other (comment) (not specified) Restrictions Weight Bearing Restrictions: Yes LLE Weight Bearing: Weight bearing as tolerated      Mobility  Bed Mobility Overal bed mobility: Needs Assistance Bed Mobility: Supine to Sit     Supine to sit: Min guard        Transfers Overall transfer level: Needs assistance Equipment used: Rolling walker (2 wheeled) Transfers: Sit to/from Stand Sit to Stand: Min assist         General transfer comment: cues for hand placement  Ambulation/Gait Ambulation/Gait assistance: Min assist Ambulation Distance (Feet): 25 Feet Assistive device: Rolling walker (2 wheeled) Gait Pattern/deviations: Decreased step length - left;Decreased weight shift to left   Gait velocity interpretation: Below normal speed for age/gender    Stairs            Wheelchair Mobility    Modified Rankin (Stroke Patients Only)       Balance Overall balance assessment: Needs assistance Sitting-balance support: No upper extremity supported Sitting balance-Leahy Scale: Fair     Standing balance support: Bilateral upper extremity supported Standing balance-Leahy Scale: Poor                               Pertinent Vitals/Pain Pain Assessment: 0-10 Pain Score: 7  Pain Location: Lt knee Pain Descriptors / Indicators: Aching;Sore Pain Intervention(s): Monitored during session;Repositioned    Home Living Family/patient expects to be discharged to:: Skilled nursing facility Living Arrangements: Alone               Additional Comments: patient reports living alone but planning to go for further rehab after the hospital    Prior Function Level of Independence: Independent               Hand Dominance        Extremity/Trunk Assessment               Lower Extremity Assessment: LLE deficits/detail   LLE Deficits / Details: unable to perform SLR     Communication   Communication: No difficulties  Cognition Arousal/Alertness: Awake/alert Behavior During Therapy: Anxious Overall Cognitive Status: Within Functional Limits for tasks assessed                      General Comments      Exercises        Assessment/Plan    PT Assessment Patient needs continued PT services  PT Diagnosis Difficulty walking;Generalized weakness   PT Problem List Decreased strength;Decreased range of motion;Decreased activity tolerance;Decreased balance;Decreased mobility;Decreased coordination;Decreased knowledge of use of DME;Decreased safety awareness  PT Treatment Interventions DME instruction;Stair training;Gait training;Functional mobility training;Therapeutic activities;Therapeutic exercise;Balance training;Patient/family education  PT Goals (Current goals can be found in the Care Plan section) Acute Rehab PT Goals Patient Stated Goal: go for more rehab after the hospital PT Goal Formulation: With patient Time For Goal Achievement: 01/19/15 Potential to Achieve Goals: Good    Frequency 7X/week   Barriers to discharge        Co-evaluation               End of Session Equipment Utilized During Treatment: Gait belt;Left knee  immobilizer Activity Tolerance: Patient tolerated treatment well Patient left: in chair;with call bell/phone within reach;with bed alarm set;with chair alarm set;with family/visitor present Nurse Communication: Mobility status         Time: 1519-1550 PT Time Calculation (min) (ACUTE ONLY): 31 min   Charges:   PT Evaluation $Initial PT Evaluation Tier I: 1 Procedure PT Treatments $Gait Training: 8-22 mins   PT G Codes:        Christiane Ha, PT, CSCS Pager [phone] Office [phone]  01/05/2015, 4:09 PM

## 2015-01-05 NOTE — Progress Notes (Signed)
Informed Kirsten, PA, patient c/o ace wrap too tight. Confirmed toes warm/ without swelling, tibial pulse RLE +2 - see assessment. Recommended following PRN pain Rx and provide Ativan for anxiety as indicated. Kirsten encouraged to call if this RN has further concerns or needs Rx adjustments to provide comfort for patient's recovery post-op.

## 2015-01-05 NOTE — Progress Notes (Signed)
Utilization review completed.  

## 2015-01-05 NOTE — Progress Notes (Signed)
Orthopedic Tech Progress Note Patient Details:  Brooke Davidson May 13, 1946 [id number] Applied CPM to LLE. CPM Left Knee CPM Left Knee: On Left Knee Flexion (Degrees): 90 Left Knee Extension (Degrees): 0   [name] 01/05/2015, 7:28 PM

## 2015-01-05 NOTE — Op Note (Signed)
MRN:     [id number] DOB/AGE:    07/26/1945 / 69 y.o.       OPERATIVE REPORT    DATE OF PROCEDURE:  01/05/2015       PREOPERATIVE DIAGNOSIS:   PRIMARY LOCALIZED OA LEFT KNEE      Estimated body mass index is 27.34 kg/(m^2) as calculated from the following:   Height as of this encounter:  (1.6 m).   Weight as of this encounter: 69.99 kg (154 lb 4.8 oz).                                                        POSTOPERATIVE DIAGNOSIS:   SAME                                                                      PROCEDURE:  Procedure(s): TOTAL KNEE ARTHROPLASTY Using Depuy Attune RP implants #4 Femur, #4Tibia, 7mm attune RP bearing, 32 Patella     SURGEON: Finas Delone A    ASSISTANT:  Kirstin Shepperson PA-C   (Present and scrubbed throughout the case, critical for assistance with exposure, retraction, instrumentation, and closure.)         ANESTHESIA: GET with Femoral Nerve Block  DRAINS: foley, 2 medium hemovac in knee   TOURNIQUET TIME:   COMPLICATIONS:  None     SPECIMENS: None   INDICATIONS FOR PROCEDURE: The patient has  DJD LEFT KNEE, varus deformities, XR shows bone on bone arthritis. Patient has failed all conservative measures including anti-inflammatory medicines, narcotics, attempts at  exercise and weight loss, cortisone injections and viscosupplementation.  Risks and benefits of surgery have been discussed, questions answered.   DESCRIPTION OF PROCEDURE: The patient identified by armband, received  right femoral nerve block and IV antibiotics, in the holding area at [facility]. Patient taken to the operating room, appropriate anesthetic  monitors were attached General endotracheal anesthesia induced with  the patient in supine position, Foley catheter was inserted. Tourniquet  applied high to the operative thigh. Lateral post and foot positioner  applied to the table, the lower extremity was then prepped and draped  in usual sterile fashion from the  ankle to the tourniquet. Time-out procedure was performed. The limb was wrapped with an Esmarch bandage and the tourniquet inflated to 365 mmHg. We began the operation by making the anterior midline incision starting at handbreadth above the patella going over the patella 1 cm medial to and  4 cm distal to the tibial tubercle. Small bleeders in the skin and the  subcutaneous tissue identified and cauterized. Transverse retinaculum was incised and reflected medially and a medial parapatellar arthrotomy was accomplished. the patella was everted and theprepatellar fat pad resected. The superficial medial collateral  ligament was then elevated from anterior to posterior along the proximal  flare of the tibia and anterior half of the menisci resected. The knee was hyperflexed exposing bone on bone arthritis. Peripheral and notch osteophytes as well as the cruciate ligaments were then resected. We continued to  work our way around posteriorly along the proximal tibia,  and externally  rotated the tibia subluxing it out from underneath the femur. A McHale  retractor was placed through the notch and a lateral Hohmann retractor  placed, and we then drilled through the proximal tibia in line with the  axis of the tibia followed by an intramedullary guide rod and 2-degree  posterior slope cutting guide. The tibial cutting guide was pinned into place  allowing resection of 4 mm of bone medially and about 6 mm of bone  laterally because of her varus deformity. Satisfied with the tibial resection, we then  entered the distal femur 2 mm anterior to the PCL origin with the  intramedullary guide rod and applied the distal femoral cutting guide  set at 11mm, with 5 degrees of valgus. This was pinned along the  epicondylar axis. At this point, the distal femoral cut was accomplished without difficulty. We then sized for a #4 femoral component and pinned the guide in 3 degrees of external rotation.The chamfer cutting  guide was pinned into place. The anterior, posterior, and chamfer cuts were accomplished without difficulty followed by  the Attune RP box cutting guide and the box cut. We also removed posterior osteophytes from the posterior femoral condyles. At this  time, the knee was brought into full extension. We checked our  extension and flexion gaps and found them symmetric at 7mm.  The patella thickness measured at 21 mm. We set the cutting guide at 13 and removed the posterior 8 mm  of the patella sized for 32 button and drilled the lollipop. The knee  was then once again hyperflexed exposing the proximal tibia. We sized for a #4 tibial base plate, applied the smokestack and the conical reamer followed by the the Delta fin keel punch. We then hammered into place the Attune RP trial femoral component, inserted a 7-mm trial bearing, trial patellar button, and took the knee through range of motion from 0-130 degrees. No thumb pressure was required for patellar  tracking. At this point, all trial components were removed, a double batch of DePuy HV cement with 1500 mg of Zinacef was mixed and applied to all bony metallic mating surfaces except for the posterior condyles of the femur itself. In order, we  hammered into place the tibial tray and removed excess cement, the femoral component and removed excess cement, a 7-mm Attune RP bearing  was inserted, and the knee brought to full extension with compression.  The patellar button was clamped into place, and excess cement  removed. While the cement cured the wound was irrigated out with normal saline solution pulse lavage, and medium Hemovac drains were placed.. Ligament stability and patellar tracking were checked and found to be excellent. The tourniquet was then released and hemostasis was obtained with cautery. The parapatellar arthrotomy was closed with  #1 ethibond suture. The subcutaneous tissue with 0 and 2-0 undyed  Vicryl suture, and 4-0 Monocryl.. A  dressing of Xeroform,  4 x 4, dressing sponges, Webril, and Ace wrap applied. Needle and sponge count were correct times 2.The patient awakened, extubated, and taken to recovery room without difficulty. Vascular status was normal, pulses 2+ and symmetric.   Lowanda Cashaw A 01/05/2015, 12:23 PM

## 2015-01-05 NOTE — Transfer of Care (Signed)
Immediate Anesthesia Transfer of Care Note  Patient: Brooke Davidson  Procedure(s) Performed: Procedure(s): TOTAL KNEE ARTHROPLASTY (Left)  Patient Location: PACU  Anesthesia Type:General  Level of Consciousness: awake, oriented and patient cooperative  Airway & Oxygen Therapy: Patient Spontanous Breathing and Patient connected to nasal cannula oxygen  Post-op Assessment: Report given to RN and Post -op Vital signs reviewed and stable  Post vital signs: Reviewed  Last Vitals:  Filed Vitals:   01/05/15 1306  BP: 170/96  Pulse: 70  Temp:   Resp: 19    Complications: No apparent anesthesia complications

## 2015-01-06 ENCOUNTER — Encounter (HOSPITAL_COMMUNITY): Payer: Self-pay | Admitting: Family

## 2015-01-06 LAB — BASIC METABOLIC PANEL
Anion gap: 9 (ref 5–15)
BUN: 17 mg/dL (ref 6–20)
CALCIUM: 8.7 mg/dL — AB (ref 8.9–10.3)
CHLORIDE: 104 mmol/L (ref 101–111)
CO2: 22 mmol/L (ref 22–32)
Creatinine, Ser: 1.03 mg/dL — ABNORMAL HIGH (ref 0.44–1.00)
GFR calc non Af Amer: 55 mL/min — ABNORMAL LOW (ref >60)
GLUCOSE: 155 mg/dL — AB (ref 65–99)
Potassium: 4.9 mmol/L (ref 3.5–5.1)
Sodium: 135 mmol/L (ref 135–145)

## 2015-01-06 LAB — CBC
HCT: 34.4 % — ABNORMAL LOW (ref 36.0–46.0)
HEMOGLOBIN: 11.4 g/dL — AB (ref 12.0–15.0)
MCH: 28.6 pg (ref 26.0–34.0)
MCHC: 33.1 g/dL (ref 30.0–36.0)
MCV: 86.2 fL (ref 78.0–100.0)
PLATELETS: 280 10*3/uL (ref 150–400)
RBC: 3.99 MIL/uL (ref 3.87–5.11)
RDW: 13.4 % (ref 11.5–15.5)
WBC: 9.8 10*3/uL (ref 4.0–10.5)

## 2015-01-06 LAB — GLUCOSE, CAPILLARY
Glucose-Capillary: 144 mg/dL — ABNORMAL HIGH (ref 65–99)
Glucose-Capillary: 165 mg/dL — ABNORMAL HIGH (ref 65–99)
Glucose-Capillary: 106 mg/dL — ABNORMAL HIGH (ref 65–99)
Glucose-Capillary: 114 mg/dL — ABNORMAL HIGH (ref 65–99)

## 2015-01-06 LAB — HEMOGLOBIN A1C
Hgb A1c MFr Bld: 5.9 % — ABNORMAL HIGH (ref 4.8–5.6)
Mean Plasma Glucose: 123 mg/dL

## 2015-01-06 MED ORDER — VALSARTAN 160 MG PO TABS
160.0000 mg | ORAL_TABLET | Freq: Every day | ORAL | Status: DC
  Administered 2015-01-07: 160 mg via ORAL
  Filled 2015-01-06: qty 1

## 2015-01-06 MED ORDER — WHITE PETROLATUM GEL
Status: AC
  Administered 2015-01-06: 18:00:00
  Filled 2015-01-06: qty 1

## 2015-01-06 NOTE — Plan of Care (Signed)
Problem: Consults Goal: Diagnosis- Total Joint Replacement Outcome: Adequate for Discharge Primary Total Knee left     

## 2015-01-06 NOTE — Discharge Instructions (Signed)
Information on my medicine - ELIQUIS® (apixaban) ° °This medication education was reviewed with me or my healthcare representative as part of my discharge preparation.  The pharmacist that spoke with me during my hospital stay was:  Shunda Rabadi Donovan, RPH ° °Why was Eliquis® prescribed for you? °Eliquis® was prescribed for you to reduce the risk of blood clots forming after orthopedic surgery.   ° °What do You need to know about Eliquis®? °Take your Eliquis® TWICE DAILY - one tablet in the morning and one tablet in the evening with or without food.  It would be best to take the dose about the same time each day. ° °If you have difficulty swallowing the tablet whole please discuss with your pharmacist how to take the medication safely. ° °Take Eliquis® exactly as prescribed by your doctor and DO NOT stop taking Eliquis® without talking to the doctor who prescribed the medication.  Stopping without other medication to take the place of Eliquis® may increase your risk of developing a clot. ° °After discharge, you should have regular check-up appointments with your healthcare provider that is prescribing your Eliquis®. ° °What do you do if you miss a dose? °If a dose of ELIQUIS® is not taken at the scheduled time, take it as soon as possible on the same day and twice-daily administration should be resumed.  The dose should not be doubled to make up for a missed dose.  Do not take more than one tablet of ELIQUIS at the same time. ° °Important Safety Information °A possible side effect of Eliquis® is bleeding. You should call your healthcare provider right away if you experience any of the following: °? Bleeding from an injury or your nose that does not stop. °? Unusual colored urine (red or dark brown) or unusual colored stools (red or black). °? Unusual bruising for unknown reasons. °? A serious fall or if you hit your head (even if there is no bleeding). ° °Some medicines may interact with Eliquis® and might  increase your risk of bleeding or clotting while on Eliquis®. To help avoid this, consult your healthcare provider or pharmacist prior to using any new prescription or non-prescription medications, including herbals, vitamins, non-steroidal anti-inflammatory drugs (NSAIDs) and supplements. ° °This website has more information on Eliquis® (apixaban): http://www.eliquis.com/eliquis/home °

## 2015-01-06 NOTE — Clinical Social Work Placement (Addendum)
   CLINICAL SOCIAL WORK PLACEMENT  NOTE  Date:  01/06/2015  Patient Details  Name: Brooke Davidson MRN: [id number] Date of Birth: January 03, 1946  Clinical Social Work is seeking post-discharge placement for this patient at the Skilled  Nursing Facility level of care (*CSW will initial, date and re-position this form in  chart as items are completed):  Yes   Patient/family provided with [facility] Clinical Social Work Department's list of facilities offering this level of care within the geographic area requested by the patient (or if unable, by the patient's family).  Yes   Patient/family informed of their freedom to choose among providers that offer the needed level of care, that participate in Medicare, Medicaid or managed care program needed by the patient, have an available bed and are willing to accept the patient.  Yes   Patient/family informed of [facility]'s ownership interest in [facility] and [facility], as well as of the fact that they are under no obligation to receive care at these facilities.  PASRR submitted to EDS on 01/06/15     PASRR number received on 01/06/15     Existing PASRR number confirmed on       FL2 transmitted to all facilities in geographic area requested by pt/family on 01/06/15     FL2 transmitted to all facilities within larger geographic area on       Patient informed that his/her managed care company has contracts with or will negotiate with certain facilities, including the following:            Patient/family informed of bed offers received.  Patient chooses bed at       Physician recommends and patient chooses bed at      Patient to be transferred to   on  .  Patient to be transferred to facility by       Patient family notified on   of transfer.  Name of family member notified:        PHYSICIAN       Additional Comment:    _______________________________________________ Rondel Baton, LCSW 01/06/2015, 12:54 PM

## 2015-01-06 NOTE — Progress Notes (Signed)
Patient declined pain medication.  Patient states that it makes her "too drowsy".  Patient is currently not following advice to ask for assistance when getting out of the bed.  Bed alarm set, but patient continues to get out of bed and walk around the room.  Patient educated on fall risk.  Will continue to monitor patient and make MD/PA aware.

## 2015-01-06 NOTE — Progress Notes (Signed)
Physical Therapy Treatment Patient Details Name: Brooke Davidson MRN: [id number] DOB: 1945-11-24 Today's Date: 01/06/2015    History of Present Illness 69 y.o. s/p Left TKA. Pt with Schizoaffective disorder.    PT Comments    Patient ambulating 75 feet in afternoon session. Patient reporting that she is in a lot more pain this afternoon. Very anxious throughout session with tears throughout ambulation. Repeated cues during session for safety and reorientation to task. Patient without gross loss of balance but repeatedly letting go of walker and attempting to reach down for objects on the floor or in room. Session also included transfers from multiple surfaces and heights. Minimal to min guard assistance for balance and safety concerns.    Follow Up Recommendations  SNF     Equipment Recommendations  None recommended by PT    Recommendations for Other Services       Precautions / Restrictions Precautions Precautions: Knee;Fall Precaution Booklet Issued: Yes (comment) Precaution Comments: HEP Knee Immobilizer - Left: Other (comment) (no knee immobilized used with ambulation) Restrictions Weight Bearing Restrictions: Yes LLE Weight Bearing: Weight bearing as tolerated    Mobility  Bed Mobility Overal bed mobility: Needs Assistance Bed Mobility: Sit to Supine     Supine to sit: Min assist        Transfers Overall transfer level: Needs assistance Equipment used: Rolling walker (2 wheeled) Transfers: Sit to/from Stand Sit to Stand: Min assist         General transfer comment: transfers from chair, raised toilet seat, couch, bed.   Ambulation/Gait Ambulation/Gait assistance: Min guard Ambulation Distance (Feet): 75 Feet Assistive device: Rolling walker (2 wheeled) Gait Pattern/deviations: Step-through pattern;Decreased step length - left;Decreased weight shift to left   Gait velocity interpretation: Below normal speed for age/gender General Gait Details:  frequent and repeated cues needed for safety. Patient repeatedly letting go of walker. Poor safety awareness throughout.   Stairs            Wheelchair Mobility    Modified Rankin (Stroke Patients Only)       Balance Overall balance assessment: Needs assistance Sitting-balance support: No upper extremity supported Sitting balance-Leahy Scale: Fair     Standing balance support: Single extremity supported Standing balance-Leahy Scale: Poor                      Cognition Arousal/Alertness: Awake/alert Behavior During Therapy: Anxious;Impulsive Overall Cognitive Status: Difficult to assess (repeated cues for safety throughtout)                      Exercises Total Joint Exercises Ankle Circles/Pumps: AROM;Both;15 reps Quad Sets: Strengthening;10 reps;Left Towel Squeeze: Both;Strengthening;10 reps Heel Slides: AAROM;Left;10 reps Straight Leg Raises: Strengthening;Left;10 reps Knee Flexion: Left;10 reps;AROM Goniometric ROM: 8-91    General Comments        Pertinent Vitals/Pain Pain Assessment: 0-10 Pain Score: 8  Pain Location: Lt knee Pain Descriptors / Indicators: Aching Pain Intervention(s): Monitored during session;Relaxation    Home Living                      Prior Function            PT Goals (current goals can now be found in the care plan section) Acute Rehab PT Goals Patient Stated Goal: to walk and go back to work PT Goal Formulation: With patient Time For Goal Achievement: 01/19/15 Potential to Achieve Goals: Good Progress towards PT goals: Progressing toward goals  Frequency  7X/week    PT Plan Current plan remains appropriate    Co-evaluation             End of Session Equipment Utilized During Treatment: Gait belt Activity Tolerance: Patient limited by pain;Patient limited by fatigue;Other (comment) (highly anxious during session, tearful with ambulation) Patient left: in bed;in CPM;with call  bell/phone within reach;with bed alarm set;with SCD's reapplied     Time: 1610-9604 PT Time Calculation (min) (ACUTE ONLY): 28 min  Charges:  $Gait Training: 8-22 mins $Therapeutic Exercise: 8-22 mins $Therapeutic Activity: 8-22 mins                    G Codes:      Christiane Ha, PT, CSCS Pager [phone] Office [phone]  01/06/2015, 3:52 PM

## 2015-01-06 NOTE — Evaluation (Signed)
Occupational Therapy Evaluation Patient Details Name: Brooke Davidson MRN: [id number] DOB: 10-Feb-1946 Today's Date: 01/06/2015    History of Present Illness 69 y.o. s/p Left TKA. Pt with Schizoaffective disorder.   Clinical Impression   Pt s/p above. Pt independent with ADLs, PTA. Feel pt will benefit from acute OT to increase independence prior to d/c.  Pt planning to d/c to SNF for rehab.     Follow Up Recommendations  SNF    Equipment Recommendations  Other (comment) (defer to next venue)    Recommendations for Other Services       Precautions / Restrictions Precautions Precautions: Knee;Fall Required Braces or Orthoses: Knee Immobilizer - Left Restrictions Weight Bearing Restrictions: Yes LLE Weight Bearing: Weight bearing as tolerated      Mobility Bed Mobility               General bed mobility comments: not assessed  Transfers Overall transfer level: Needs assistance   Transfers: Sit to/from Stand Sit to Stand: Min guard         General transfer comment: cues for technique/hand placement.    Balance  Pt using RW for ambulation. No LOB in session.                                           ADL Overall ADL's : Needs assistance/impaired Eating/Feeding: Sitting;Independent   Grooming: Set up;Sitting   Upper Body Bathing: Sitting;Set up;Supervision/ safety   Lower Body Bathing: Sit to/from stand;Min guard   Upper Body Dressing : Set up;Sitting;Supervision/safety   Lower Body Dressing: Sit to/from stand;Min guard   Toilet Transfer: Supervision/safety;Ambulation (chair)   Toileting- Clothing Manipulation and Hygiene: Set up;Supervision/safety;Sit to/from stand       Functional mobility during ADLs: Min guard;Rolling walker General ADL Comments: Educated on LB dressing technique. Pt able to reach and don/doff both socks.      Vision     Perception     Praxis      Pertinent Vitals/Pain Pain Assessment:  0-10 Pain Score: 9  Pain Location: left knee Pain Intervention(s): Monitored during session;Repositioned     Hand Dominance     Extremity/Trunk Assessment Upper Extremity Assessment Upper Extremity Assessment: Overall WFL for tasks assessed   Lower Extremity Assessment Lower Extremity Assessment: Defer to PT evaluation       Communication Communication Communication: No difficulties   Cognition Arousal/Alertness: Awake/alert Behavior During Therapy: Anxious Overall Cognitive Status: No family/caregiver present to determine baseline cognitive functioning (cues to keep pt on task; very talkative; decreased attention; decreased safety awareness)       Memory: Decreased short-term memory (reports decreased memory)             General Comments       Exercises  Pt appeared to use arm to help lift left leg first time when asked to perform straight leg raise, but then able to raise it without using UE second time.     Shoulder Instructions      Home Living Family/patient expects to be discharged to:: Skilled nursing facility Living Arrangements: Alone                               Additional Comments: patient reports living alone but planning to go for further rehab after the hospital      Prior Functioning/Environment  Level of Independence: Independent             OT Diagnosis: Acute pain   OT Problem List: Decreased cognition;Decreased safety awareness;Decreased knowledge of use of DME or AE;Decreased knowledge of precautions;Pain;Decreased strength   OT Treatment/Interventions: Self-care/ADL training;DME and/or AE instruction;Therapeutic activities;Patient/family education;Balance training;Cognitive remediation/compensation    OT Goals(Current goals can be found in the care plan section) Acute Rehab OT Goals Patient Stated Goal: to walk and go back to work OT Goal Formulation: With patient Time For Goal Achievement: 01/13/15 Potential to  Achieve Goals: Good ADL Goals Pt Will Perform Lower Body Dressing: sit to/from stand;with set-up Pt Will Transfer to Toilet: with supervision;ambulating  OT Frequency: Min 2X/week   Barriers to D/C:            Co-evaluation              End of Session Equipment Utilized During Treatment: Gait belt;Rolling walker;Left knee immobilizer CPM Left Knee CPM Left Knee: Off Nurse Communication: Other (comment) (talked to tech/nurse about walking pt)  Activity Tolerance: Patient tolerated treatment well Patient left: in chair;with call bell/phone within reach;with chair alarm set   Time: [phone] OT Time Calculation (min): 19 min Charges:  OT General Charges $OT Visit: 1 Procedure OT Evaluation $Initial OT Evaluation Tier I: 1 Procedure G-CodesEarlie Raveling OTR/L [phone] 01/06/2015, 9:47 AM

## 2015-01-06 NOTE — Clinical Social Work Note (Addendum)
Clinical Social Work Assessment  Patient Details  Name: Brooke Davidson MRN: [id number] Date of Birth: 12/12/1945  Date of referral:  01/06/15               Reason for consult:  Facility Placement                Permission sought to share information with:  Oceanographer granted to share information::  Yes, Verbal Permission Granted  Name::        Agency::   [facility])  Relationship::     Contact Information:     Housing/Transportation Living arrangements for the past 2 months:  Single Family Home Source of Information:  Patient Patient Interpreter Needed:  None Criminal Activity/Legal Involvement Pertinent to Current Situation/Hospitalization:  No - Comment as needed Significant Relationships:  Siblings Lives with:  Self Do you feel safe going back to the place where you live?  No (fall risk) Need for family participation in patient care:  No (Coment)  Care giving concerns:  No caregiver at bedside.   Social Worker assessment / plan:  Patient exhibits anxiety regarding placement needs.  Patient does not feel comfortable being discharged home without supervision.  Family is in [place].  No local supports.  Insurance authorization required by [employer].  PT to document ambulations.  Employment status:  Retired Health and safety inspector:    PT Recommendations:  Skilled Teacher, early years/pre / Referral to community resources:  Skilled Nursing Facility  Patient/Family's Response to care:  Patient agreeable to SNF  Patient/Family's Understanding of and Emotional Response to Diagnosis, Current Treatment, and Prognosis:  Patient has clear understanding of prognosis and needed STR at time of discharge  Emotional Assessment Appearance:  Appears younger than stated age Attitude/Demeanor/Rapport:   (appropriate) Affect (typically observed):  Adaptable, Restless, Anxious Orientation:  Oriented to Self, Oriented to Place, Oriented to  Time,  Oriented to Situation, Fluctuating Orientation (Suspected and/or reported Sundowners) Alcohol / Substance use:    Psych involvement (Current and /or in the community):  No (Comment)  Discharge Needs  Concerns to be addressed:  Financial / Insurance Concerns Readmission within the last 30 days:    Current discharge risk:  None Barriers to Discharge:  No Barriers Identified   Rondel Baton, LCSW 01/06/2015, 12:51 PM

## 2015-01-06 NOTE — Progress Notes (Signed)
Physical Therapy Treatment Patient Details Name: Brooke Davidson MRN: [id number] DOB: 09/30/1945 Today's Date: 01/06/2015    History of Present Illness 69 y.o. s/p Left TKA. Pt with Schizoaffective disorder.    PT Comments    Pt is s/p TKA resulting in the deficits listed below (see PT Problem List).  Pt will benefit from skilled PT to increase their independence and safety with mobility to allow discharge to SNF.    Follow Up Recommendations  SNF     Equipment Recommendations  None recommended by PT    Recommendations for Other Services       Precautions / Restrictions Precautions Precautions: Knee;Fall Precaution Booklet Issued: Yes (comment) Precaution Comments: HEP Required Braces or Orthoses: Knee Immobilizer - Left Knee Immobilizer - Left: Other (comment) (amb. with PA without immobilizer) Restrictions Weight Bearing Restrictions: Yes LLE Weight Bearing: Weight bearing as tolerated    Mobility  Bed Mobility               General bed mobility comments: not assessed  Transfers            Wheelchair Mobility    Modified Rankin (Stroke Patients Only)       Balance                                    Cognition Arousal/Alertness: Awake/alert Behavior During Therapy: WFL for tasks assessed/performed Overall Cognitive Status: Within Functional Limits for tasks assessed       Memory: Decreased short-term memory (reports decreased memory)              Exercises Total Joint Exercises Ankle Circles/Pumps: AROM;Both;15 reps Quad Sets: Strengthening;10 reps;Left Towel Squeeze: Both;Strengthening;10 reps Heel Slides: AAROM;Left;10 reps Straight Leg Raises: Strengthening;Left;10 reps Knee Flexion: Left;10 reps;AROM Goniometric ROM: 8-91    General Comments        Pertinent Vitals/Pain Pain Assessment: 0-10 Pain Score: 5  Pain Location: Lt knee Pain Descriptors / Indicators: Aching Pain Intervention(s): Monitored  during session;Limited activity within patient's tolerance    Home Living Family/patient expects to be discharged to:: Skilled nursing facility Living Arrangements: Alone             Additional Comments: patient reports living alone but planning to go for further rehab after the hospital    Prior Function Level of Independence: Independent          PT Goals (current goals can now be found in the care plan section) Acute Rehab PT Goals Patient Stated Goal: to walk and go back to work PT Goal Formulation: With patient Time For Goal Achievement: 01/19/15 Potential to Achieve Goals: Good Progress towards PT goals: Progressing toward goals    Frequency  7X/week    PT Plan Current plan remains appropriate    Co-evaluation             End of Session   Activity Tolerance: Patient tolerated treatment well Patient left: in chair;with call bell/phone within reach;with bed alarm set;with chair alarm set;with family/visitor present     Time: 4098-1191 PT Time Calculation (min) (ACUTE ONLY): 20 min  Charges:  $Therapeutic Exercise: 8-22 mins                    G Codes:      Christiane Ha, PT, CSCS Pager [phone] Office [phone]  01/06/2015, 12:28 PM

## 2015-01-06 NOTE — Progress Notes (Signed)
Subjective: 1 Day Post-Op Procedure(s) (LRB): TOTAL KNEE ARTHROPLASTY (Left) Patient reports pain as 2 on 0-10 scale.   Patient is very impulsive and repeatedly gets up unsupervised.  Staff is very vigilant to watch her progress.  Objective: Vital signs in last 24 hours: Temp:  [97.2 F (36.2 C)-98.5 F (36.9 C)] 97.8 F (36.6 C) (07/26 0558) Pulse Rate:  [55-74] 63 (07/26 0558) Resp:  [8-20] 18 (07/26 0558) BP: (116-184)/(59-98) 141/60 mmHg (07/26 0558) SpO2:  [89 %-100 %] 100 % (07/26 0558) Weight:  [69.99 kg (154 lb 4.8 oz)] 69.99 kg (154 lb 4.8 oz) (07/25 0840)  Intake/Output from previous day: 07/25 0701 - 07/26 0700 In: 1700 [I.V.:1700] Out: 1400 [Urine:1250; Drains:150] Intake/Output this shift:     Recent Labs  01/06/15 0559  HGB 11.4*    Recent Labs  01/06/15 0559  WBC 9.8  RBC 3.99  HCT 34.4*  PLT 280    Recent Labs  01/06/15 0559  NA 135  K 4.9  CL 104  CO2 22  BUN 17  CREATININE 1.03*  GLUCOSE 155*  CALCIUM 8.7*   No results for input(s): LABPT, INR in the last 72 hours.  ABD soft Neurovascular intact Sensation intact distally Intact pulses distally Incision: dressing C/D/I She ambulated with supervision and multiple safety ques Assessment/Plan: 1 Day Post-Op Procedure(s) (LRB): TOTAL KNEE ARTHROPLASTY (Left)  Principal Problem:   Primary localized osteoarthritis of left knee Active Problems:   Osteoarthritis   Schizoaffective disorder   Diabetes mellitus without complication   Hypertension   HLD (hyperlipidemia)   Asthma   Pulmonary nodule   Glaucoma   Insomnia   DJD (degenerative joint disease) of knee  Advance diet Up with therapy D/C IV fluids  Naydeen Speirs J 01/06/2015, 8:38 AM

## 2015-01-07 DIAGNOSIS — Z96652 Presence of left artificial knee joint: Secondary | ICD-10-CM | POA: Diagnosis not present

## 2015-01-07 DIAGNOSIS — M1990 Unspecified osteoarthritis, unspecified site: Secondary | ICD-10-CM | POA: Diagnosis not present

## 2015-01-07 LAB — CBC
HEMATOCRIT: 26.2 % — AB (ref 36.0–46.0)
Hemoglobin: 8.9 g/dL — ABNORMAL LOW (ref 12.0–15.0)
MCH: 28.6 pg (ref 26.0–34.0)
MCHC: 34 g/dL (ref 30.0–36.0)
MCV: 84.2 fL (ref 78.0–100.0)
Platelets: 251 10*3/uL (ref 150–400)
RBC: 3.11 MIL/uL — AB (ref 3.87–5.11)
RDW: 13.6 % (ref 11.5–15.5)
WBC: 10.6 10*3/uL — ABNORMAL HIGH (ref 4.0–10.5)

## 2015-01-07 LAB — GLUCOSE, CAPILLARY
Glucose-Capillary: 185 mg/dL — ABNORMAL HIGH (ref 65–99)
Glucose-Capillary: 109 mg/dL — ABNORMAL HIGH (ref 65–99)

## 2015-01-07 LAB — BASIC METABOLIC PANEL
ANION GAP: 5 (ref 5–15)
BUN: 19 mg/dL (ref 6–20)
CHLORIDE: 110 mmol/L (ref 101–111)
CO2: 22 mmol/L (ref 22–32)
CREATININE: 0.9 mg/dL (ref 0.44–1.00)
Calcium: 8.4 mg/dL — ABNORMAL LOW (ref 8.9–10.3)
GFR calc Af Amer: >60 mL/min (ref >60)
GFR calc non Af Amer: >60 mL/min (ref >60)
GLUCOSE: 195 mg/dL — AB (ref 65–99)
Potassium: 5 mmol/L (ref 3.5–5.1)
SODIUM: 137 mmol/L (ref 135–145)

## 2015-01-07 MED ORDER — POLYETHYLENE GLYCOL 3350 17 G PO PACK
PACK | ORAL | Status: DC
Start: 2015-01-07 — End: 2015-08-26

## 2015-01-07 MED ORDER — APIXABAN 2.5 MG PO TABS: 2.5000 mg | ORAL_TABLET | Freq: Two times a day (BID) | ORAL | Status: DC

## 2015-01-07 MED ORDER — DOCUSATE SODIUM 100 MG PO CAPS: 100.0000 mg | ORAL_CAPSULE | Freq: Two times a day (BID) | ORAL | Status: DC

## 2015-01-07 MED ORDER — HYDROMORPHONE HCL 2 MG PO TABS: 2.0000 mg | ORAL_TABLET | ORAL | Status: DC | PRN

## 2015-01-07 MED ORDER — LORAZEPAM 0.5 MG PO TABS: 0.5000 mg | ORAL_TABLET | Freq: Four times a day (QID) | ORAL | Status: DC | PRN

## 2015-01-07 NOTE — Discharge Summary (Signed)
Patient ID: Brooke Davidson MRN: [id number] DOB/AGE: 1946-02-25 69 y.o.  Admit date: 01/05/2015 Discharge date: 01/07/2015  Admission Diagnoses:  Principal Problem:   Primary localized osteoarthritis of left knee Active Problems:   Osteoarthritis   Schizoaffective disorder   Diabetes mellitus without complication   Hypertension   HLD (hyperlipidemia)   Asthma   Pulmonary nodule   Glaucoma   Insomnia   DJD (degenerative joint disease) of knee   Discharge Diagnoses:  Same  Past Medical History  Diagnosis Date  . Insomnia     takes Ambien nightly   . Glaucoma     uses eye drops daily  . Pulmonary nodule   . HLD (hyperlipidemia)     takes Pravastatin daily  . Osteoarthritis     knees  . Primary localized osteoarthritis of left knee   . Diabetes mellitus without complication     takes Metformin daily  . GERD (gastroesophageal reflux disease)     takes Omeprazole daily  . Asthma     uses Symbicort daily and ALbuterol as needed  . Muscle spasm     takes Baclofen daily as needed  . Hypertension     takes Diovan daily  . Complication of anesthesia     slow to wake up  . Cough     nonproductive and without fever  . History of bronchitis a yr ago  . Joint pain   . Joint swelling   . Chronic back pain   . Eczema   . Urinary frequency   . Urinary urgency   . Depression     but doesn't take any meds   . Schizoaffective disorder     Surgeries: Procedure(s): TOTAL KNEE ARTHROPLASTY on 01/05/2015   Consultants:    Discharged Condition: Improved  Hospital Course: Brooke Davidson is an 69 y.o. female who was admitted 01/05/2015 for operative treatment ofPrimary localized osteoarthritis of left knee. Patient has severe unremitting pain that affects sleep, daily activities, and work/hobbies. After pre-op clearance the patient was taken to the operating room on 01/05/2015 and underwent  Procedure(s): TOTAL KNEE ARTHROPLASTY.    Patient was given perioperative  antibiotics: Anti-infectives    Start     Dose/Rate Route Frequency Ordered Stop   01/05/15 1700  ceFAZolin (ANCEF) IVPB 2 g/50 mL premix     2 g 100 mL/hr over 30 Minutes Intravenous Every 6 hours 01/05/15 1420 01/06/15 0011   01/05/15 1139  cefUROXime (ZINACEF) injection  Status:  Discontinued       As needed 01/05/15 1139 01/05/15 1300   01/05/15 0736  ceFAZolin (ANCEF) IVPB 2 g/50 mL premix     2 g 100 mL/hr over 30 Minutes Intravenous On call to O.R. 01/05/15 0736 01/05/15 1055       Patient was given sequential compression devices, early ambulation, and chemoprophylaxis to prevent DVT.  Patient benefited maximally from hospital stay and there were no complications.    Recent vital signs: Patient Vitals for the past 24 hrs:  BP Temp Temp src Pulse Resp SpO2  01/07/15 0549 118/65 mmHg 97.7 F (36.5 C) Oral 74 16 100 %  01/06/15 2058 (!) 142/66 mmHg 99 F (37.2 C) Oral 66 17 97 %  01/06/15 2000 - - - - - 99 %  01/06/15 1400 131/70 mmHg 98.4 F (36.9 C) - 75 16 100 %     Recent laboratory studies:  Recent Labs  01/06/15 0559 01/07/15 0423  WBC 9.8 10.6*  HGB 11.4* 8.9*  HCT 34.4* 26.2*  PLT 280 251  NA 135 137  K 4.9 5.0  CL 104 110  CO2 22 22  BUN 17 19  CREATININE 1.03* 0.90  GLUCOSE 155* 195*  CALCIUM 8.7* 8.4*     Discharge Medications:     Medication List    STOP taking these medications        ASPIRIN LOW DOSE 81 MG EC tablet  Generic drug:  aspirin     clonazePAM 0.5 MG tablet  Commonly known as:  KLONOPIN     desoximetasone 0.05 % cream  Commonly known as:  TOPICORT     diphenhydramine-acetaminophen 25-500 MG Tabs  Commonly known as:  TYLENOL PM     FISH OIL PO     naproxen sodium 220 MG tablet  Commonly known as:  ANAPROX     vitamin E 100 UNIT capsule      TAKE these medications        albuterol 108 (90 BASE) MCG/ACT inhaler  Commonly known as:  PROVENTIL HFA;VENTOLIN HFA  Inhale into the lungs every 6 (six) hours as needed for  wheezing or shortness of breath.     apixaban 2.5 MG Tabs tablet  Commonly known as:  ELIQUIS  Take 1 tablet (2.5 mg total) by mouth every 12 (twelve) hours.     baclofen 20 MG tablet  Commonly known as:  LIORESAL  Take 20 mg by mouth daily as needed for muscle spasms.     budesonide-formoterol 80-4.5 MCG/ACT inhaler  Commonly known as:  SYMBICORT  Inhale 2 puffs into the lungs 2 (two) times daily.     docusate sodium 100 MG capsule  Commonly known as:  COLACE  Take 1 capsule (100 mg total) by mouth 2 (two) times daily.     latanoprost 0.005 % ophthalmic solution  Commonly known as:  XALATAN  Place 1 drop into both eyes at bedtime.     LORazepam 0.5 MG tablet  Commonly known as:  ATIVAN  Take 1 tablet (0.5 mg total) by mouth every 6 (six) hours as needed for anxiety.     metFORMIN 500 MG tablet  Commonly known as:  GLUCOPHAGE  Take 500 mg by mouth 2 (two) times daily with a meal.     omeprazole 40 MG capsule  Commonly known as:  PRILOSEC  Take 40 mg by mouth daily as needed (heartburn).     polyethylene glycol packet  Commonly known as:  MIRALAX / GLYCOLAX  17grams in 16 oz of water twice a day until bowel movement.  LAXITIVE.  Restart if two days since last bowel movement     pravastatin 80 MG tablet  Commonly known as:  PRAVACHOL  Take 80 mg by mouth at bedtime.     valsartan 160 MG tablet  Commonly known as:  DIOVAN  Take 1 tablet (160 mg total) by mouth daily.     zolpidem 10 MG tablet  Commonly known as:  AMBIEN  Take 10 mg by mouth at bedtime.        Diagnostic Studies: Dg Knee 1-2 Views Left  01-14-15   CLINICAL DATA:  Status post left knee arthroplasty.  EXAM: LEFT KNEE - 1-2 VIEW  COMPARISON:  None.  FINDINGS: The femoral and tibial components appear to be well situated. No fracture or dislocation is noted. Surgical drain is seen in the anterior joint space.  IMPRESSION: Status post left total knee arthroplasty.   Electronically Signed   By: Fayrene Fearing  Christen Butter, M.D.   On: 01/05/2015 13:30    Disposition: 01-Home or Self Care      Discharge Instructions    CPM    Complete by:  As directed   Continuous passive motion machine (CPM):      Use the CPM from 0 to 90 for 6 hours per day.       You may break it up into 2 or 3 sessions per day.      Use CPM for 2 weeks or until you are told to stop.     Call MD / Call 911    Complete by:  As directed   If you experience chest pain or shortness of breath, CALL 911 and be transported to the hospital emergency room.  If you develope a fever above 101 F, pus (white drainage) or increased drainage or redness at the wound, or calf pain, call your surgeon's office.     Change dressing    Complete by:  As directed   Change the gauze dressing daily with sterile 4 x 4 inch gauze and apply TED hose.  DO NOT REMOVE BANDAGE OVER SURGICAL INCISION.  WASH WHOLE LEG INCLUDING OVER THE WATERPROOF BANDAGE WITH SOAP AND WATER EVERY DAY.     Constipation Prevention    Complete by:  As directed   Drink plenty of fluids.  Prune juice may be helpful.  You may use a stool softener, such as Colace (over the counter) 100 mg twice a day.  Use MiraLax (over the counter) for constipation as needed.     Diet - low sodium heart healthy    Complete by:  As directed      Discharge instructions    Complete by:  As directed   INSTRUCTIONS AFTER JOINT REPLACEMENT   Remove items at home which could result in a fall. This includes throw rugs or furniture in walking pathways ICE to the affected joint every three hours while awake for 30 minutes at a time, for at least the first 3-5 days, and then as needed for pain and swelling.  Continue to use ice for pain and swelling. You may notice swelling that will progress down to the foot and ankle.  This is normal after surgery.  Elevate your leg when you are not up walking on it.   Continue to use the breathing machine you got in the hospital (incentive spirometer) which will help keep  your temperature down.  It is common for your temperature to cycle up and down following surgery, especially at night when you are not up moving around and exerting yourself.  The breathing machine keeps your lungs expanded and your temperature down.   DIET:  As you were doing prior to hospitalization, we recommend a well-balanced diet.  DRESSING / WOUND CARE / SHOWERING  Keep the surgical dressing until follow up.  The dressing is water proof, so you can shower without any extra covering.  IF THE DRESSING FALLS OFF or the wound gets wet inside, change the dressing with sterile gauze.  Please use good hand washing techniques before changing the dressing.  Do not use any lotions or creams on the incision until instructed by your surgeon.    ACTIVITY  Increase activity slowly as tolerated, but follow the weight bearing instructions below.   No driving for 6 weeks or until further direction given by your physician.  You cannot drive while taking narcotics.  No lifting or carrying greater than 10 lbs. until  further directed by your surgeon. Avoid periods of inactivity such as sitting longer than an hour when not asleep. This helps prevent blood clots.  You may return to work once you are authorized by your doctor.     WEIGHT BEARING   Weight bearing as tolerated with assist device (walker, cane, etc) as directed, use it as long as suggested by your surgeon or therapist, typically at least 4-6 weeks.   EXERCISES  Results after joint replacement surgery are often greatly improved when you follow the exercise, range of motion and muscle strengthening exercises prescribed by your doctor. Safety measures are also important to protect the joint from further injury. Any time any of these exercises cause you to have increased pain or swelling, decrease what you are doing until you are comfortable again and then slowly increase them. If you have problems or questions, call your caregiver or physical  therapist for advice.   Rehabilitation is important following a joint replacement. After just a few days of immobilization, the muscles of the leg can become weakened and shrink (atrophy).  These exercises are designed to build up the tone and strength of the thigh and leg muscles and to improve motion. Often times heat used for twenty to thirty minutes before working out will loosen up your tissues and help with improving the range of motion but do not use heat for the first two weeks following surgery (sometimes heat can increase post-operative swelling).   These exercises can be done on a training (exercise) mat, on the floor, on a table or on a bed. Use whatever works the best and is most comfortable for you.    Use music or television while you are exercising so that the exercises are a pleasant break in your day. This will make your life better with the exercises acting as a break in your routine that you can look forward to.   Perform all exercises about fifteen times, three times per day or as directed.  You should exercise both the operative leg and the other leg as well.   Exercises include:   Quad Sets - Tighten up the muscle on the front of the thigh (Quad) and hold for 5-10 seconds.   Straight Leg Raises - With your knee straight (if you were given a brace, keep it on), lift the leg to 60 degrees, hold for 3 seconds, and slowly lower the leg.  Perform this exercise against resistance later as your leg gets stronger.  Leg Slides: Lying on your back, slowly slide your foot toward your buttocks, bending your knee up off the floor (only go as far as is comfortable). Then slowly slide your foot back down until your leg is flat on the floor again.  Angel Wings: Lying on your back spread your legs to the side as far apart as you can without causing discomfort.  Hamstring Strength:  Lying on your back, push your heel against the floor with your leg straight by tightening up the muscles of your  buttocks.  Repeat, but this time bend your knee to a comfortable angle, and push your heel against the floor.  You may put a pillow under the heel to make it more comfortable if necessary.   A rehabilitation program following joint replacement surgery can speed recovery and prevent re-injury in the future due to weakened muscles. Contact your doctor or a physical therapist for more information on knee rehabilitation.    CONSTIPATION  Constipation is defined medically as  fewer than three stools per week and severe constipation as less than one stool per week.  Even if you have a regular bowel pattern at home, your normal regimen is likely to be disrupted due to multiple reasons following surgery.  Combination of anesthesia, postoperative narcotics, change in appetite and fluid intake all can affect your bowels.   YOU MUST use at least one of the following options; they are listed in order of increasing strength to get the job done.  They are all available over the counter, and you may need to use some, POSSIBLY even all of these options:    Drink plenty of fluids (prune juice may be helpful) and high fiber foods Colace 100 mg by mouth twice a day  Senokot for constipation as directed and as needed Dulcolax (bisacodyl), take with full glass of water  Miralax (polyethylene glycol) once or twice a day as needed.  If you have tried all these things and are unable to have a bowel movement in the first 3-4 days after surgery call either your surgeon or your primary doctor.    If you experience loose stools or diarrhea, hold the medications until you stool forms back up.  If your symptoms do not get better within 1 week or if they get worse, check with your doctor.  If you experience "the worst abdominal pain ever" or develop nausea or vomiting, please contact the office immediately for further recommendations for treatment.   ITCHING:  If you experience itching with your medications, try taking only a  single pain pill, or even half a pain pill at a time.  You can also use Benadryl over the counter for itching or also to help with sleep.   TED HOSE STOCKINGS:  Use stockings on both legs until for at least 2 weeks or as directed by physician office. They may be removed at night for sleeping.  MEDICATIONS:  See your medication summary on the "After Visit Summary" that nursing will review with you.  You may have some home medications which will be placed on hold until you complete the course of blood thinner medication.  It is important for you to complete the blood thinner medication as prescribed.  PRECAUTIONS:  If you experience chest pain or shortness of breath - call 911 immediately for transfer to the hospital emergency department.   If you develop a fever greater that 101 F, purulent drainage from wound, increased redness or drainage from wound, foul odor from the wound/dressing, or calf pain - CONTACT YOUR SURGEON.                                                   FOLLOW-UP APPOINTMENTS:  If you do not already have a post-op appointment, please call the office for an appointment to be seen by your surgeon.  Guidelines for how soon to be seen are listed in your "After Visit Summary", but are typically between 1-4 weeks after surgery.  OTHER INSTRUCTIONS:   Knee Replacement:  Do not place pillow under knee, focus on keeping the knee straight while resting. CPM instructions: 0-90 degrees, 2 hours in the morning, 2 hours in the afternoon, and 2 hours in the evening. Place foam block, curve side up under heel at all times except when in CPM or when walking.  DO NOT modify, tear, cut,  or change the foam block in any way.  MAKE SURE YOU:  Understand these instructions.  Get help right away if you are not doing well or get worse.    Thank you for letting us be a part of your medical care team.  It is a privilege we respect greatly.  We hope these instructions will help you stay on track for a  fast and full recovery!     Do not put a pillow under the knee. Place it under the heel.    Complete by:  As directed   Place gray foam block, curve side up under heel at all times except when in CPM or when walking.  DO NOT modify, tear, cut, or change in any way the gray foam block.     Increase activity slowly as tolerated    Complete by:  As directed      TED hose    Complete by:  As directed   Use stockings (TED hose) for 2 weeks on both leg(s).  You may remove them at night for sleeping.           Follow-up Information    Follow up with Nilda Simmer, MD On 01/20/2015.   Specialty:  Orthopedic Surgery   Why:  appt time 11 am   Contact information:   [address] [place] [address] [phone]        Signed: Pascal Lux 01/07/2015, 10:21 AM

## 2015-01-07 NOTE — Care Management Important Message (Signed)
Important Message  Patient Details  Name: Brooke Davidson MRN: [id number] Date of Birth: 08-May-1946   Medicare Important Message Given:  Yes-second notification given    Orson Aloe 01/07/2015, 12:32 PM

## 2015-01-07 NOTE — Clinical Social Work Note (Addendum)
Patient will discharge today per MD order. Patient will discharge to [facility] RN to call report prior to transportation to: [phone] Transportation: [facility]- scheduled   CSW sent discharge summary to SNF for review.  Packet is complete.  RN, patient and family aware of discharge plans.  Gina , LCSWA [phone]  Psychiatric & Orthopedics (5N 1-16) Clinical Social Worker     12:12pm- CSW received insurance authorization from [employer] [id number]  10:10am- CSW met with patient at bedside to review bed offers.  Patient chose [facility].  CSW contacted liaison at [facility] to confirm bed availability.  CSW contacted [employer] for insurance authorization for SNF placement.  Insurance authorization pending at this time.  Patient is aware of need for insurance approval. Of note, PT documentation reads patient ambulating 75 feet min guard during PT session on 01/06/2015.  CSW reviewed the possibility of an insurance denial with the patient due to her progress.  Patient states she has no one at home to assist her at time of discharge.  RNCM and PA are aware of possible insurance denial.  CSW will update patient once insurance has had an opportunity to review clinicals.    Gina , LCSW [phone]  Psychiatric & Orthopedics (5N 1-8) Clinical Social Worker   

## 2015-01-07 NOTE — Clinical Social Work Placement (Signed)
   CLINICAL SOCIAL WORK PLACEMENT  NOTE  Date:  01/07/2015  Patient Details  Name: Brooke Davidson MRN: [id number] Date of Birth: 03-24-1946  Clinical Social Work is seeking post-discharge placement for this patient at the Skilled  Nursing Facility level of care (*CSW will initial, date and re-position this form in  chart as items are completed):  Yes   Patient/family provided with [facility] Clinical Social Work Department's list of facilities offering this level of care within the geographic area requested by the patient (or if unable, by the patient's family).  Yes   Patient/family informed of their freedom to choose among providers that offer the needed level of care, that participate in Medicare, Medicaid or managed care program needed by the patient, have an available bed and are willing to accept the patient.  Yes   Patient/family informed of [facility]'s ownership interest in [facility] and [facility], as well as of the fact that they are under no obligation to receive care at these facilities.  PASRR submitted to EDS on 01/06/15     PASRR number received on 01/06/15     Existing PASRR number confirmed on       FL2 transmitted to all facilities in geographic area requested by pt/family on 01/06/15     FL2 transmitted to all facilities within larger geographic area on       Patient informed that his/her managed care company has contracts with or will negotiate with certain facilities, including the following:        Yes   Patient/family informed of bed offers received.  Patient chooses bed at [facility]     Physician recommends and patient chooses bed at  (none)    Patient to be transferred to [facility] on 01/07/15.  Patient to be transferred to facility by [facility]     Patient family notified on 01/07/15 of transfer.  Name of family member notified:  patient is alert and oriented states family is out of state and she will  update as appropriate     PHYSICIAN Please prepare priority discharge summary, including medications     Additional Comment:    _______________________________________________ Rondel Baton, LCSW 01/07/2015, 10:10 AM

## 2015-01-07 NOTE — Care Management Note (Signed)
Case Management Note  Patient Details  Name: TIAJUANA LEPPANEN MRN: [id number] Date of Birth: [birth year]  Subjective/Objective:    69 yr old female s/p left total knee arthroplasty.               Action/Plan: Case manager spoke with patient concerning discharge plan. Patient states she will go to SNF for shortterm rehab. Social worker is aware.    Expected Discharge Date:   7/287/16               Expected Discharge Plan:   Skilled Nursing Facility  In-House Referral:  Clinical Social Work  Discharge planning Services  CM Consult  Post Acute Care Choice:    Choice offered to:  Patient  DME Arranged:    DME Agency:     HH Arranged:    HH Agency:  [facility]  Status of Service:  Completed, signed off  Medicare Important Message Given:    Date Medicare IM Given:    Medicare IM give by:    Date Additional Medicare IM Given:    Additional Medicare Important Message give by:     If discussed at Long Length of Stay Meetings, dates discussed:    Additional Comments:  Durenda Guthrie, RN 01/07/2015, 12:03 PM

## 2015-01-07 NOTE — Progress Notes (Signed)
Physical Therapy Treatment Patient Details Name: Brooke Davidson MRN: [id number] DOB: [birth year] Today's Date: 01/07/2015    History of Present Illness 69 y.o. s/p Left TKA. Pt with Schizoaffective disorder.    PT Comments    Patient's mobility has been variable during sessions. Uncertain in patient will be able to D/C home or to SNF. If D/C home will recommend Home Health PT services. Patient does live alone and has demonstrated inconsistent safety awareness during therapy sessions.    Follow Up Recommendations  SNF;Home health PT (Variable mobility- SNF vs Home health PT)     Equipment Recommendations  None recommended by PT    Recommendations for Other Services       Precautions / Restrictions Precautions Precautions: Knee;Fall Knee Immobilizer - Left:  (not using immobilizer for ambulation ) Restrictions Weight Bearing Restrictions: Yes LLE Weight Bearing: Weight bearing as tolerated    Mobility  Bed Mobility                  Transfers Overall transfer level: Needs assistance Equipment used: Rolling walker (2 wheeled) Transfers: Sit to/from Stand Sit to Stand: Modified independent (Device/Increase time)         General transfer comment: transfers from chair, raised toilet seat  Ambulation/Gait Ambulation/Gait assistance: Supervision Ambulation Distance (Feet): 100 Feet Assistive device: Rolling walker (2 wheeled) Gait Pattern/deviations: Step-through pattern;Decreased weight shift to left (encouraging knee flexion and posture)   Gait velocity interpretation: Below normal speed for age/gender General Gait Details: Patient stating she just can't go any further right now.    Stairs            Wheelchair Mobility    Modified Rankin (Stroke Patients Only)       Balance Overall balance assessment: Needs assistance Sitting-balance support: No upper extremity supported Sitting balance-Leahy Scale: Fair     Standing balance support: No  upper extremity supported Standing balance-Leahy Scale: Fair                      Cognition Arousal/Alertness: Awake/alert Behavior During Therapy: Anxious;Impulsive Overall Cognitive Status: Difficult to assess (Intermittant confusion with needed reorientation to task)                      Exercises Total Joint Exercises Ankle Circles/Pumps: AROM;Both;15 reps Quad Sets: Strengthening;10 reps;Left Heel Slides: AAROM;Left;10 reps Straight Leg Raises: Strengthening;Left;10 reps Goniometric ROM: 75 degrees sitting    General Comments        Pertinent Vitals/Pain Pain Assessment: 0-10 Pain Score: 8  Pain Location: Lt knee Pain Descriptors / Indicators: Aching Pain Intervention(s): Monitored during session    Home Living                      Prior Function            PT Goals (current goals can now be found in the care plan section) Acute Rehab PT Goals Patient Stated Goal: to walk and go back to work PT Goal Formulation: With patient Time For Goal Achievement: 01/19/15 Potential to Achieve Goals: Good Progress towards PT goals: Progressing toward goals    Frequency  7X/week    PT Plan Current plan remains appropriate    Co-evaluation             End of Session Equipment Utilized During Treatment: Gait belt Activity Tolerance: Patient limited by fatigue;Patient limited by pain (Intermittant reports of pain and fatigue during session.) Patient left:  in chair;with call bell/phone within reach     Time: 1039-1111 PT Time Calculation (min) (ACUTE ONLY): 32 min  Charges:  $Gait Training: 8-22 mins $Therapeutic Exercise: 8-22 mins                    G Codes:      Christiane Ha, PT, CSCS Pager [phone] Office [phone]  01/07/2015, 12:08 PM

## 2015-01-08 ENCOUNTER — Ambulatory Visit: Payer: Commercial Managed Care - HMO

## 2015-01-13 ENCOUNTER — Other Ambulatory Visit: Payer: Self-pay | Admitting: Licensed Clinical Social Worker

## 2015-01-13 NOTE — Patient Outreach (Signed)
[facility]) Care Management  01/13/2015  VERTA RIEDLINGER 09-02-1945 [id number]   Patient residing at [facility], admitted for left knee replacement, assigned Dickie La, LCSW to follow during SNF stay.  Dickie La, LCSW to notify Jodi Mourning, RN upon patients discharge from SNF.  [name]. Sharlee Blew [facility] Care Management [facility] CM Assistant Phone: [phone] Fax: [phone]

## 2015-01-13 NOTE — Patient Outreach (Signed)
[facility]) Care Management  01/13/2015  ARASELI SHERRY [birth year] [id number]   Assessment-CSW Dickie La received referral from [facility] Nena Polio on 01/13/15 due to SNF admission. CSW completed chart review. CSW attempted initial outreach contact by phone call to [facility]. CSW informed staff of [facility] services. [facility] stated that there was no phone in her room and that they would see if the Nurse could see if patient was available. Nurse stated that she was not in room and to try cell phone if she had a number or to call back later. CSW then called patient's cell phone and left HIPPA compliant voice message asking for her to return call.  Plan-CSW will await call back from patient.   Dickie La, BSW, MSW, LCSW [email or web address] Phone: [phone] Fax: [phone]

## 2015-01-16 ENCOUNTER — Encounter: Payer: Self-pay | Admitting: Licensed Clinical Social Worker

## 2015-01-16 NOTE — Patient Outreach (Signed)
[facility]) Care Management  [facility] Social Work  01/16/2015  KIMLA FURTH 10-May-1946 [id number]   Current Medications:  Current Outpatient Prescriptions  Medication Sig Dispense Refill  . albuterol (PROVENTIL HFA;VENTOLIN HFA) 108 (90 BASE) MCG/ACT inhaler Inhale into the lungs every 6 (six) hours as needed for wheezing or shortness of breath.    Marland Kitchen apixaban (ELIQUIS) 2.5 MG TABS tablet Take 1 tablet (2.5 mg total) by mouth every 12 (twelve) hours. 60 tablet 0  . baclofen (LIORESAL) 20 MG tablet Take 20 mg by mouth daily as needed for muscle spasms.   5  . budesonide-formoterol (SYMBICORT) 80-4.5 MCG/ACT inhaler Inhale 2 puffs into the lungs 2 (two) times daily.    Marland Kitchen docusate sodium (COLACE) 100 MG capsule Take 1 capsule (100 mg total) by mouth 2 (two) times daily. 60 capsule 0  . HYDROmorphone (DILAUDID) 2 MG tablet Take 1-2 tablets (2-4 mg total) by mouth every 4 (four) hours as needed for severe pain. 30 tablet 0  . latanoprost (XALATAN) 0.005 % ophthalmic solution Place 1 drop into both eyes at bedtime.   12  . LORazepam (ATIVAN) 0.5 MG tablet Take 1 tablet (0.5 mg total) by mouth every 6 (six) hours as needed for anxiety. 40 tablet 0  . metFORMIN (GLUCOPHAGE) 500 MG tablet Take 500 mg by mouth 2 (two) times daily with a meal.    . omeprazole (PRILOSEC) 40 MG capsule Take 40 mg by mouth daily as needed (heartburn).     . polyethylene glycol (MIRALAX / GLYCOLAX) packet 17grams in 16 oz of water twice a day until bowel movement.  LAXITIVE.  Restart if two days since last bowel movement 14 each 0  . pravastatin (PRAVACHOL) 80 MG tablet Take 80 mg by mouth at bedtime.    . valsartan (DIOVAN) 160 MG tablet Take 1 tablet (160 mg total) by mouth daily. 30 tablet 0  . zolpidem (AMBIEN) 10 MG tablet Take 10 mg by mouth at bedtime.      No current facility-administered medications for this visit.    Functional Status:  In your present state of health, do you have any  difficulty performing the following activities: 12/30/2014 10/28/2014  Hearing? N N  Vision? Y N  Difficulty concentrating or making decisions? N N  Walking or climbing stairs? Y N  Dressing or bathing? N N  Doing errands, shopping? N N  Preparing Food and eating ? - N  Using the Toilet? - N  In the past six months, have you accidently leaked urine? - N  Do you have problems with loss of bowel control? - N  Managing your Medications? - N  Managing your Finances? - N  Housekeeping or managing your Housekeeping? - N    Fall/Depression Screening:  PHQ 2/9 Scores 01/16/2015 10/28/2014 09/30/2014  PHQ - 2 Score 2 0 0  PHQ- 9 Score 5 - -    Assessment: CSW attempted 3 to 4 calls to patient and then went to [facility] to check on patient. Patient was in her room 217 and appeared to be drowsy and groggy. CSW introduced self , reason for visit and of [facility] Care Management Services. Patient reported that she remembered working with [facility] before. Patient stated that she is unsure how to use phone. CSW assisted her with this process. Patient has been admitted into SNF due to left knee surgery. Patient reports that she has already started to feel relief from knee since surgery. Patient reported that  she has just got back from occupational therapy and is tired but would like to attempt initial session. CSW provided patient with [facility] journal and welcoming packet. Patient also signed consent form. CSW started to ask intake questions and patient would drift off into a sleep. CSW suggested that she come back later and patient stated she rather complete it now and enjoyed the company. After many failed multiple attempts to complete assessment, CSW informed patient that she would come back next week to gather further information at a better time. Patient agreed that this would be best. Before leaving, patient asked CSW to help put incontinence supplies in drawer. CSW completed this and then left. While passing  patient's aid, CSW informed her of patient's drowsiness and they agreed to check on her after I left.  Plan: CSW will complete assessment during next scheduled visit.  Dickie La, BSW, MSW, LCSW [email or web address] Phone: [phone] Fax: [phone]

## 2015-01-19 ENCOUNTER — Other Ambulatory Visit: Payer: Self-pay | Admitting: Licensed Clinical Social Worker

## 2015-01-19 ENCOUNTER — Ambulatory Visit: Payer: Commercial Managed Care - HMO | Admitting: *Deleted

## 2015-01-19 NOTE — Patient Outreach (Signed)
[facility]) Care Management  01/19/2015  Brooke Davidson May 20, 1946 [id number]   Assessment-Patient had left 12 text messages and 4 voice messages to CSW over the weekend stating that she wished to talk to CSW and wanted to leave SNF. CSW received call from patient on 01/19/15. HIPPA verifications were received. CSW informed her of 24 hour nursing line and stated that CSW would not be available after 5 pm on business days nor is CSW available for contact on weekends. Patient expressed understanding. Patient stated that she does not like staying at [facility] and wishes to receive physical and occupational therapy at home. CSW informed her to discuss her concerns with Dr. Alben Spittle at her upcoming appointment and consider pros and cons of this decision. Patient presented disorganized speech and behavior during phone call stating various times that she hears people all throughout the night, cannot get any sleep, and that SNF continues to feed her eggs and that she is allergic to eggs. Patient became upset while talking and CSW calmed down patient during call successfully. CSW encouraged patient to discuss her concerns with her nurse as well. Patient agreed to do so and then stated that she had to get off the phone immediately.   Plan-CSW will remain available to patient during working hours. CSW will contact patient tomorrow.  Dickie La, BSW, MSW, LCSW [email or web address] Phone: [phone] Fax: [phone]

## 2015-01-26 ENCOUNTER — Other Ambulatory Visit: Payer: Self-pay | Admitting: *Deleted

## 2015-01-26 ENCOUNTER — Encounter: Payer: Self-pay | Admitting: Licensed Clinical Social Worker

## 2015-01-26 NOTE — Patient Outreach (Signed)
[facility]) Care Management  [facility] Social Work  01/26/2015  Brooke Davidson [birth year] [id number]    Current Medications:  Current Outpatient Prescriptions  Medication Sig Dispense Refill  . albuterol (PROVENTIL HFA;VENTOLIN HFA) 108 (90 BASE) MCG/ACT inhaler Inhale into the lungs every 6 (six) hours as needed for wheezing or shortness of breath.    Marland Kitchen apixaban (ELIQUIS) 2.5 MG TABS tablet Take 1 tablet (2.5 mg total) by mouth every 12 (twelve) hours. 60 tablet 0  . baclofen (LIORESAL) 20 MG tablet Take 20 mg by mouth daily as needed for muscle spasms.   5  . budesonide-formoterol (SYMBICORT) 80-4.5 MCG/ACT inhaler Inhale 2 puffs into the lungs 2 (two) times daily.    Marland Kitchen docusate sodium (COLACE) 100 MG capsule Take 1 capsule (100 mg total) by mouth 2 (two) times daily. 60 capsule 0  . HYDROmorphone (DILAUDID) 2 MG tablet Take 1-2 tablets (2-4 mg total) by mouth every 4 (four) hours as needed for severe pain. 30 tablet 0  . latanoprost (XALATAN) 0.005 % ophthalmic solution Place 1 drop into both eyes at bedtime.   12  . LORazepam (ATIVAN) 0.5 MG tablet Take 1 tablet (0.5 mg total) by mouth every 6 (six) hours as needed for anxiety. 40 tablet 0  . metFORMIN (GLUCOPHAGE) 500 MG tablet Take 500 mg by mouth 2 (two) times daily with a meal.    . omeprazole (PRILOSEC) 40 MG capsule Take 40 mg by mouth daily as needed (heartburn).     . polyethylene glycol (MIRALAX / GLYCOLAX) packet 17grams in 16 oz of water twice a day until bowel movement.  LAXITIVE.  Restart if two days since last bowel movement 14 each 0  . pravastatin (PRAVACHOL) 80 MG tablet Take 80 mg by mouth at bedtime.    . valsartan (DIOVAN) 160 MG tablet Take 1 tablet (160 mg total) by mouth daily. 30 tablet 0  . zolpidem (AMBIEN) 10 MG tablet Take 10 mg by mouth at bedtime.      No current facility-administered medications for this visit.    Functional Status:  In your present state of health, do you have any  difficulty performing the following activities: 12/30/2014 10/28/2014  Hearing? N N  Vision? Y N  Difficulty concentrating or making decisions? N N  Walking or climbing stairs? Y N  Dressing or bathing? N N  Doing errands, shopping? N N  Preparing Food and eating ? - N  Using the Toilet? - N  In the past six months, have you accidently leaked urine? - N  Do you have problems with loss of bowel control? - N  Managing your Medications? - N  Managing your Finances? - N  Housekeeping or managing your Housekeeping? - N    Fall/Depression Screening:  PHQ 2/9 Scores 01/16/2015 10/28/2014 09/30/2014  PHQ - 2 Score 2 0 0  PHQ- 9 Score 5 - -    Assessment: CSW has attempted several outreach calls but was unsuccessful in reaching patient. CSW called both home, cell and emergency contact. HIPPA compliant voice message left on cell contact number. During initial visit, CSW was unable to complete assessment as patient was falling in and out of sleep and could not communicate. CSW planned to complete another visit to complete assessment. CSW arrived to SNF to check on patient and staff informed her that she discharged from [facility] on 01/23/15. CSW will continue to attempt outreach to assist with social work needs.  Plan: CSW will  inform RNCM Jodi Mourning of patient discharge from SNF and continue to attempt outreach.  Dickie La, BSW, MSW, LCSW [email or web address] Phone: [phone] Fax: [phone]

## 2015-01-26 NOTE — Patient Outreach (Signed)
Attempt made to contact pt via her home phone as part of transition of care (informed by [facility] SW pt discharged from [facility] 8/12).   HIPPA compliant voice message left with contact number.   Plan to try again tomorrow.    Shayne Alken.   Pierzchala RN CCM [facility] Care Management  [phone]

## 2015-01-26 NOTE — Patient Outreach (Signed)
Attempt made to contact pt for transition of care as this RN CM was notified by [facility] SW [place] ([employer] in [employer]) that pt discharged from [facility] 8/12.  HIPPA compliant voice message left on pt's cell phone with contact number.  RN CM to also try pt's home number.       Shayne Alken.   Jenniferann Stuckert RN CCM [facility] Care Management  [phone]

## 2015-01-27 ENCOUNTER — Other Ambulatory Visit: Payer: Self-pay | Admitting: *Deleted

## 2015-01-27 NOTE — Patient Outreach (Signed)
Transition of care call (week 1): Received a return call from voice message left by RN CM yesterday.  Pt states discharged from [facility] SNF 8/12, hanging on. Pt states recovering from knee surgery is worse than surgery.  Pt states she has a rehab appointment today at [facility] bus to take her.  Pt states she is also going to f/u at the [facility].   Pt reports  saw Dr. Concepcion Elk yesterday,received a  good report, to f/u again 9/16.  Pt states to see Dr. Thurston Hole (surgeon) 9/9.  Pt states she has all of her medications, could not review with RN CM as [facility] bus is coming in 20 minutes, have to get ready, to call RN CM back.     Shayne Alken.   Pierzchala RN CCM [facility] Care Management  [phone]

## 2015-01-28 ENCOUNTER — Other Ambulatory Visit: Payer: Self-pay | Admitting: Licensed Clinical Social Worker

## 2015-01-28 NOTE — Patient Outreach (Signed)
[facility]) Care Management  01/28/2015  Brooke Davidson 1945-06-21 [id number]   Assessment-CSW received return phone call by patient on 01/28/15. Patient reports that she has been calling and has not got in touch with CSW. CSW informed patient that she is calling after hours and that CSW cannot answer phone calls during that time. Patient was receptive to this information. Patient reports that she is doing "okay" but is in need of home health services. Patient reports that it will cost her $45.00 per week and she does not have the funds for it. Patient interrupted CSW several times during phone call while CSW was trying to gather information and then stated that she needed to go and would call back later.   Plan-CSW will await for call back and if not received will contact patient by the end of the week.  Dickie La, BSW, MSW, LCSW [email or web address] Phone: [phone] Fax: [phone]

## 2015-02-03 ENCOUNTER — Other Ambulatory Visit: Payer: Self-pay | Admitting: *Deleted

## 2015-02-03 NOTE — Patient Outreach (Signed)
Transition of care call #2 - attempted at 10:45 am, left message.  Transition of care call #2 - attempted and reached pt but she was unable to talk. I told her I would call her tomorrow.  Almetta Lovely [facility] Care Manager [phone]

## 2015-02-04 ENCOUNTER — Other Ambulatory Visit: Payer: Self-pay | Admitting: *Deleted

## 2015-02-04 NOTE — Patient Outreach (Signed)
Transition of care call #2 completed. Mrs. Qin is status post L total knee arthroplasty on 01/05/15. She went to [facility] for rehabilitation and then home  On 01/23/15. She is attending outpt PT currently.  Mrs. Rebman is very dissatisfied with every aspect of care and with every provider and staff member she has encountered. She voices many complaints including: dislike of [place] in general, care at SNF, feeling like she was discharged too early from SNF, wanting a home health aid to help her with some personal care and not being able to get it, having to pay $45 a visit for  outpt PT, not feeling like she got an adequate therapy session, seeing the orthopedic assistant instead of the doctor. She complains of having difficulty bearing wt on the operative leg when standing for long periods of time.   I listened to her complaints and offered support. I told her I would call [employer] to see if there are any considerations to accommodate her and for a customer service person to contact her.  She is scheduled to have therapy 8/25. 8/29 and 8/31. She is to have an orthopedic appt on 02/23/15.  I suggested she call and request to see Dr. Lavella Hammock instead of the PA at this appt.  Almetta Lovely [facility] Care Manager [phone]

## 2015-02-04 NOTE — Patient Outreach (Signed)
Transition of care call attempted in am and then again in pm. I left a message in the am and did talk to pt in the pm but she was unable to talk at that time. I told her I would call tomorrow.  Almetta Lovely [facility] Care Manager [phone]

## 2015-02-05 ENCOUNTER — Other Ambulatory Visit: Payer: Self-pay | Admitting: *Deleted

## 2015-02-05 ENCOUNTER — Other Ambulatory Visit: Payer: Self-pay | Admitting: Licensed Clinical Social Worker

## 2015-02-05 NOTE — Patient Outreach (Signed)
[facility]) Care Management  02/05/2015  Brooke Davidson 04/20/1946 [id number]   Assessment-CSW was informed by [facility] CMA that nurse Luster Landsberg from [facility] called [facility] office and stated that she had some social needs concerns that she would like to discuss with current [facility] CSW and RNCM. CSW contacted Renee on 02/05/15 to discuss patient. HIPPA verifications were received. Luster Landsberg states that patient has been missing her outpatient therapy appointments and has had trouble with [facility] (long waiting times and late arrival to and from appointments.) Renee had concerns that patient was not making her therapy a priority and that her mental health concerns may be part of that reason. CSW agreed to assist patient with stable transportation from [employer] if patient is willing but informed Luster Landsberg that this would be for 1 medical appointment for week and that there may be other options of transportation that CSW can look into. CSW informed Luster Landsberg that RNCM Jodi Mourning is currently on vacation and that CSW will provide updated information to her. CSW contacted patient after completed call with nurse and HIPPA verifications were received. CSW informed patient that she had contacted her nurse from [facility] and expressed concerns for following through with therapy. Patient stated that she does not like the outpatient therapy there as "they don't really do much with me and it is like they are just trying to get people in and out of there." CSW implement motivational interviewing to encourage patient to reach towards her own health goals which includes therapy. CSW agreed that she wishes to gain back her strength and acknowledges therapy can assist her with that. CSW educated patient on [facility] options that she can consider. Patient reports "I am too busy to be thinking about that right now, I don't have issues right now with that." CSW encouraged patient to at least consider  option to benefit self and well-being. Patient agreed to consideration. Patient reports that she also has had problems with [facility] and expressed her frustrations with having to wait for long periods of time. CSW expressed Engineer, structural and patient was agreeable for CSW to make referral. CSW explained that they will mail out information and consents for her to complete and to mail back. CSW questioned if she could complete home visit today to complete initial assessment and patient stated that she has outpatient therapy today at noon which is very important for patient to attend. Patient reports having stable transportation to outpatient therapy today at [facility]. CSW continued to ask assessment questions over the phone but patient continued to get side tracked. CSW informed patient that RNCM Jodi Mourning will be back from vacation on 02/09/15 in which CSW will be on vacation from 02/09/15-02/13/15 and CSW Chalmers Cater will be covering. Patient expressed understanding to information.   Plan-CSW will inform RNCM Jodi Mourning of updated information. CSW will complete referral to [employer] by 02/05/15. CSW will remain available to patient and inform covering CSW [name] of updated information as well as she will assist with any needs patient has while active CSW is on vacation from 02/09/15 to 02/13/15.    Dickie La, BSW, MSW, LCSW [email or web address] Phone: [phone] Fax: [phone]

## 2015-02-05 NOTE — Patient Outreach (Signed)
Follow up to transition of care call #2.  I called my clinical asst. Director, Livia Snellen, MSN, and discussed pt case. She has some good suggestions for pt and she listened to my report and was going to email our [facility] contact for possible pt contact.  I called Mrs. Rogerson back and she was happy to hear the suggestions although some she decided against. She said she would try to use the electric cart when shopping. She does not want to change her therapy group as they are working with her on a financial plan. She also states she would not be able to pay for private in home care.  She shared that she does have a sister who lives locally but her job and family commitments make it impossible for her to help her out.  I again reminded her she could call me and that Okey Dupre will be following up with her next week.  Almetta Lovely [facility] Care Manager [phone]

## 2015-02-11 ENCOUNTER — Other Ambulatory Visit: Payer: Self-pay | Admitting: *Deleted

## 2015-02-11 NOTE — Patient Outreach (Signed)
Transition of care call (week 3):  Spoke with pt who states granddaughter just got home, wants to talk to her.  RN CM to call back.      Shayne Alken.   Pierzchala RN CCM [facility] Care Management  [phone]

## 2015-02-11 NOTE — Patient Outreach (Signed)
Transition of care call (week 3): Spoke with pt, states knee (left) stiff (recent knee surgery) going to physical therapy (outpatient), has to pay a copay, working out a plan for her.  Pt states they took  her off the walker, walking with cane, told need to bend knee.   Pt states going to physical therapy three days a week, [facility] three days a week.   Pt states f/u with Dr. Concepcion Elk post discharge 8/15, next appointment 9/16.  Pt states taking all medications as ordered.  Pt states blood sugars okay.  Pt states Hydrocodone was  not working, now taking Oxycodone- helping.  Discussed with pt plan to f/u next week telephonically as part of ongoing transition of care.      Plan to f/u 9/6 telephonically as part of transition of care.  Shayne Alken.   Bo Rogue RN CCM [facility] Care Management  [phone]

## 2015-02-12 ENCOUNTER — Encounter: Payer: Self-pay | Admitting: *Deleted

## 2015-02-17 ENCOUNTER — Other Ambulatory Visit: Payer: Self-pay | Admitting: *Deleted

## 2015-02-17 NOTE — Patient Outreach (Addendum)
Transition of care call (week 4, discharged from [facility] 8/12):  Pt states knee (left) hurting.  Pt states Oxycodone taking edge a little, better than when taking Hydrocodone.  Pt states pain is worse at night.  Pt reports still going to outpatient physical therapy.  Pt states sugars are okay, numbers the same, today 101,staying around 100.  RN CM discussed doing a home visit 9/13 to which she states to f/u with physical therapy 9/13 and 9/14 (due to holiday- Labor day), 9/13 not a good day.   As agreed, home visit scheduled for 9/15.     Plan to f/u  with pt 9/15- home visit.

## 2015-02-23 ENCOUNTER — Ambulatory Visit (HOSPITAL_COMMUNITY)
Admission: RE | Admit: 2015-02-23 | Discharge: 2015-02-23 | Disposition: A | Discharge status: Home / Self Care | Payer: Commercial Managed Care - HMO | Source: Ambulatory Visit | Attending: Internal Medicine | Admitting: Internal Medicine

## 2015-02-23 ENCOUNTER — Other Ambulatory Visit (HOSPITAL_COMMUNITY): Payer: Self-pay | Admitting: Internal Medicine

## 2015-02-23 DIAGNOSIS — R911 Solitary pulmonary nodule: Secondary | ICD-10-CM | POA: Insufficient documentation

## 2015-02-23 DIAGNOSIS — J4520 Mild intermittent asthma, uncomplicated: Secondary | ICD-10-CM

## 2015-02-23 DIAGNOSIS — R918 Other nonspecific abnormal finding of lung field: Secondary | ICD-10-CM | POA: Insufficient documentation

## 2015-02-23 DIAGNOSIS — J45909 Unspecified asthma, uncomplicated: Secondary | ICD-10-CM | POA: Diagnosis not present

## 2015-02-26 ENCOUNTER — Ambulatory Visit: Payer: Commercial Managed Care - HMO | Admitting: *Deleted

## 2015-02-26 ENCOUNTER — Other Ambulatory Visit: Payer: Self-pay | Admitting: Licensed Clinical Social Worker

## 2015-02-26 NOTE — Patient Outreach (Signed)
[facility]) Care Management  02/26/2015  Brooke Davidson [birth year] [id number]   Assessment-CSW attempted to make outreach to patient on 02/26/15 to gather updates on transportation but was unsuccessful in reaching her. CSW left a HIPPA compliant voice message encouraging patient to return call once available.  Plan-If CSW has not heard back from patient by 03/02/15, then CSW will make second outreach attempt.  Marland KitchenDickie La, BSW, MSW, LCSW [email or web address] Phone: [phone] Fax: [phone]

## 2015-03-04 ENCOUNTER — Other Ambulatory Visit: Payer: Self-pay | Admitting: *Deleted

## 2015-03-04 NOTE — Patient Outreach (Signed)
Follow up phone call:  Pt reports doing good. RN CM discussed doing a home visit to which pt states moving, so busy with physical therapy, going three days a week.  Pt states on the table now putting ice on knee (left- recent surgery).  As discussed, RN CM to call back in 2 days, discussed doing home visit next week.   To  complete care plan (transition of care).   F/u with pt again telephonically in 2 days.     Shayne Alken.   Pierzchala RN CCM [facility] Care Management  [phone]

## 2015-03-05 ENCOUNTER — Other Ambulatory Visit: Payer: Self-pay | Admitting: Licensed Clinical Social Worker

## 2015-03-05 NOTE — Patient Outreach (Signed)
[facility]) Care Management  03/05/2015  Brooke Davidson [birth year] [id number]   Assessment-CSW attempted outreach to patient on 03/05/15. Patient answered phone , provided HIPPA verifications and stated that she was unable to talk on the phone right now and that she would contact CSW back once she was available and hung up.  Plan-CSW will await call back from patient once available.  Dickie La, BSW, MSW, LCSW [email or web address] Phone: [phone] Fax: [phone]

## 2015-03-06 ENCOUNTER — Other Ambulatory Visit: Payer: Self-pay | Admitting: *Deleted

## 2015-03-06 NOTE — Patient Outreach (Signed)
Follow up phone call: Spoke with pt who states busy right now, can I call your back to which RN CM agreed.   Wait for f/u call from pt, discuss scheduling home visit with pt.     Shayne Alken.   Pierzchala RN CCM [facility] Care Management  [phone]

## 2015-03-13 ENCOUNTER — Other Ambulatory Visit: Payer: Self-pay | Admitting: Licensed Clinical Social Worker

## 2015-03-13 NOTE — Patient Outreach (Signed)
[facility]) Care Management  03/13/2015  Brooke Davidson 1945-06-17 [id number]   Assessment-CSW attempted outreach to patient since not receiving return call after contact attempt on 03/05/15. CSW was unable to reach patient and left a HIPPA compliant voice message encouraging patient to return call back to CSW if still interested in social work services.  Plan-CSW will attempt another outreach on 03/19/15 if return call from patient has not been made.  Brooke Davidson, Brooke Davidson, Brooke Davidson, Brooke Davidson [email or web address] Phone: [phone] Fax: [phone]

## 2015-03-19 ENCOUNTER — Other Ambulatory Visit: Payer: Self-pay | Admitting: Licensed Clinical Social Worker

## 2015-03-19 NOTE — Patient Outreach (Signed)
[facility] ([facility]) Care Management  10/6[facility]  Brooke Davidson 11-Nov-1945 [id number]  Assessment-CSW completed third outreach to patient but was unsuccessful in reaching her. CSW left a HIPPA compliant voice message encouraging patient to return call if still interested in [facility] social work services.  Plan-CSW will send outreach letter to patient and await 10 days to hear back from patient before discharging patient from caseload.  Dickie La, BSW, MSW, LCSW [email or web address] Phone: [phone] Fax: [phone]

## 2015-04-02 ENCOUNTER — Encounter: Payer: Self-pay | Admitting: Licensed Clinical Social Worker

## 2015-04-02 NOTE — Patient Outreach (Signed)
[facility]) Care Management  04/02/2015  Glean SalvoSandra H Ehlert 1946/03/29 [id number]   Assessment-CSW sent out barrier letter to patient on 03/19/15 and has waited over 10 business days to hear back from patient.  Plan-CSW will now discharge patient from caseload and will notify RNCM.  Marland Kitchen.Dickie LaBrooke Kaelani Kendrick, BSW, MSW, LCSW [facility] Care Management [email or web address] Phone: [phone] Fax: [phone]

## 2015-04-13 ENCOUNTER — Other Ambulatory Visit: Payer: Self-pay | Admitting: *Deleted

## 2015-04-13 NOTE — Patient Outreach (Signed)
Attempt made to contact pt, check on status,schedule home visit.    HIPPA compliant voice message left with contact number.   If no response, will try again.    Shayne Alkenose M.   Pierzchala RN CCM [facility] Care Management  [phone]

## 2015-04-16 ENCOUNTER — Encounter: Payer: Self-pay | Admitting: *Deleted

## 2015-04-16 ENCOUNTER — Other Ambulatory Visit: Payer: Self-pay | Admitting: *Deleted

## 2015-04-16 NOTE — Patient Outreach (Signed)
Follow up phone call: Pt reports her left knee is getting better, it is her other leg that is bothering her.  Pt states cannot get the right  knee surgery done until June 2017.  Pt reports going to therapy- makes right leg hurts worse.  Pt states to see Dr. Jeanie Cooks 11/11.  Pt states [facility] doe not call me.  Pt states she does not have any pain medication, surgeon will not give her any more, was told  need to f/u with Primary Care MD.  Pt states she will ask Dr. Jeanie Cooks when she sees him.   RN CM inquired about pt's blood sugars to which pt reports misplaced her machine (glucometer) when she moved (3 weeks ago), have not unpacked.   Pt states her BP is okay- 130/78.   RN CM discussed doing a home visit to which pt states don't need to come out, a waste of time, just waiting for next surgery.   With no case management needs at this time, informed pt of RN CM's  plan to close her case to which pt was agreeable to.     Plan to inform  Lattie Haw ([place] management assistant) via in basket to close case, goals met.  Plan to inform pt's MD- Dr. Jeanie Cooks of plan to discharge pt- in Epic fax case closure letter.    Plan to have case closure letter sent to pt.      Zara Chess.   [place] Care Management  [phone]

## 2015-04-17 ENCOUNTER — Other Ambulatory Visit: Payer: Self-pay

## 2015-04-17 NOTE — Patient Outreach (Signed)
She has not indicated any pharmacy needs so I am closing the case to pharmacy.   Steve Rattlerawn Loralie Malta, PharmD, [employer]ACP [facility] Pharmacy Manager [phone]

## 2015-04-22 NOTE — Patient Outreach (Signed)
[facility]) Care Management  04/22/2015  Brooke Davidson 27-Feb-1946 [id number]   Notification from [name], RN to close case due to goals met with [place] Management.  Thanks, [name]. [place], [place] Assistant Phone: [phone] Fax: [phone]

## 2015-06-17 DIAGNOSIS — J4520 Mild intermittent asthma, uncomplicated: Secondary | ICD-10-CM | POA: Diagnosis not present

## 2015-06-17 DIAGNOSIS — I10 Essential (primary) hypertension: Secondary | ICD-10-CM | POA: Diagnosis not present

## 2015-06-17 DIAGNOSIS — M179 Osteoarthritis of knee, unspecified: Secondary | ICD-10-CM | POA: Diagnosis not present

## 2015-06-17 DIAGNOSIS — E119 Type 2 diabetes mellitus without complications: Secondary | ICD-10-CM | POA: Diagnosis not present

## 2015-06-17 DIAGNOSIS — Z1239 Encounter for other screening for malignant neoplasm of breast: Secondary | ICD-10-CM | POA: Diagnosis not present

## 2015-06-17 DIAGNOSIS — G5602 Carpal tunnel syndrome, left upper limb: Secondary | ICD-10-CM | POA: Diagnosis not present

## 2015-06-22 ENCOUNTER — Ambulatory Visit: Payer: Commercial Managed Care - HMO | Admitting: Obstetrics & Gynecology

## 2015-06-22 ENCOUNTER — Other Ambulatory Visit: Payer: Self-pay | Admitting: Licensed Clinical Social Worker

## 2015-07-08 ENCOUNTER — Ambulatory Visit: Payer: Commercial Managed Care - HMO | Admitting: Obstetrics & Gynecology

## 2015-08-05 ENCOUNTER — Encounter: Payer: Self-pay | Admitting: Gastroenterology

## 2015-08-05 ENCOUNTER — Other Ambulatory Visit: Payer: Self-pay

## 2015-08-05 DIAGNOSIS — Z1231 Encounter for screening mammogram for malignant neoplasm of breast: Secondary | ICD-10-CM

## 2015-08-26 ENCOUNTER — Ambulatory Visit (AMBULATORY_SURGERY_CENTER): Payer: Self-pay | Admitting: *Deleted

## 2015-08-26 VITALS — Ht 63.0 in | Wt 176.0 lb

## 2015-08-26 DIAGNOSIS — Z1211 Encounter for screening for malignant neoplasm of colon: Secondary | ICD-10-CM

## 2015-08-26 MED ORDER — NA SULFATE-K SULFATE-MG SULF 17.5-3.13-1.6 GM/177ML PO SOLN: ORAL | Status: DC

## 2015-08-26 NOTE — Progress Notes (Signed)
Patient denies any allergies to eggs or soy. Patient denies any problems with anesthesia/sedation. Patient denies any oxygen use at home and does not take any diet/weight loss medications.  

## 2015-09-04 ENCOUNTER — Other Ambulatory Visit (HOSPITAL_COMMUNITY)
Admission: RE | Admit: 2015-09-04 | Discharge: 2015-09-04 | Disposition: A | Discharge status: Home / Self Care | Payer: Medicare HMO | Source: Ambulatory Visit | Attending: Obstetrics & Gynecology | Admitting: Obstetrics & Gynecology

## 2015-09-04 ENCOUNTER — Ambulatory Visit (INDEPENDENT_AMBULATORY_CARE_PROVIDER_SITE_OTHER): Payer: Medicare HMO | Admitting: Obstetrics & Gynecology

## 2015-09-04 ENCOUNTER — Encounter: Payer: Self-pay | Admitting: Obstetrics & Gynecology

## 2015-09-04 VITALS — Temp 98.2°F | Ht 62.0 in | Wt 170.8 lb

## 2015-09-04 DIAGNOSIS — Z1151 Encounter for screening for human papillomavirus (HPV): Secondary | ICD-10-CM

## 2015-09-04 DIAGNOSIS — Z01419 Encounter for gynecological examination (general) (routine) without abnormal findings: Secondary | ICD-10-CM

## 2015-09-04 DIAGNOSIS — R87612 Low grade squamous intraepithelial lesion on cytologic smear of cervix (LGSIL): Secondary | ICD-10-CM

## 2015-09-04 DIAGNOSIS — Z01411 Encounter for gynecological examination (general) (routine) with abnormal findings: Secondary | ICD-10-CM | POA: Diagnosis present

## 2015-09-04 DIAGNOSIS — Z124 Encounter for screening for malignant neoplasm of cervix: Secondary | ICD-10-CM

## 2015-09-04 NOTE — Progress Notes (Signed)
Patient ID: Brooke Davidson, female   DOB: 26-May-1946, 70 y.o.   MRN: [id number] Subjective:     Brooke SalvoSandra H Tovey is a 70 y.o. female here for a routine exam.  Current complaints: pt reports h/o abnormal PA last year. Pt denies PMPB. Pt not sexually active.   Gynecologic History No LMP recorded. Patient is postmenopausal. Contraception: post menopausal status Last Pap: 08/04/2014 . Results were: LGSIL Last mammogram: 09/29/2014. Results were: normal  Obstetric History OB History  Gravida Para Term Preterm AB SAB TAB Ectopic Multiple Living  3 2 2  1  1   2     # Outcome Date GA Lbr Len/2nd Weight Sex Delivery Anes PTL Lv  3 TAB           2 Term           1 Term                The following portions of the patient's history were reviewed and updated as appropriate: allergies, current medications, past family history, past medical history, past social history, past surgical history and problem list.  Review of Systems Pertinent items are noted in HPI.    Objective:   Temp(Src) 98.2 F (36.8 C) (Oral)  Ht 5\' 2"  (1.575 m)  Wt 170 lb 12.8 oz (77.474 kg)  BMI 31.23 kg/m2 General Appearance:    Alert, cooperative, no distress, appears stated age  Head:    Normocephalic, without obvious abnormality, atraumatic  Eyes:    conjunctiva/corneas clear, EOM's intact, both eyes  Ears:    Normal external ear canals, both ears  Nose:   Nares normal, septum midline, mucosa normal, no drainage    or sinus tenderness  Throat:   Lips, mucosa, and tongue normal; teeth and gums normal  Neck:   Supple, symmetrical, trachea midline, no adenopathy;    thyroid:  no enlargement/tenderness/nodules  Back:     Symmetric, no curvature, ROM normal, no CVA tenderness  Lungs:     Clear to auscultation bilaterally, respirations unlabored  Chest Wall:    No tenderness or deformity   Heart:    Regular rate and rhythm, S1 and S2 normal, no murmur, rub   or gallop  Breast Exam:    No tenderness, masses, or  nipple abnormality  Abdomen:     Soft, non-tender, bowel sounds active all four quadrants,    no masses, no organomegaly  Genitalia:    Normal female without lesion, discharge or tenderness; cervix: stenotic     Extremities:   Extremities normal, atraumatic, no cyanosis or edema; well healed scar on left knee; right knee with brace.  Pulses:   2+ and symmetric all extremities  Skin:   Skin color, texture, turgor normal, no rashes or lesions    Assessment:    Healthy female exam.   H/o LGSIL PAP   Plan:    Mammogram ordered. Follow up in: 1 year.    F/u PAP with hrHPV  Addalie Calles L. Harraway-Smith, M.D., [employer] CoreFACOG

## 2015-09-07 LAB — CYTOLOGY - PAP

## 2015-09-09 ENCOUNTER — Other Ambulatory Visit: Payer: Self-pay

## 2015-09-09 ENCOUNTER — Encounter: Payer: Self-pay | Admitting: Gastroenterology

## 2015-09-09 ENCOUNTER — Telehealth: Payer: Self-pay | Admitting: *Deleted

## 2015-09-09 ENCOUNTER — Ambulatory Visit (AMBULATORY_SURGERY_CENTER): Payer: Medicare HMO | Admitting: Gastroenterology

## 2015-09-09 VITALS — BP 114/76 | HR 47 | Temp 98.1°F | Resp 14 | Ht 63.0 in | Wt 176.0 lb

## 2015-09-09 DIAGNOSIS — Z1211 Encounter for screening for malignant neoplasm of colon: Secondary | ICD-10-CM

## 2015-09-09 DIAGNOSIS — Z538 Procedure and treatment not carried out for other reasons: Secondary | ICD-10-CM

## 2015-09-09 LAB — GLUCOSE, CAPILLARY
GLUCOSE-CAPILLARY: 80 mg/dL (ref 65–99)
Glucose-Capillary: 95 mg/dL (ref 65–99)

## 2015-09-09 MED ORDER — SODIUM CHLORIDE 0.9 % IV SOLN: 500.0000 mL | INTRAVENOUS | Status: DC

## 2015-09-09 NOTE — Telephone Encounter (Signed)
Rene Kocheregina,  Per pt's report, Dr. Adela LankArmbruster stated repeat colonoscopy vs cologuard.  He stated verbally to me that he wanted a cologuard done- no repeat colonoscopy at this time.  That is why I didn't schedule a colonoscopy.  I told the pt that I would inform you of Dr. Lanetta InchArmbruster's order and cologuard information would be mailed to her

## 2015-09-09 NOTE — Patient Instructions (Signed)
YOU HAD AN ENDOSCOPIC PROCEDURE TODAY AT THE [facility]:   Refer to the procedure report that was given to you for any specific questions about what was found during the examination.  If the procedure report does not answer your questions, please call your gastroenterologist to clarify.  If you requested that your care partner not be given the details of your procedure findings, then the procedure report has been included in a sealed envelope for you to review at your convenience later.  YOU SHOULD EXPECT: Some feelings of bloating in the abdomen. Passage of more gas than usual.  Walking can help get rid of the air that was put into your GI tract during the procedure and reduce the bloating. If you had a lower endoscopy (such as a colonoscopy or flexible sigmoidoscopy) you may notice spotting of blood in your stool or on the toilet paper. If you underwent a bowel prep for your procedure, you may not have a normal bowel movement for a few days.  Please Note:  You might notice some irritation and congestion in your nose or some drainage.  This is from the oxygen used during your procedure.  There is no need for concern and it should clear up in a day or so.  SYMPTOMS TO REPORT IMMEDIATELY:   Following lower endoscopy (colonoscopy or flexible sigmoidoscopy):  Excessive amounts of blood in the stool  Significant tenderness or worsening of abdominal pains  Swelling of the abdomen that is new, acute  Fever of 100F or higher  For urgent or emergent issues, a gastroenterologist can be reached at any hour by calling [phone].  DIET: Your first meal following the procedure should be a small meal and then it is ok to progress to your normal diet. Heavy or fried foods are harder to digest and may make you feel nauseous or bloated.  Likewise, meals heavy in dairy and vegetables can increase bloating.  Drink plenty of fluids but you should avoid alcoholic beverages for 24 hours.  ACTIVITY:   You should plan to take it easy for the rest of today and you should NOT DRIVE or use heavy machinery until tomorrow (because of the sedation medicines used during the test).    FOLLOW UP: Our staff will call the number listed on your records the next business day following your procedure to check on you and address any questions or concerns that you may have regarding the information given to you following your procedure. If we do not reach you, we will leave a message.  However, if you are feeling well and you are not experiencing any problems, there is no need to return our call.  We will assume that you have returned to your regular daily activities without incident.  SIGNATURES/CONFIDENTIALITY: You and/or your care partner have signed paperwork which will be entered into your electronic medical record.  These signatures attest to the fact that that the information above on your After Visit Summary has been reviewed and is understood.  Full responsibility of the confidentiality of this discharge information lies with you and/or your care-partner.  Dr. Lanetta InchArmbruster's office nurse will make sure a cologuard test is mailed to you- please follow these instructions and mail specimen back per included instructions  Continue your normal medications and activity

## 2015-09-09 NOTE — Op Note (Signed)
[facility] Patient Name: Brooke Davidson Procedure Date: 09/09/2015 11:11 AM MRN: [id number] Endoscopist: Viviann Spare P. Adela Lank , MD Age: 70 Referring MD:  Date of Birth: 1945/12/19 Gender: Female Procedure:                Colonoscopy Indications:              Screening for colorectal malignant neoplasm Medicines:                Monitored Anesthesia Care Procedure:                Pre-Anesthesia Assessment:                           - Prior to the procedure, a History and Physical                            was performed, and patient medications and                            allergies were reviewed. The patient's tolerance of                            previous anesthesia was also reviewed. The risks                            and benefits of the procedure and the sedation                            options and risks were discussed with the patient.                            All questions were answered, and informed consent                            was obtained. Prior Anticoagulants: The patient has                            taken aspirin, last dose was 1 day prior to                            procedure. ASA Grade Assessment: III - A patient                            with severe systemic disease. After reviewing the                            risks and benefits, the patient was deemed in                            satisfactory condition to undergo the procedure.                           After obtaining informed consent, the colonoscope  was passed under direct vision. Throughout the                            procedure, the patient's blood pressure, pulse, and                            oxygen saturations were monitored continuously. The                            Model PCF-H190L [id number](SN#[id number]) scope was introduced                            through the anus with the intention of advancing to                            the cecum. The scope was  advanced to the sigmoid                            colon before the procedure was aborted. Medications                            were given. The colonoscopy was performed without                            difficulty. The patient tolerated the procedure                            well. The quality of the bowel preparation was                            poor. The rectum was photographed. Scope In: 11:23:12 AM Scope Out: 11:25:09 AM Total Procedure Duration: 0 hours 1 minute 57 seconds  Findings:      The perianal and digital rectal examinations were normal.      A large amount of semi-solid stool was found in the rectum and in the       sigmoid colon, precluding visualization. The bowel preparation was       inadequate for screening purposes and the endoscope could not be passed       safely and thus the procedure was aborted. Retroflexion was not       performed given the poor visualization in the area. Complications:            No immediate complications. Estimated blood loss:                            None. Estimated Blood Loss:     Estimated blood loss: none. Impression:               - Preparation of the colon was poor.                           - Stool in the rectum and in the sigmoid colon, the  procedure was aborted                           - No specimens collected. Recommendation:           - Patient has a contact number available for                            emergencies. The signs and symptoms of potential                            delayed complications were discussed with the                            patient. Return to normal activities tomorrow.                            Written discharge instructions were provided to the                            patient.                           - Resume previous diet.                           - Continue present medications.                           - Repeat colonoscopy for screening purposes using a                             different bowel preparation or consider Cologuard                            for screening purposes if the patient cannot                            tolerate a bowel preparation Procedure Code(s):        --- Professional ---                           [phone], 53, Colonoscopy, flexible; diagnostic,                            including collection of specimen(s) by brushing or                            washing, when performed (separate procedure) CPT copyright 2016 American Medical Association. All rights reserved. Viviann Spare P. Caitlynne Harbeck, MD 09/09/2015 11:29:56 AM This report has been signed electronically. Number of Addenda: 0 Referring MD:      Hubbard Robinson. Ether Griffins, MD

## 2015-09-09 NOTE — Telephone Encounter (Signed)
Order entered in [employer].

## 2015-09-09 NOTE — Progress Notes (Signed)
TO PACU  Awake and Talking Report to RN

## 2015-09-09 NOTE — Progress Notes (Signed)
Per Dr. Adela LankArmbruster, pt is to have cologuard done instead of a repeat colonoscopy at this time.  T.E. Will be sent to Neville Route Miller RN so she is aware and that cologuard can be mailed to be pt

## 2015-09-10 ENCOUNTER — Telehealth: Payer: Self-pay

## 2015-09-10 NOTE — Telephone Encounter (Signed)
  Follow up Call-  Call back number 09/09/2015  Post procedure Call Back phone  # [phone]  Permission to leave phone message No  comments no way for messages     Patient questions:  Do you have a fever, pain , or abdominal swelling? No. Pain Score  0 *  Have you tolerated food without any problems? Yes.    Have you been able to return to your normal activities? Yes.    Do you have any questions about your discharge instructions: Diet   No. Medications  No. Follow up visit  No.  Do you have questions or concerns about your Care? No.  Actions: * If pain score is 4 or above: No action needed, pain <4.

## 2015-09-23 ENCOUNTER — Telehealth: Payer: Self-pay | Admitting: General Practice

## 2015-09-23 NOTE — Telephone Encounter (Signed)
Called patient and informed her of pap results & need for colposcopy. Also informed patient of colpo appt. Patient verbalized understanding & had no questions

## 2015-09-30 ENCOUNTER — Ambulatory Visit
Admission: RE | Admit: 2015-09-30 | Discharge: 2015-09-30 | Disposition: A | Discharge status: Home / Self Care | Payer: Medicare HMO | Source: Ambulatory Visit

## 2015-09-30 DIAGNOSIS — Z1231 Encounter for screening mammogram for malignant neoplasm of breast: Secondary | ICD-10-CM

## 2015-10-08 ENCOUNTER — Telehealth: Payer: Self-pay | Admitting: *Deleted

## 2015-10-08 NOTE — Telephone Encounter (Signed)
-----   Message from Daphine Deutscheregina N Dorie Ohms, RN sent at 09/10/2015  2:05 PM EDT ----- Did patient send in Cologard? SA

## 2015-10-08 NOTE — Telephone Encounter (Signed)
Spoke with patient and she has not sent the kit in yet. She states she is missing the "bracket". Patient will call the company number on the kit and have them send the missing piece or a new kit.

## 2015-10-21 ENCOUNTER — Ambulatory Visit (INDEPENDENT_AMBULATORY_CARE_PROVIDER_SITE_OTHER): Payer: Medicare HMO | Admitting: Obstetrics & Gynecology

## 2015-10-21 ENCOUNTER — Encounter: Payer: Self-pay | Admitting: Obstetrics & Gynecology

## 2015-10-21 VITALS — BP 159/68 | HR 60 | Wt 176.8 lb

## 2015-10-21 DIAGNOSIS — R87612 Low grade squamous intraepithelial lesion on cytologic smear of cervix (LGSIL): Secondary | ICD-10-CM

## 2015-10-21 NOTE — Progress Notes (Signed)
Patient ID: Brooke SalvoSandra H Davidson, female   DOB: 1945-10-26, 70 y.o.   MRN: [id number] Patient given informed consent, signed copy in the chart, time out was performed.  Placed in lithotomy position. Cervix viewed with speculum and colposcope after application of acetic acid.  10/05/2015 Adequacy Reason Satisfactory for evaluation, endocervical/transformation zone component PRESENT. Diagnosis LOW GRADE SQUAMOUS INTRAEPITHELIAL LESION: CIN-1/ HPV (LSIL). Colposcopy adequate?  No- cervix stenotic Acetowhite lesions?no Punctation?no Mosaicism?  no Abnormal vasculature?  no Biopsies?no ECC?no  Given that pt is completely stenotic and has had no bleeding in >20 years will just follow conservatively.  Pt reports abnormal PAP in MD years prev but, does not know the result.  Suspect these changes are related to atrophy.  Rec 1 year f/u or sooner prn bleeding or other pelvic sx. Patient was given post procedure instructions.    Loan Oguin L. Harraway-Smith, M.D., [facility] [employer]ACOG

## 2015-10-21 NOTE — Patient Instructions (Signed)
Colposcopy  Colposcopy is a procedure to examine your cervix and vagina, or the area around the outside of your vagina, for abnormalities or signs of disease. The procedure is done using a lighted microscope called a colposcope. Tissue samples may be collected during the colposcopy if your health care provider finds any unusual cells. A colposcopy may be done if a woman has:  · An abnormal Pap test. A Pap test is a medical test done to evaluate cells that are on the surface of the cervix.  · A Pap test result that is suggestive of human papillomavirus (HPV). This virus can cause genital warts and is linked to the development of cervical cancer.  · A sore on her cervix and the results of a Pap test were normal.  · Genital warts on the cervix or in or around the outside of the vagina.  · A mother who took the drug diethylstilbestrol (DES) while pregnant.  · Painful intercourse.  · Vaginal bleeding, especially after sexual intercourse.  LET YOUR HEALTH CARE PROVIDER KNOW ABOUT:  · Any allergies you have.  · All medicines you are taking, including vitamins, herbs, eye drops, creams, and over-the-counter medicines.  · Previous problems you or members of your family have had with the use of anesthetics.  · Any blood disorders you have.  · Previous surgeries you have had.  · Medical conditions you have.  RISKS AND COMPLICATIONS  Generally, a colposcopy is a safe procedure. However, as with any procedure, complications can occur. Possible complications include:  · Bleeding.  · Infection.  · Missed lesions.  BEFORE THE PROCEDURE   · Tell your health care provider if you have your menstrual period. A colposcopy typically is not done during menstruation.  · For 24 hours before the colposcopy, do not:    [place].    Use tampons.    Use medicines, creams, or suppositories in the vagina.    Have sexual intercourse.  PROCEDURE   During the procedure, you will be lying on your back with your feet in foot rests (stirrups). A warm  metal or plastic instrument (speculum) will be placed in your vagina to keep it open and to allow the health care provider to see the cervix. The colposcope will be placed outside the vagina. It will be used to magnify and examine the cervix, vagina, and the area around the outside of the vagina. A small amount of liquid solution will be placed on the area that is to be viewed. This solution will make it easier to see the abnormal cells. Your health care provider will use tools to suck out mucus and cells from the canal of the cervix. Then he or she will record the location of the abnormal areas.  If a biopsy is done during the procedure, a medicine will usually be given to numb the area (local anesthetic). You may feel mild pain or cramping while the biopsy is done. After the procedure, tissue samples collected during the biopsy will be sent to a lab for analysis.  AFTER THE PROCEDURE   You will be given instructions on when to follow up with your health care provider for your test results. It is important to keep your appointment.     This information is not intended to replace advice given to you by your health care provider. Make sure you discuss any questions you have with your health care provider.     Document Released: 08/20/2002 Document Revised: 01/30/2013 Document Reviewed: 12/27/2012    Elsevier Interactive Patient Education ©2016 [employer]

## 2015-11-10 ENCOUNTER — Telehealth: Payer: Self-pay | Admitting: *Deleted

## 2015-11-10 NOTE — Telephone Encounter (Signed)
Spoke with patient and she will do the test.

## 2015-11-10 NOTE — Telephone Encounter (Signed)
-----   Message from Daphine Deutscheregina N Kail Fraley, RN sent at 10/08/2015 10:26 AM EDT ----- Did patient sent Cologuard in? SA

## 2015-11-18 NOTE — H&P (Signed)
TOTAL KNEE ADMISSION H&P  Patient is being admitted for right total knee arthroplasty.  Subjective:  Chief Complaint:right knee pain.  HPI: Brooke Davidson, 70 y.o. female, has a history of pain and functional disability in the right knee due to arthritis and has failed non-surgical conservative treatments for greater than 12 weeks to includeNSAID's and/or analgesics, corticosteriod injections, viscosupplementation injections, flexibility and strengthening excercises, supervised PT with diminished ADL's post treatment, use of assistive devices and activity modification.  Onset of symptoms was gradual, starting 10 years ago with gradually worsening course since that time. The patient noted prior procedures on the knee to include  arthroscopy and menisectomy on the right knee(s).  Patient currently rates pain in the right knee(s) at 10 out of 10 with activity. Patient has night pain, worsening of pain with activity and weight bearing, pain that interferes with activities of daily living, pain with passive range of motion and joint swelling.  Patient has evidence of subchondral sclerosis, periarticular osteophytes and joint space narrowing by imaging studies.  There is no active infection.  Patient Active Problem List   Diagnosis Date Noted  . DJD (degenerative joint disease) of knee 01/05/2015  . Primary localized osteoarthritis of left knee   . Osteoarthritis   . Schizoaffective disorder (HCC)   . Diabetes mellitus without complication (HCC)   . Hypertension   . HLD (hyperlipidemia)   . Asthma   . Pulmonary nodule   . Glaucoma   . Insomnia   . Well woman exam with routine gynecological exam 08/04/2014   Past Medical History  Diagnosis Date  . Insomnia     takes Ambien nightly   . Glaucoma     uses eye drops daily  . Pulmonary nodule   . HLD (hyperlipidemia)     takes Pravastatin daily  . Osteoarthritis     knees  . Primary localized osteoarthritis of left knee   . Diabetes  mellitus without complication (HCC)     takes Metformin daily  . GERD (gastroesophageal reflux disease)     takes Omeprazole daily  . Asthma     uses Symbicort daily and ALbuterol as needed  . Muscle spasm     takes Baclofen daily as needed  . Hypertension     takes Diovan daily  . Complication of anesthesia     slow to wake up  . Cough     nonproductive and without fever  . History of bronchitis a yr ago  . Joint pain   . Joint swelling   . Chronic back pain   . Eczema   . Urinary frequency   . Urinary urgency   . Depression     but doesn't take any meds   . Schizoaffective disorder [facility])     Past Surgical History  Procedure Laterality Date  . Gallbladder surgery  1997  . Total knee arthroplasty Left 01/05/2015    Procedure: TOTAL KNEE ARTHROPLASTY;  Surgeon: Salvatore Marvel, MD;  Location: [facility] OR;  Service: Orthopedics;  Laterality: Left;    No current facility-administered medications for this encounter.  Current outpatient prescriptions:  .  albuterol (PROVENTIL HFA;VENTOLIN HFA) 108 (90 BASE) MCG/ACT inhaler, Inhale into the lungs every 6 (six) hours as needed for wheezing or shortness of breath., Disp: , Rfl:  .  aspirin 81 MG tablet, Take 81 mg by mouth daily. Reported on 10/21/2015, Disp: , Rfl:  .  budesonide-formoterol (SYMBICORT) 80-4.5 MCG/ACT inhaler, Inhale 2 puffs into the lungs 2 (two) times  daily., Disp: , Rfl:  .  cetirizine (ZYRTEC) 10 MG tablet, Take 10 mg by mouth daily., Disp: , Rfl:  .  latanoprost (XALATAN) 0.005 % ophthalmic solution, Place 1 drop into both eyes at bedtime. , Disp: , Rfl: 12 .  losartan-hydrochlorothiazide (HYZAAR) 100-12.5 MG tablet, Take 0.5 tablets by mouth daily. , Disp: , Rfl: 2 .  meloxicam (MOBIC) 15 MG tablet, Take 15 mg by mouth daily. Reported on 10/21/2015, Disp: , Rfl:  .  metFORMIN (GLUCOPHAGE) 500 MG tablet, Take 500 mg by mouth 2 (two) times daily with a meal., Disp: , Rfl:  .  pravastatin (PRAVACHOL) 80 MG tablet, Take 80  mg by mouth at bedtime., Disp: , Rfl:  .  zolpidem (AMBIEN) 10 MG tablet, Take 10 mg by mouth at bedtime. , Disp: , Rfl:  .  acetaminophen (TYLENOL) 500 MG tablet, Take 500 mg by mouth at bedtime., Disp: , Rfl:     Allergies  Allergen Reactions  . Bactrim [Sulfamethoxazole-Trimethoprim] Swelling    Tongue swells  . Ivp Dye [Iodinated Diagnostic Agents] Hives  . Clonazepam     dizziness  . Iodine Hives  . Sulfa Antibiotics Swelling  . Codeine Itching  . Vicodin [Hydrocodone-Acetaminophen] Itching    Social History  Substance Use Topics  . Smoking status: Former Games developermoker  . Smokeless tobacco: Never Used     Comment: quit smoking 2182yrs ago  . Alcohol Use: No    Family History  Problem Relation Age of Onset  . Heart attack Father   . Glaucoma Mother   . Diabetes Mellitus II Mother   . Hypertension Sister   . Cancer Maternal Grandmother   . Colon cancer Maternal Grandmother      Review of Systems  Constitutional: Negative.   HENT: Negative.   Eyes: Negative.   Respiratory: Negative.   Cardiovascular: Negative.   Gastrointestinal: Negative.   Genitourinary: Negative.   Musculoskeletal: Positive for back pain and joint pain.  Skin: Negative.   Neurological: Negative.   Endo/Heme/Allergies: Negative.   Psychiatric/Behavioral: Negative.     Objective:  Physical Exam  Constitutional: She is oriented to person, place, and time. She appears well-developed and well-nourished.  HENT:  Head: Normocephalic and atraumatic.  Mouth/Throat: Oropharynx is clear and moist.  Eyes: Conjunctivae are normal. Pupils are equal, round, and reactive to light.  Neck: Neck supple.  Cardiovascular: Normal rate and regular rhythm.   Respiratory: Effort normal and breath sounds normal.  GI: Soft.  Genitourinary:  Not pertinent to current symptomatology therefore not examined.  Musculoskeletal:  Examination of her right knee reveals 1-2+ effusion.  Diffuse pain.  Moderate varus deformity.   Range of motion 0-90 degrees.  Knee is stable.  Examination of her left knee reveals well healed total knee incision without swelling or pain.  Full range of motion.  Knee is stable with normal patella tracking.  Vascular exam: Pulses are 2+ and symmetric.  Neurologic exam: Distal motor and sensory examination is within normal limits.         Neurological: She is alert and oriented to person, place, and time.  Skin: Skin is warm and dry.  Psychiatric: She has a normal mood and affect. Her behavior is normal.    Vital signs in last 24 hours:    Labs:   Estimated body mass index is 31.18 kg/(m^2) as calculated from the following:   Height as of 09/09/15: 5\' 3"  (1.6 m).   Weight as of 09/09/15: 79.833 kg (176 lb).  Imaging Review Plain radiographs demonstrate severe degenerative joint disease of the right knee(s). The overall alignment issignificant valgus. The bone quality appears to be good for age and reported activity level.  Assessment/Plan:  End stage arthritis, right knee   The patient history, physical examination, clinical judgment of the provider and imaging studies are consistent with end stage degenerative joint disease of the right knee(s) and total knee arthroplasty is deemed medically necessary. The treatment options including medical management, injection therapy arthroscopy and arthroplasty were discussed at length. The risks and benefits of total knee arthroplasty were presented and reviewed. The risks due to aseptic loosening, infection, stiffness, patella tracking problems, thromboembolic complications and other imponderables were discussed. The patient acknowledged the explanation, agreed to proceed with the plan and consent was signed. Patient is being admitted for inpatient treatment for surgery, pain control, PT, OT, prophylactic antibiotics, VTE prophylaxis, progressive ambulation and ADL's and discharge planning. The patient is planning to be discharged to skilled  nursing facility [facility]

## 2015-11-19 NOTE — Pre-Procedure Instructions (Signed)
Brooke Davidson  11/19/2015      [facility] [address] [address], [place] - [address] AT [facility] [address] [facility] [address] [place] [address] Phone: [phone] Fax: [phone]    Your procedure is scheduled on Mon, June 19 @ 7:15 AM  Report to [facility] Admitting at 5:30 AM  Call this number if you have problems the morning of surgery:  [phone]   Remember:  Do not eat food or drink liquids after midnight.  Take these medicines the morning of surgery with A SIP OF WATER Albuterol<Bring Your Inhaler With You>,Symbicort, and Zyrtec(Cetirizine)              Stop taking your Meloxicam a week prior to surgery. No Goody's,BC's,Aleve,Aspirin,Ibuprofen,Fish Oil,or any Herbal Medications.     How to Manage Your Diabetes Before and After Surgery  Why is it important to control my blood sugar before and after surgery? . Improving blood sugar levels before and after surgery helps healing and can limit problems. . A way of improving blood sugar control is eating a healthy diet by: o  Eating less sugar and carbohydrates o  Increasing activity/exercise o  Talking with your doctor about reaching your blood sugar goals . High blood sugars (greater than 180 mg/dL) can raise your risk of infections and slow your recovery, so you will need to focus on controlling your diabetes during the weeks before surgery. . Make sure that the doctor who takes care of your diabetes knows about your planned surgery including the date and location.  How do I manage my blood sugar before surgery? . Check your blood sugar at least 4 times a day, starting 2 days before surgery, to make sure that the level is not too high or low. o Check your blood sugar the morning of your surgery when you wake up and every 2 hours until you get to the Short Stay unit. . If your blood sugar is less than 70 mg/dL, you will need to treat for low blood sugar: o Do not take  insulin. o Treat a low blood sugar (less than 70 mg/dL) with  cup of clear juice (cranberry or apple), 4 glucose tablets, OR glucose gel. o Recheck blood sugar in 15 minutes after treatment (to make sure it is greater than 70 mg/dL). If your blood sugar is not greater than 70 mg/dL on recheck, call [phone] for further instructions. . Report your blood sugar to the short stay nurse when you get to Short Stay.  . If you are admitted to the hospital after surgery: o Your blood sugar will be checked by the staff and you will probably be given insulin after surgery (instead of oral diabetes medicines) to make sure you have good blood sugar levels. o The goal for blood sugar control after surgery is 80-180 mg/dL.              WHAT DO I DO ABOUT MY DIABETES MEDICATION?   Marland Kitchen Do not take oral diabetes medicines (pills) the morning of surgery.  .        .   . The day of surgery, do not take other diabetes injectables, including Byetta (exenatide), Bydureon (exenatide ER), Victoza (liraglutide), or Trulicity (dulaglutide).  . If your CBG is greater than 220 mg/dL, you may take  of your sliding scale (correction) dose of insulin.  Other Instructions:  Patient Signature:  Date:   Nurse Signature:  Date:   Reviewed and Endorsed by [facility] Patient Education Committee, August 2015   Do not wear jewelry, make-up or nail polish.  Do not wear lotions, powders, or perfumes.   Do not shave 48 hours prior to surgery.    Do not bring valuables to the hospital.  [facility] is not responsible for any belongings or valuables.  Contacts, dentures or bridgework may not be worn into surgery.  Leave your suitcase in the car.  After surgery it may be brought to your room.  For patients admitted to the hospital, discharge time will be determined by your treatment team.  Patients discharged the day of surgery will not be allowed to drive home.    Special instructions:   [facility] - Preparing for Surgery  Before surgery, you can play an important role.  Because skin is not sterile, your skin needs to be as free of germs as possible.  You can reduce the number of germs on you skin by washing with CHG (chlorahexidine gluconate) soap before surgery.  CHG is an antiseptic cleaner which kills germs and bonds with the skin to continue killing germs even after washing.  Please DO NOT use if you have an allergy to CHG or antibacterial soaps.  If your skin becomes reddened/irritated stop using the CHG and inform your nurse when you arrive at Short Stay.  Do not shave (including legs and underarms) for at least 48 hours prior to the first CHG shower.  You may shave your face.  Please follow these instructions carefully:   1.  Shower with CHG Soap the night before surgery and the                                morning of Surgery.  2.  If you choose to wash your hair, wash your hair first as usual with your       normal shampoo.  3.  After you shampoo, rinse your hair and body thoroughly to remove the                      Shampoo.  4.  Use CHG as you would any other liquid soap.  You can apply chg directly       to the skin and wash gently with scrungie or a clean washcloth.  5.  Apply the CHG Soap to your body ONLY FROM THE NECK DOWN.        Do not use on open wounds or open sores.  Avoid contact with your eyes,       ears, mouth and genitals (private parts).  Wash genitals (private parts)       with your normal soap.  6.  Wash thoroughly, paying special attention to the area where your surgery        will be performed.  7.  Thoroughly rinse your body with warm water from the neck down.  8.  DO NOT shower/wash with your normal soap after using and rinsing off       the CHG Soap.  9.  Pat yourself dry with a clean towel.            10.  Wear clean pajamas.            11.  Place clean sheets on your bed the night of your first shower  and do not        sleep with  pets.  Day of Surgery  Do not apply any lotions/deoderants the morning of surgery.  Please wear clean clothes to the hospital/surgery center.    Please read over the following fact sheets that you were given. Blood Transfusion Information and MRSA Information

## 2015-11-20 ENCOUNTER — Ambulatory Visit (HOSPITAL_COMMUNITY)
Admission: EM | Admit: 2015-11-20 | Discharge: 2015-11-20 | Disposition: A | Discharge status: Home / Self Care | Payer: Medicare HMO | Attending: Family Medicine | Admitting: Family Medicine

## 2015-11-20 ENCOUNTER — Encounter (HOSPITAL_COMMUNITY)
Admission: RE | Admit: 2015-11-20 | Discharge: 2015-11-20 | Disposition: A | Discharge status: Home / Self Care | Payer: Medicare HMO | Source: Ambulatory Visit | Attending: Orthopedic Surgery | Admitting: Orthopedic Surgery

## 2015-11-20 ENCOUNTER — Telehealth: Payer: Self-pay

## 2015-11-20 ENCOUNTER — Encounter (HOSPITAL_COMMUNITY): Payer: Self-pay | Admitting: Emergency Medicine

## 2015-11-20 ENCOUNTER — Encounter (HOSPITAL_COMMUNITY): Payer: Self-pay

## 2015-11-20 DIAGNOSIS — Z0183 Encounter for blood typing: Secondary | ICD-10-CM | POA: Diagnosis not present

## 2015-11-20 DIAGNOSIS — Z01812 Encounter for preprocedural laboratory examination: Secondary | ICD-10-CM | POA: Insufficient documentation

## 2015-11-20 DIAGNOSIS — M1711 Unilateral primary osteoarthritis, right knee: Secondary | ICD-10-CM | POA: Diagnosis not present

## 2015-11-20 DIAGNOSIS — L309 Dermatitis, unspecified: Secondary | ICD-10-CM | POA: Diagnosis not present

## 2015-11-20 LAB — TYPE AND SCREEN
ABO/RH(D): A NEG
ANTIBODY SCREEN: NEGATIVE

## 2015-11-20 LAB — CBC WITH DIFFERENTIAL/PLATELET
BASOS PCT: 0 %
Basophils Absolute: 0 10*3/uL (ref 0.0–0.1)
Eosinophils Absolute: 0.2 10*3/uL (ref 0.0–0.7)
Eosinophils Relative: 3 %
HEMATOCRIT: 37.3 % (ref 36.0–46.0)
Hemoglobin: 12.4 g/dL (ref 12.0–15.0)
Lymphocytes Relative: 43 %
Lymphs Abs: 2.1 10*3/uL (ref 0.7–4.0)
MCH: 27.1 pg (ref 26.0–34.0)
MCHC: 33.2 g/dL (ref 30.0–36.0)
MCV: 81.4 fL (ref 78.0–100.0)
MONO ABS: 0.4 10*3/uL (ref 0.1–1.0)
MONOS PCT: 8 %
NEUTROS ABS: 2.2 10*3/uL (ref 1.7–7.7)
Neutrophils Relative %: 46 %
Platelets: 332 10*3/uL (ref 150–400)
RBC: 4.58 MIL/uL (ref 3.87–5.11)
RDW: 14.4 % (ref 11.5–15.5)
WBC: 4.9 10*3/uL (ref 4.0–10.5)

## 2015-11-20 LAB — COMPREHENSIVE METABOLIC PANEL
ALBUMIN: 4.1 g/dL (ref 3.5–5.0)
ALT: 11 U/L — ABNORMAL LOW (ref 14–54)
ANION GAP: 9 (ref 5–15)
AST: 16 U/L (ref 15–41)
Alkaline Phosphatase: 86 U/L (ref 38–126)
BILIRUBIN TOTAL: 0.7 mg/dL (ref 0.3–1.2)
BUN: 17 mg/dL (ref 6–20)
CO2: 21 mmol/L — ABNORMAL LOW (ref 22–32)
Calcium: 9.7 mg/dL (ref 8.9–10.3)
Chloride: 109 mmol/L (ref 101–111)
Creatinine, Ser: 1.04 mg/dL — ABNORMAL HIGH (ref 0.44–1.00)
GFR, EST NON AFRICAN AMERICAN: 54 mL/min — AB (ref >60)
GLUCOSE: 92 mg/dL (ref 65–99)
POTASSIUM: 3.8 mmol/L (ref 3.5–5.1)
Sodium: 139 mmol/L (ref 135–145)
TOTAL PROTEIN: 7.3 g/dL (ref 6.5–8.1)

## 2015-11-20 LAB — APTT: aPTT: 32 seconds (ref 24–37)

## 2015-11-20 LAB — SURGICAL PCR SCREEN
MRSA, PCR: NEGATIVE
Staphylococcus aureus: POSITIVE — AB

## 2015-11-20 LAB — PROTIME-INR
INR: 1.01 (ref 0.00–1.49)
PROTHROMBIN TIME: 13.5 s (ref 11.6–15.2)

## 2015-11-20 LAB — GLUCOSE, CAPILLARY: GLUCOSE-CAPILLARY: 95 mg/dL (ref 65–99)

## 2015-11-20 MED ORDER — CHLORHEXIDINE GLUCONATE 4 % EX LIQD: 60.0000 mL | Freq: Once | CUTANEOUS | Status: DC

## 2015-11-20 MED ORDER — CLOBETASOL PROPIONATE 0.05 % EX CREA: 1.0000 "application " | TOPICAL_CREAM | Freq: Two times a day (BID) | CUTANEOUS | Status: DC

## 2015-11-20 NOTE — Progress Notes (Signed)
Mupirocin script called into the [facility] on [address]. Pt notified also

## 2015-11-20 NOTE — Telephone Encounter (Signed)
Cologuard order has been suspended until stool sample is received. Called pt, she states she has been busy getting ready for her knee surgery and forgot about her cologuard. Pt states she will have to done soon.

## 2015-11-20 NOTE — ED Provider Notes (Signed)
CSN: [id number]     Arrival date & time 11/20/15  1942 History   First MD Initiated Contact with Patient 11/20/15 2009     Chief Complaint  Patient presents with  . Rash   (Consider location/radiation/quality/duration/timing/severity/associated sxs/prior Treatment) Patient is a 70 y.o. female presenting with rash. The history is provided by the patient.  Rash Location:  Shoulder/arm Shoulder/arm rash location:  L forearm and R forearm Quality: itchiness and redness   Severity:  Moderate Onset quality:  Sudden Duration:  3 weeks Progression:  Unchanged Chronicity:  Recurrent Context: insect bite/sting   Relieved by:  Nothing   Past Medical History  Diagnosis Date  . Insomnia     takes Ambien nightly   . Glaucoma     uses eye drops daily  . Pulmonary nodule   . HLD (hyperlipidemia)     takes Pravastatin daily  . Osteoarthritis     knees  . Primary localized osteoarthritis of left knee   . Diabetes mellitus without complication (HCC)     takes Metformin daily  . Muscle spasm     takes Baclofen daily as needed  . Complication of anesthesia     slow to wake up  . Cough     nonproductive and without fever  . History of bronchitis a yr ago  . Joint pain   . Joint swelling   . Chronic back pain   . Eczema   . Urinary frequency   . Urinary urgency   . Depression     but doesn't take any meds   . Hypertension     takes Losartan-HCTZ daily  . Asthma     uses Symbicort daily and ALbuterol as needed  . GERD (gastroesophageal reflux disease)     doesn't take any meds   Past Surgical History  Procedure Laterality Date  . Gallbladder surgery  1997  . 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 Heart attack Father   . Glaucoma Mother   . Diabetes Mellitus II Mother   . Hypertension Sister   .  Cancer Maternal Grandmother   . Colon cancer Maternal Grandmother    Social History  Substance Use Topics  . Smoking status: Former Games developermoker  . Smokeless tobacco: Never Used     Comment: quit smoking 2583yrs ago  . Alcohol Use: No   OB History    Gravida Para Term Preterm AB TAB SAB Ectopic Multiple Living   3 2 2  1 1    2      Review of Systems  Constitutional: Negative.   Skin: Positive for rash.  All other systems reviewed and are negative.   Allergies  Bactrim; Ivp dye; Clonazepam; Iodine; Sulfa antibiotics; Codeine; and Vicodin  Home Medications   Prior to Admission medications   Medication Sig Start Date End Date Taking? Authorizing Provider  acetaminophen (TYLENOL) 500 MG tablet Take 500 mg by mouth at bedtime.    Historical Provider, MD  albuterol (PROVENTIL HFA;VENTOLIN HFA) 108 (90 BASE) MCG/ACT inhaler Inhale into the lungs every 6 (six) hours as needed for wheezing or shortness of breath.    Historical Provider, MD  aspirin 81 MG tablet Take 81 mg by mouth daily. Reported on 10/21/2015    Historical Provider, MD  budesonide-formoterol (SYMBICORT) 80-4.5 MCG/ACT inhaler Inhale 2 puffs  into the lungs 2 (two) times daily.    Historical Provider, MD  cetirizine (ZYRTEC) 10 MG tablet Take 10 mg by mouth daily.    Historical Provider, MD  clobetasol cream (TEMOVATE) 0.05 % Apply 1 application topically 2 (two) times daily. 11/20/15   Linna Hoff, MD  latanoprost (XALATAN) 0.005 % ophthalmic solution Place 1 drop into both eyes at bedtime.  07/31/14   Historical Provider, MD  losartan-hydrochlorothiazide (HYZAAR) 100-12.5 MG tablet Take 0.5 tablets by mouth daily.  05/22/15   Historical Provider, MD  meloxicam (MOBIC) 15 MG tablet Take 15 mg by mouth daily. Reported on 10/21/2015    Historical Provider, MD  metFORMIN (GLUCOPHAGE) 500 MG tablet Take 500 mg by mouth 2 (two) times daily with a meal.    Historical Provider, MD  pravastatin (PRAVACHOL) 80 MG tablet Take 80 mg by mouth  at bedtime.    Historical Provider, MD  zolpidem (AMBIEN) 10 MG tablet Take 10 mg by mouth at bedtime.     Historical Provider, MD   Meds Ordered and Administered this Visit  Medications - No data to display  There were no vitals taken for this visit. No data found.   Physical Exam  Constitutional: She is oriented to person, place, and time. She appears well-developed and well-nourished. No distress.  Neurological: She is alert and oriented to person, place, and time.  Skin: Skin is warm and dry. Rash noted.  Dermatitis extensor forearms bilat  Nursing note and vitals reviewed.   ED Course  Procedures (including critical care time)  Labs Review Labs Reviewed - No data to display  Imaging Review No results found.   Visual Acuity Review  Right Eye Distance:   Left Eye Distance:   Bilateral Distance:    Right Eye Near:   Left Eye Near:    Bilateral Near:         MDM   1. Moderate eczema        Linna Hoff, MD 11/20/15 2039

## 2015-11-20 NOTE — Progress Notes (Addendum)
Cardiologist denies  Medical Md is Dr.Edwin Avbuere  Echo denies  [place] > [place]rs [place]  Heart cath denies  EKG request from Dr.Avbuere  CXR in epic from 02-23-15

## 2015-11-20 NOTE — ED Notes (Addendum)
Patient has a rash for 3 1/2 weeks.  Rash is on arms, torso and patient thinks is related to bugs on a tree at her front door.  Rash is red and itches.  Patient has used over the counter remedies with no relief: calamine, benadryl and prescription med clobetasol .

## 2015-11-21 LAB — URINE CULTURE

## 2015-11-21 LAB — HEMOGLOBIN A1C
Hgb A1c MFr Bld: 5.7 % — ABNORMAL HIGH (ref 4.8–5.6)
Mean Plasma Glucose: 117 mg/dL

## 2015-11-22 NOTE — Telephone Encounter (Signed)
Thanks for the follow up. I will await Cologuard results

## 2015-11-24 ENCOUNTER — Encounter (HOSPITAL_COMMUNITY): Payer: Self-pay | Admitting: Emergency Medicine

## 2015-11-24 ENCOUNTER — Encounter (HOSPITAL_COMMUNITY): Payer: Self-pay | Admitting: Anesthesiology

## 2015-11-29 ENCOUNTER — Encounter (HOSPITAL_COMMUNITY): Payer: Self-pay | Admitting: Emergency Medicine

## 2015-11-29 ENCOUNTER — Ambulatory Visit (HOSPITAL_COMMUNITY)
Admission: EM | Admit: 2015-11-29 | Discharge: 2015-11-29 | Disposition: A | Discharge status: Home / Self Care | Payer: Medicare HMO | Attending: Family Medicine | Admitting: Family Medicine

## 2015-11-29 DIAGNOSIS — K5900 Constipation, unspecified: Secondary | ICD-10-CM

## 2015-11-29 MED ORDER — LACTATED RINGERS IV SOLN: INTRAVENOUS | Status: DC

## 2015-11-29 MED ORDER — CEFAZOLIN SODIUM-DEXTROSE 2-4 GM/100ML-% IV SOLN
2.0000 g | INTRAVENOUS | Status: DC
  Filled 2015-11-29: qty 100

## 2015-11-29 MED ORDER — LINACLOTIDE 145 MCG PO CAPS: 145.0000 ug | ORAL_CAPSULE | Freq: Every day | ORAL | Status: DC

## 2015-11-29 NOTE — Discharge Instructions (Signed)
Read instructions below regarding diet. Before taking Linzess try over the counter Citrate of Magnwesium. You may also try Miralax. If no relief fill prescription for Linzess and take as directed. Once your constipation is relieved it would help you to take a stool softner everyday. (Colace (or generic) 200 mg 1-2 times a day.  Constipation, Adult Constipation is when a person:  Poops (has a bowel movement) less than 3 times a week.  Has a hard time pooping.  Has poop that is dry, hard, or bigger than normal. HOME CARE   Eat foods with a lot of fiber in them. This includes fruits, vegetables, beans, and whole grains such as brown rice.  Avoid fatty foods and foods with a lot of sugar. This includes french fries, hamburgers, cookies, candy, and soda.  If you are not getting enough fiber from food, take products with added fiber in them (supplements).  Drink enough fluid to keep your pee (urine) clear or pale yellow.  Exercise on a regular basis, or as told by your doctor.  Go to the restroom when you feel like you need to poop. Do not hold it.  Only take medicine as told by your doctor. Do not take medicines that help you poop (laxatives) without talking to your doctor first. GET HELP RIGHT AWAY IF:   You have bright red blood in your poop (stool).  Your constipation lasts more than 4 days or gets worse.  You have belly (abdominal) or butt (rectal) pain.  You have thin poop (as thin as a pencil).  You lose weight, and it cannot be explained. MAKE SURE YOU:   Understand these instructions.  Will watch your condition.  Will get help right away if you are not doing well or get worse.   This information is not intended to replace advice given to you by your health care provider. Make sure you discuss any questions you have with your health care provider.   Document Released: 11/16/2007 Document Revised: 06/20/2014 Document Reviewed: 03/11/2013 Elsevier Interactive Patient  Education 2016 Elsevier Inc.  High-Fiber Diet Fiber, also called dietary fiber, is a type of carbohydrate found in fruits, vegetables, whole grains, and beans. A high-fiber diet can have many health benefits. Your health care provider may recommend a high-fiber diet to help:  Prevent constipation. Fiber can make your bowel movements more regular.  Lower your cholesterol.  Relieve hemorrhoids, uncomplicated diverticulosis, or irritable bowel syndrome.  Prevent overeating as part of a weight-loss plan.  Prevent heart disease, type 2 diabetes, and certain cancers. WHAT IS MY PLAN? The recommended daily intake of fiber includes:  38 grams for men under age 29.  30 grams for men over age 26.  25 grams for women under age 7.  21 grams for women over age 37. You can get the recommended daily intake of dietary fiber by eating a variety of fruits, vegetables, grains, and beans. Your health care provider may also recommend a fiber supplement if it is not possible to get enough fiber through your diet. WHAT DO I NEED TO KNOW ABOUT A HIGH-FIBER DIET?  Fiber supplements have not been widely studied for their effectiveness, so it is better to get fiber through food sources.  Always check the fiber content on thenutrition facts label of any prepackaged food. Look for foods that contain at least 5 grams of fiber per serving.  Ask your dietitian if you have questions about specific foods that are related to your condition, especially if those  foods are not listed in the following section.  Increase your daily fiber consumption gradually. Increasing your intake of dietary fiber too quickly may cause bloating, cramping, or gas.  Drink plenty of water. Water helps you to digest fiber. WHAT FOODS CAN I EAT? Grains Whole-grain breads. Multigrain cereal. Oats and oatmeal. Brown rice. Barley. Bulgur wheat. Millet. Bran muffins. Popcorn. Rye wafer crackers. Vegetables Sweet potatoes. Spinach. Kale.  Artichokes. Cabbage. Broccoli. Green peas. Carrots. Squash. Fruits Berries. Pears. Apples. Oranges. Avocados. Prunes and raisins. Dried figs. Meats and Other Protein Sources Navy, kidney, pinto, and soy beans. Split peas. Lentils. Nuts and seeds. Dairy Fiber-fortified yogurt. Beverages Fiber-fortified soy milk. Fiber-fortified orange juice. Other Fiber bars. The items listed above may not be a complete list of recommended foods or beverages. Contact your dietitian for more options. WHAT FOODS ARE NOT RECOMMENDED? Grains White bread. Pasta made with refined flour. White rice. Vegetables Fried potatoes. Canned vegetables. Well-cooked vegetables.  Fruits Fruit juice. Cooked, strained fruit. Meats and Other Protein Sources Fatty cuts of meat. Fried Environmental education officerpoultry or fried fish. Dairy Milk. Yogurt. Cream cheese. Sour cream. Beverages Soft drinks. Other Cakes and pastries. Butter and oils. The items listed above may not be a complete list of foods and beverages to avoid. Contact your dietitian for more information. WHAT ARE SOME TIPS FOR INCLUDING HIGH-FIBER FOODS IN MY DIET?  Eat a wide variety of high-fiber foods.  Make sure that half of all grains consumed each day are whole grains.  Replace breads and cereals made from refined flour or white flour with whole-grain breads and cereals.  Replace white rice with brown rice, bulgur wheat, or millet.  Start the day with a breakfast that is high in fiber, such as a cereal that contains at least 5 grams of fiber per serving.  Use beans in place of meat in soups, salads, or pasta.  Eat high-fiber snacks, such as berries, raw vegetables, nuts, or popcorn.   This information is not intended to replace advice given to you by your health care provider. Make sure you discuss any questions you have with your health care provider.   Document Released: 05/30/2005 Document Revised: 06/20/2014 Document Reviewed: 11/12/2013 Elsevier Interactive  Patient Education [employer]2016 Elsevier Inc.

## 2015-11-29 NOTE — ED Notes (Addendum)
PT reports she is having knee surgery tomorrow and has not had a BM in six days which is abnormal. PT used suppositories last night and had a "little bit of soft dark stool." PT has been eating more fiber and attempting to use products. PT inserted a finger in her rectum and could not feel any stool. PT reports LLQ pain with certain movements.

## 2015-11-29 NOTE — Anesthesia Preprocedure Evaluation (Deleted)
Anesthesia Evaluation  Patient identified by MRN, date of birth, ID band Patient awake    Reviewed: Allergy & Precautions, NPO status , Patient's Chart, lab work & pertinent test results  History of Anesthesia Complications Negative for: history of anesthetic complications  Airway Mallampati: II  TM Distance: >3 FB Neck ROM: Full    Dental  (+) Partial Lower, Partial Upper, Dental Advisory Given   Pulmonary asthma , former smoker,    Pulmonary exam normal breath sounds clear to auscultation       Cardiovascular hypertension, Pt. on medications (-) angina(-) Past MI  Rhythm:Regular Rate:Normal     Neuro/Psych PSYCHIATRIC DISORDERS Anxiety Depression schizoaffective   GI/Hepatic Neg liver ROS, GERD  Medicated and Controlled,  Endo/Other  diabetes, Type 2, Oral Hypoglycemic Agents  Renal/GU negative Renal ROS     Musculoskeletal  (+) Arthritis ,   Abdominal   Peds  Hematology negative hematology ROS (+) Ply 332k   Anesthesia Other Findings   Reproductive/Obstetrics                           Anesthesia Physical Anesthesia Plan  ASA: III  Anesthesia Plan: Spinal and MAC   Post-op Pain Management:  Regional for Post-op pain   Induction: Intravenous  Airway Management Planned: Simple Face Mask  Additional Equipment:   Intra-op Plan:   Post-operative Plan:   Informed Consent: I have reviewed the patients History and Physical, chart, labs and discussed the procedure including the risks, benefits and alternatives for the proposed anesthesia with the patient or authorized representative who has indicated his/her understanding and acceptance.   Dental advisory given  Plan Discussed with: CRNA, Anesthesiologist and Surgeon  Anesthesia Plan Comments: (Discussed risks and benefits of and differences between spinal and general. Discussed risks of spinal including headache, backache,  failure, bleeding, infection, and nerve damage. Patient consents to spinal. Questions answered. Coagulation studies and platelet count acceptable.  Discussed risks and benefits of adductor canal block including failure, bleeding, infection, nerve damage, weakness. Discussed that the block may not prevent all of the pain in the knee. Questions answered. Patient consents to block.  CASE CANCELLED BY SURGEON DUE TO LEG RASH)       Anesthesia Quick Evaluation

## 2015-11-29 NOTE — ED Provider Notes (Signed)
CSN: [id number]     Arrival date & time 11/29/15  1326 History   First MD Initiated Contact with Patient 11/29/15 1459     Chief Complaint  Patient presents with  . Constipation    Patient is a 70 y.o. female presenting with constipation. The history is provided by the patient.  Constipation Severity:  Moderate Time since last bowel movement:  1 week Timing:  Constant Progression:  Worsening Chronicity:  Chronic Context: stress   Context: not dehydration, not medication and not narcotics   Stool description:  None produced Unusual stool frequency:  2=3 times a week Relieved by:  Nothing Ineffective treatments:  Enemas, laxatives and diet changes Associated symptoms: no abdominal pain, no anorexia, no back pain, no diarrhea, no dysuria, no fever, no flatus, no hematochezia, no nausea, no urinary retention and no vomiting   Risk factors: hx of abdominal surgery and obesity   Risk factors: no recent antibiotic use, no recent illness, no recent surgery and no recent travel   Pt with h/o chronic constipation reports no BM in > 1 week. Has tried multiple dietary modiications to promote bowel evacuattions w/o success. Has attempted multiple OTC remedies including enemas and OTC meds w/o success. Hs had chronic constipation since a child. Request Rx for med Linzess.  Past Medical History  Diagnosis Date  . Insomnia     takes Ambien nightly   . Glaucoma     uses eye drops daily  . Pulmonary nodule   . HLD (hyperlipidemia)     takes Pravastatin daily  . Osteoarthritis     knees  . Primary localized osteoarthritis of left knee   . Diabetes mellitus without complication (HCC)     takes Metformin daily  . Muscle spasm     takes Baclofen daily as needed  . Complication of anesthesia     slow to wake up  . Cough     nonproductive and without fever  . History of bronchitis a yr ago  . Joint pain   . Joint swelling   . Chronic back pain   . Eczema   . Urinary frequency   . Urinary  urgency   . Depression     but doesn't take any meds   . Hypertension     takes Losartan-HCTZ daily  . Asthma     uses Symbicort daily and ALbuterol as needed  . GERD (gastroesophageal reflux disease)     doesn't take any meds  . Constipation    Past Surgical History  Procedure Laterality Date  . Gallbladder surgery  1997  . 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 Heart attack Father   . Glaucoma Mother   . Diabetes Mellitus II Mother   . Hypertension Sister   . Cancer Maternal Grandmother   . Colon cancer Maternal Grandmother    Social History  Substance Use Topics  . Smoking status: Former Games developermoker  . Smokeless tobacco: Never Used     Comment: quit smoking 7261yrs ago  . Alcohol Use: No   OB History    Gravida Para Term Preterm AB TAB SAB Ectopic Multiple Living   3 2 2  1 1    2      Review of Systems  Constitutional: Negative for fever.  Gastrointestinal: Positive for constipation. Negative  for nausea, vomiting, abdominal pain, diarrhea, hematochezia, anorexia and flatus.  Genitourinary: Negative for dysuria.  Musculoskeletal: Negative for back pain.  All other systems reviewed and are negative.   Allergies  Bactrim; Ivp dye; Clonazepam; Iodine; Sulfa antibiotics; Codeine; and Vicodin  Home Medications   Prior to Admission medications   Medication Sig Start Date End Date Taking? Authorizing Provider  acetaminophen (TYLENOL) 500 MG tablet Take 500 mg by mouth at bedtime.    Historical Provider, MD  albuterol (PROVENTIL HFA;VENTOLIN HFA) 108 (90 BASE) MCG/ACT inhaler Inhale into the lungs every 6 (six) hours as needed for wheezing or shortness of breath.    Historical Provider, MD  aspirin 81 MG tablet Take 81 mg by mouth daily. Reported on 10/21/2015    Historical Provider, MD   budesonide-formoterol (SYMBICORT) 80-4.5 MCG/ACT inhaler Inhale 2 puffs into the lungs 2 (two) times daily.    Historical Provider, MD  cetirizine (ZYRTEC) 10 MG tablet Take 10 mg by mouth daily.    Historical Provider, MD  clobetasol cream (TEMOVATE) 0.05 % Apply 1 application topically 2 (two) times daily. 11/20/15   Linna Hoff, MD  latanoprost (XALATAN) 0.005 % ophthalmic solution Place 1 drop into both eyes at bedtime.  07/31/14   Historical Provider, MD  linaclotide Karlene Einstein) 145 MCG CAPS capsule Take 1 capsule (145 mcg total) by mouth daily before breakfast. 11/29/15   Roma Kayser Stillman Buenger, NP  losartan-hydrochlorothiazide (HYZAAR) 100-12.5 MG tablet Take 0.5 tablets by mouth daily.  05/22/15   Historical Provider, MD  meloxicam (MOBIC) 15 MG tablet Take 15 mg by mouth daily. Reported on 10/21/2015    Historical Provider, MD  metFORMIN (GLUCOPHAGE) 500 MG tablet Take 500 mg by mouth 2 (two) times daily with a meal.    Historical Provider, MD  pravastatin (PRAVACHOL) 80 MG tablet Take 80 mg by mouth at bedtime.    Historical Provider, MD  zolpidem (AMBIEN) 10 MG tablet Take 10 mg by mouth at bedtime.     Historical Provider, MD   Meds Ordered and Administered this Visit  Medications - No data to display  BP 172/65 mmHg  Pulse 65  Temp(Src) 97.6 F (36.4 C) (Oral)  Resp 16  Ht  (1.626 m)  Wt 172 lb (78.019 kg)  BMI 29.51 kg/m2  SpO2 98% No data found.   Physical Exam  Constitutional: She appears well-developed and well-nourished.  HENT:  Head: Normocephalic.  Cardiovascular: Normal rate.   Pulmonary/Chest: Effort normal.  Abdominal: Soft. Bowel sounds are normal. She exhibits distension. She exhibits no mass. There is no tenderness. There is no rebound and no guarding.  Skin: Skin is warm and dry.    ED Course  Procedures (including critical care time)  Labs Review Labs Reviewed - No data to display  Imaging Review No results found.   Visual Acuity Review  Right  Eye Distance:   Left Eye Distance:   Bilateral Distance:    Right Eye Near:   Left Eye Near:    Bilateral Near:         MDM   1. Constipation, unspecified constipation type   Recommended several OTC options not yet tried for relief of constipation. Provided short course of Linzess to fill if OTC recommedations ineffective. Pt instructed that PCP would have to continue any future Rx's for Linzess if pt wishes to continue. Pt agreeable. Instructions for home care and management of constipation discussed and provided in print.  Roma Kayser Jesse Hirst, NP 12/03/15 [phone]

## 2015-11-30 ENCOUNTER — Ambulatory Visit (HOSPITAL_COMMUNITY)
Admission: RE | Admit: 2015-11-30 | Discharge: 2015-11-30 | DRG: 554 | Disposition: A | Discharge status: Home / Self Care | Payer: Medicare HMO | Source: Ambulatory Visit | Attending: Orthopedic Surgery | Admitting: Orthopedic Surgery

## 2015-11-30 ENCOUNTER — Encounter (HOSPITAL_COMMUNITY)
Admission: RE | Disposition: A | Discharge status: Home / Self Care | Payer: Self-pay | Source: Ambulatory Visit | Attending: Orthopedic Surgery

## 2015-11-30 ENCOUNTER — Encounter (HOSPITAL_COMMUNITY): Payer: Self-pay | Admitting: *Deleted

## 2015-11-30 ENCOUNTER — Other Ambulatory Visit: Payer: Self-pay

## 2015-11-30 DIAGNOSIS — Z885 Allergy status to narcotic agent status: Secondary | ICD-10-CM | POA: Diagnosis not present

## 2015-11-30 DIAGNOSIS — K219 Gastro-esophageal reflux disease without esophagitis: Secondary | ICD-10-CM | POA: Diagnosis present

## 2015-11-30 DIAGNOSIS — Z881 Allergy status to other antibiotic agents status: Secondary | ICD-10-CM | POA: Diagnosis not present

## 2015-11-30 DIAGNOSIS — G4700 Insomnia, unspecified: Secondary | ICD-10-CM | POA: Diagnosis not present

## 2015-11-30 DIAGNOSIS — E785 Hyperlipidemia, unspecified: Secondary | ICD-10-CM | POA: Diagnosis not present

## 2015-11-30 DIAGNOSIS — Z883 Allergy status to other anti-infective agents status: Secondary | ICD-10-CM

## 2015-11-30 DIAGNOSIS — Z833 Family history of diabetes mellitus: Secondary | ICD-10-CM | POA: Diagnosis not present

## 2015-11-30 DIAGNOSIS — R911 Solitary pulmonary nodule: Secondary | ICD-10-CM | POA: Diagnosis present

## 2015-11-30 DIAGNOSIS — M62838 Other muscle spasm: Secondary | ICD-10-CM | POA: Diagnosis present

## 2015-11-30 DIAGNOSIS — I10 Essential (primary) hypertension: Secondary | ICD-10-CM | POA: Diagnosis present

## 2015-11-30 DIAGNOSIS — Z7982 Long term (current) use of aspirin: Secondary | ICD-10-CM

## 2015-11-30 DIAGNOSIS — H409 Unspecified glaucoma: Secondary | ICD-10-CM | POA: Diagnosis not present

## 2015-11-30 DIAGNOSIS — F259 Schizoaffective disorder, unspecified: Secondary | ICD-10-CM | POA: Diagnosis present

## 2015-11-30 DIAGNOSIS — E119 Type 2 diabetes mellitus without complications: Secondary | ICD-10-CM | POA: Diagnosis not present

## 2015-11-30 DIAGNOSIS — M1711 Unilateral primary osteoarthritis, right knee: Secondary | ICD-10-CM | POA: Diagnosis present

## 2015-11-30 DIAGNOSIS — Z8249 Family history of ischemic heart disease and other diseases of the circulatory system: Secondary | ICD-10-CM

## 2015-11-30 DIAGNOSIS — Z7984 Long term (current) use of oral hypoglycemic drugs: Secondary | ICD-10-CM

## 2015-11-30 DIAGNOSIS — J45909 Unspecified asthma, uncomplicated: Secondary | ICD-10-CM | POA: Diagnosis present

## 2015-11-30 DIAGNOSIS — Z91041 Radiographic dye allergy status: Secondary | ICD-10-CM

## 2015-11-30 DIAGNOSIS — Z96652 Presence of left artificial knee joint: Secondary | ICD-10-CM | POA: Diagnosis not present

## 2015-11-30 DIAGNOSIS — Z87891 Personal history of nicotine dependence: Secondary | ICD-10-CM

## 2015-11-30 DIAGNOSIS — Z79899 Other long term (current) drug therapy: Secondary | ICD-10-CM

## 2015-11-30 DIAGNOSIS — M25561 Pain in right knee: Secondary | ICD-10-CM | POA: Diagnosis present

## 2015-11-30 DIAGNOSIS — Z91048 Other nonmedicinal substance allergy status: Secondary | ICD-10-CM | POA: Diagnosis not present

## 2015-11-30 HISTORY — DX: Constipation, unspecified: K59.00

## 2015-11-30 LAB — GLUCOSE, CAPILLARY: GLUCOSE-CAPILLARY: 103 mg/dL — AB (ref 65–99)

## 2015-11-30 SURGERY — CANCELLED PROCEDURE
Laterality: Right

## 2015-11-30 MED ORDER — PROPOFOL 10 MG/ML IV BOLUS
INTRAVENOUS | Status: AC
  Filled 2015-11-30: qty 20

## 2015-11-30 MED ORDER — FENTANYL CITRATE (PF) 250 MCG/5ML IJ SOLN
INTRAMUSCULAR | Status: AC
  Filled 2015-11-30: qty 5

## 2015-11-30 MED ORDER — BUPIVACAINE-EPINEPHRINE (PF) 0.25% -1:200000 IJ SOLN
INTRAMUSCULAR | Status: AC
  Filled 2015-11-30: qty 30

## 2015-11-30 MED ORDER — PHENYLEPHRINE 40 MCG/ML (10ML) SYRINGE FOR IV PUSH (FOR BLOOD PRESSURE SUPPORT)
PREFILLED_SYRINGE | INTRAVENOUS | Status: AC
  Filled 2015-11-30: qty 10

## 2015-11-30 MED ORDER — MIDAZOLAM HCL 2 MG/2ML IJ SOLN
INTRAMUSCULAR | Status: AC
  Filled 2015-11-30: qty 2

## 2015-11-30 MED ORDER — ONDANSETRON HCL 4 MG/2ML IJ SOLN
INTRAMUSCULAR | Status: AC
  Filled 2015-11-30: qty 2

## 2015-11-30 NOTE — Progress Notes (Signed)
Pt. Has red  Rash on left leg and right leg. States it came up 4 days ago.

## 2015-11-30 NOTE — Interval H&P Note (Signed)
History and Physical Interval Note:  11/30/2015 6:45 AM  Brooke Davidson  has presented today for surgery, with the diagnosis of Primary localized OA right knee  The various methods of treatment have been discussed with the patient and family. After consideration of risks, benefits and other options for treatment, the patient has consented to  Procedure(s): TOTAL KNEE ARTHROPLASTY (Right) as a surgical intervention .  The patient's history has been reviewed, patient examined, no change in status, stable for surgery.  I have reviewed the patient's chart and labs.  Questions were answered to the patient's satisfaction.     Salvatore MarvelWAINER,Kareli Hossain A

## 2015-12-04 ENCOUNTER — Telehealth: Payer: Self-pay | Admitting: *Deleted

## 2015-12-04 NOTE — Telephone Encounter (Signed)
Dr Havery Moros patient called and wanted to make you aware that she still has her cologuard kit and has been unable to do the test because she hasnt been able to produce a bowel movement. She contacted her PCP about it, they sent her in a prescription of Linzess to take. She said she was going to pick up the medication today after 1pm and try to get the test done when she can, She just wanted to make you aware.

## 2015-12-06 ENCOUNTER — Encounter (HOSPITAL_COMMUNITY): Payer: Self-pay

## 2015-12-06 ENCOUNTER — Emergency Department (HOSPITAL_COMMUNITY)
Admission: EM | Admit: 2015-12-06 | Discharge: 2015-12-07 | Disposition: A | Discharge status: Home / Self Care | Payer: Medicare HMO | Attending: Emergency Medicine | Admitting: Emergency Medicine

## 2015-12-06 DIAGNOSIS — Z7984 Long term (current) use of oral hypoglycemic drugs: Secondary | ICD-10-CM | POA: Insufficient documentation

## 2015-12-06 DIAGNOSIS — E119 Type 2 diabetes mellitus without complications: Secondary | ICD-10-CM | POA: Diagnosis not present

## 2015-12-06 DIAGNOSIS — Z7982 Long term (current) use of aspirin: Secondary | ICD-10-CM | POA: Diagnosis not present

## 2015-12-06 DIAGNOSIS — J45909 Unspecified asthma, uncomplicated: Secondary | ICD-10-CM | POA: Insufficient documentation

## 2015-12-06 DIAGNOSIS — Z87891 Personal history of nicotine dependence: Secondary | ICD-10-CM | POA: Insufficient documentation

## 2015-12-06 DIAGNOSIS — K5732 Diverticulitis of large intestine without perforation or abscess without bleeding: Secondary | ICD-10-CM | POA: Diagnosis not present

## 2015-12-06 DIAGNOSIS — I10 Essential (primary) hypertension: Secondary | ICD-10-CM | POA: Insufficient documentation

## 2015-12-06 DIAGNOSIS — Z96652 Presence of left artificial knee joint: Secondary | ICD-10-CM | POA: Insufficient documentation

## 2015-12-06 DIAGNOSIS — Z79899 Other long term (current) drug therapy: Secondary | ICD-10-CM | POA: Insufficient documentation

## 2015-12-06 DIAGNOSIS — K5900 Constipation, unspecified: Secondary | ICD-10-CM | POA: Diagnosis present

## 2015-12-06 NOTE — ED Notes (Addendum)
Pt seen at urgent care 11-29-15 for constipation.  Last BM was 4 days prior to 11-29-15.  Pt has tried Mag citrate, Polyethylene glycol, ex lax (took 2 hours PTA), foods with little brown watery liquid return.  Pt is complaining of abd pain.

## 2015-12-07 ENCOUNTER — Encounter: Payer: Self-pay | Admitting: Gastroenterology

## 2015-12-07 ENCOUNTER — Emergency Department (HOSPITAL_COMMUNITY): Payer: Medicare HMO

## 2015-12-07 ENCOUNTER — Encounter (HOSPITAL_COMMUNITY): Payer: Self-pay | Admitting: Radiology

## 2015-12-07 LAB — CBC WITH DIFFERENTIAL/PLATELET
BASOS PCT: 1 %
Basophils Absolute: 0 10*3/uL (ref 0.0–0.1)
EOS ABS: 0.2 10*3/uL (ref 0.0–0.7)
Eosinophils Relative: 3 %
HEMATOCRIT: 38.5 % (ref 36.0–46.0)
HEMOGLOBIN: 12.8 g/dL (ref 12.0–15.0)
LYMPHS ABS: 3.2 10*3/uL (ref 0.7–4.0)
Lymphocytes Relative: 46 %
MCH: 27.6 pg (ref 26.0–34.0)
MCHC: 33.2 g/dL (ref 30.0–36.0)
MCV: 83 fL (ref 78.0–100.0)
MONOS PCT: 9 %
Monocytes Absolute: 0.6 10*3/uL (ref 0.1–1.0)
NEUTROS ABS: 3 10*3/uL (ref 1.7–7.7)
NEUTROS PCT: 43 %
Platelets: 279 10*3/uL (ref 150–400)
RBC: 4.64 MIL/uL (ref 3.87–5.11)
RDW: 14 % (ref 11.5–15.5)
WBC: 7 10*3/uL (ref 4.0–10.5)

## 2015-12-07 LAB — URINALYSIS, ROUTINE W REFLEX MICROSCOPIC
BILIRUBIN URINE: NEGATIVE
GLUCOSE, UA: NEGATIVE mg/dL
HGB URINE DIPSTICK: NEGATIVE
KETONES UR: NEGATIVE mg/dL
LEUKOCYTES UA: NEGATIVE
Nitrite: NEGATIVE
PH: 5 (ref 5.0–8.0)
Protein, ur: NEGATIVE mg/dL
Specific Gravity, Urine: 1.014 (ref 1.005–1.030)

## 2015-12-07 LAB — COMPREHENSIVE METABOLIC PANEL
ALBUMIN: 4 g/dL (ref 3.5–5.0)
ALK PHOS: 86 U/L (ref 38–126)
ALT: 11 U/L — AB (ref 14–54)
AST: 17 U/L (ref 15–41)
Anion gap: 10 (ref 5–15)
BILIRUBIN TOTAL: 0.5 mg/dL (ref 0.3–1.2)
BUN: 18 mg/dL (ref 6–20)
CALCIUM: 9.6 mg/dL (ref 8.9–10.3)
CO2: 21 mmol/L — AB (ref 22–32)
CREATININE: 1.2 mg/dL — AB (ref 0.44–1.00)
Chloride: 104 mmol/L (ref 101–111)
GFR calc Af Amer: 53 mL/min — ABNORMAL LOW (ref >60)
GFR calc non Af Amer: 45 mL/min — ABNORMAL LOW (ref >60)
GLUCOSE: 88 mg/dL (ref 65–99)
Potassium: 4.4 mmol/L (ref 3.5–5.1)
SODIUM: 135 mmol/L (ref 135–145)
TOTAL PROTEIN: 7 g/dL (ref 6.5–8.1)

## 2015-12-07 MED ORDER — DIATRIZOATE MEGLUMINE & SODIUM 66-10 % PO SOLN
ORAL | Status: AC
  Filled 2015-12-07: qty 30

## 2015-12-07 MED ORDER — METRONIDAZOLE 500 MG PO TABS: 500.0000 mg | ORAL_TABLET | Freq: Three times a day (TID) | ORAL | Status: DC

## 2015-12-07 MED ORDER — CIPROFLOXACIN HCL 500 MG PO TABS: 500.0000 mg | ORAL_TABLET | Freq: Two times a day (BID) | ORAL | Status: DC

## 2015-12-07 MED ORDER — POLYETHYLENE GLYCOL 3350 17 G PO PACK: PACK | ORAL | Status: DC

## 2015-12-07 MED ORDER — DOCUSATE SODIUM 100 MG PO CAPS: 100.0000 mg | ORAL_CAPSULE | Freq: Two times a day (BID) | ORAL | Status: DC

## 2015-12-07 NOTE — Telephone Encounter (Signed)
Okay thanks for the update. If Linzess does not work she can call us back for further advice.

## 2015-12-07 NOTE — Discharge Instructions (Signed)
Diverticulitis °Diverticulitis is when small pockets that have formed in your colon (large intestine) become infected or swollen. °HOME CARE °· Follow your doctor's instructions. °· Follow a special diet if told by your doctor. °· When you feel better, your doctor may tell you to change your diet. You may be told to eat a lot of fiber. Fruits and vegetables are good sources of fiber. Fiber makes it easier to poop (have bowel movements). °· Take supplements or probiotics as told by your doctor. °· Only take medicines as told by your doctor. °· Keep all follow-up visits with your doctor. °GET HELP IF: °· Your pain does not get better. °· You have a hard time eating food. °· You are not pooping like normal. °GET HELP RIGHT AWAY IF: °· Your pain gets worse. °· Your problems do not get better. °· Your problems suddenly get worse. °· You have a fever. °· You keep throwing up (vomiting). °· You have bloody or black, tarry poop (stool). °MAKE SURE YOU:  °· Understand these instructions. °· Will watch your condition. °· Will get help right away if you are not doing well or get worse. °  °This information is not intended to replace advice given to you by your health care provider. Make sure you discuss any questions you have with your health care provider. °  °Document Released: 11/16/2007 Document Revised: 06/04/2013 Document Reviewed: 04/24/2013 °Elsevier Interactive Patient Education ©2016 Elsevier Inc. ° °High-Fiber Diet °Fiber, also called dietary fiber, is a type of carbohydrate found in fruits, vegetables, whole grains, and beans. A high-fiber diet can have many health benefits. Your health care provider may recommend a high-fiber diet to help: °· Prevent constipation. Fiber can make your bowel movements more regular. °· Lower your cholesterol. °· Relieve hemorrhoids, uncomplicated diverticulosis, or irritable bowel syndrome. °· Prevent overeating as part of a weight-loss plan. °· Prevent heart disease, type 2  diabetes, and certain cancers. °WHAT IS MY PLAN? °The recommended daily intake of fiber includes: °· 38 grams for men under age 50. °· 30 grams for men over age 50. °· 25 grams for women under age 50. °· 21 grams for women over age 50. °You can get the recommended daily intake of dietary fiber by eating a variety of fruits, vegetables, grains, and beans. Your health care provider may also recommend a fiber supplement if it is not possible to get enough fiber through your diet. °WHAT DO I NEED TO KNOW ABOUT A HIGH-FIBER DIET? °· Fiber supplements have not been widely studied for their effectiveness, so it is better to get fiber through food sources. °· Always check the fiber content on the nutrition facts label of any prepackaged food. Look for foods that contain at least 5 grams of fiber per serving. °· Ask your dietitian if you have questions about specific foods that are related to your condition, especially if those foods are not listed in the following section. °· Increase your daily fiber consumption gradually. Increasing your intake of dietary fiber too quickly may cause bloating, cramping, or gas. °· Drink plenty of water. Water helps you to digest fiber. °WHAT FOODS CAN I EAT? °Grains °Whole-grain breads. Multigrain cereal. Oats and oatmeal. Brown rice. Barley. Bulgur wheat. Millet. Bran muffins. Popcorn. Rye wafer crackers. °Vegetables °Sweet potatoes. Spinach. Kale. Artichokes. Cabbage. Broccoli. Green peas. Carrots. Squash. °Fruits °Berries. Pears. Apples. Oranges. Avocados. Prunes and raisins. Dried figs. °Meats and Other Protein Sources °Navy, kidney, pinto, and soy beans. Split peas. Lentils. Nuts and seeds. °  Dairy °Fiber-fortified yogurt. °Beverages °Fiber-fortified soy milk. Fiber-fortified orange juice. °Other °Fiber bars. °The items listed above may not be a complete list of recommended foods or beverages. Contact your dietitian for more options. °WHAT FOODS ARE NOT RECOMMENDED? °Grains °White  bread. Pasta made with refined flour. White rice. °Vegetables °Fried potatoes. Canned vegetables. Well-cooked vegetables.  °Fruits °Fruit juice. Cooked, strained fruit. °Meats and Other Protein Sources °Fatty cuts of meat. Fried poultry or fried fish. °Dairy °Milk. Yogurt. Cream cheese. Sour cream. °Beverages °Soft drinks. °Other °Cakes and pastries. Butter and oils. °The items listed above may not be a complete list of foods and beverages to avoid. Contact your dietitian for more information. °WHAT ARE SOME TIPS FOR INCLUDING HIGH-FIBER FOODS IN MY DIET? °· Eat a wide variety of high-fiber foods. °· Make sure that half of all grains consumed each day are whole grains. °· Replace breads and cereals made from refined flour or white flour with whole-grain breads and cereals. °· Replace white rice with brown rice, bulgur wheat, or millet. °· Start the day with a breakfast that is high in fiber, such as a cereal that contains at least 5 grams of fiber per serving. °· Use beans in place of meat in soups, salads, or pasta. °· Eat high-fiber snacks, such as berries, raw vegetables, nuts, or popcorn. °  °This information is not intended to replace advice given to you by your health care provider. Make sure you discuss any questions you have with your health care provider. °  °Document Released: 05/30/2005 Document Revised: 06/20/2014 Document Reviewed: 11/12/2013 °Elsevier Interactive Patient Education ©2016 Elsevier Inc. ° °

## 2015-12-07 NOTE — ED Provider Notes (Signed)
Medical screening examination/treatment/procedure(s) were conducted as a shared visit with non-physician practitioner(s) and myself.  I personally evaluated the patient during the encounter.   EKG Interpretation None      Pt is a 70 y.o. female who presents emergency department with constipation. See urgent care for the same. Has tried MiraLAX, Ex-Lax, enemas, magnesium citrate and Linzess. Is now having watery stools. Complaining of abdominal pain. No fevers or vomiting. Labs unremarkable. CT shows possible mild diverticulitis. No obstruction, fecal impaction, significant constipation. Have advised her to continue MiraLAX and Colace. We'll discharge with one week of Cipro and Flagyl for possible mild diverticulitis. We'll give her GI and PCP follow-up information. I feel she is safe for discharge.  Layla MawKristen N Ward, DO 12/07/15 [phone]

## 2015-12-07 NOTE — ED Provider Notes (Signed)
CSN: [id number]     Arrival date & time 12/06/15  2158 History   First MD Initiated Contact with Patient 12/06/15 2340     Chief Complaint  Patient presents with  . Constipation     (Consider location/radiation/quality/duration/timing/severity/associated sxs/prior Treatment) HPI Comments: Patient with a history of HLD, DM, chronic back pain, HTN, asthma, GERD presents with complaint of "can't make my bowels move." She reports a concern for obstruction. She has had nausea with rare vomiting, abdominal distention. She states initially she was passing flatus but has not in 2 days. No fever. She was seen at Urgent Care on 11/29/15 and, since that visit, has tried the recommended dietary changes, Miralax, Ex-Lax, Mag citrate. She states that, at most, she has passed a small amount of brown stool with watery movement. She does not feel there is stool in the rectum and does not feel she has to have a BM. No chest pain, urinary symptoms or breathing difficulty.  Patient is a 70 y.o. female presenting with constipation. The history is provided by the patient. No language interpreter was used.  Constipation Severity:  Severe Time since last bowel movement:  10 days Progression:  Worsening Chronicity:  New Stool description:  Small and watery Relieved by:  Nothing Ineffective treatments:  Fiber, enemas, laxatives, Miralax and diet changes Associated symptoms: abdominal pain (reports LLQ pain), nausea and vomiting (x1 only, yesterday)   Associated symptoms: no fever     Past Medical History  Diagnosis Date  . Insomnia     takes Ambien nightly   . Glaucoma     uses eye drops daily  . Pulmonary nodule   . HLD (hyperlipidemia)     takes Pravastatin daily  . Osteoarthritis     knees  . Primary localized osteoarthritis of left knee   . Diabetes mellitus without complication (HCC)     takes Metformin daily  . Muscle spasm     takes Baclofen daily as needed  . Complication of anesthesia    slow to wake up  . Cough     nonproductive and without fever  . History of bronchitis a yr ago  . Joint pain   . Joint swelling   . Chronic back pain   . Eczema   . Urinary frequency   . Urinary urgency   . Depression     but doesn't take any meds   . Hypertension     takes Losartan-HCTZ daily  . Asthma     uses Symbicort daily and ALbuterol as needed  . GERD (gastroesophageal reflux disease)     doesn't take any meds  . Constipation    Past Surgical History  Procedure Laterality Date  . Gallbladder surgery  1997  . 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 Heart attack Father   . Glaucoma Mother   . Diabetes Mellitus II Mother   . Hypertension Sister   . Cancer Maternal Grandmother   . Colon cancer Maternal Grandmother    Social History  Substance Use Topics  . Smoking status: Former Games developermoker  . Smokeless tobacco: Never Used     Comment: quit smoking 7724yrs ago  . Alcohol Use: No   OB History    Gravida Para Term Preterm AB TAB SAB Ectopic Multiple Living   3 2 2  1 1    2      Review of Systems  Constitutional: Negative for fever and chills.  Respiratory: Negative.   Cardiovascular: Negative.   Gastrointestinal: Positive for nausea, vomiting (x1 only, yesterday), abdominal pain (reports LLQ pain), constipation and abdominal distention. Negative for blood in stool.  Genitourinary: Negative.   Musculoskeletal: Negative.   Skin: Negative.   Neurological: Negative.       Allergies  Bactrim; Ivp dye; Clonazepam; Iodine; Sulfa antibiotics; Codeine; and Vicodin  Home Medications   Prior to Admission medications   Medication Sig Start Date End Date Taking? Authorizing Provider  acetaminophen (TYLENOL) 500 MG tablet Take 500 mg by mouth at bedtime.    Historical Provider, MD  albuterol  (PROVENTIL HFA;VENTOLIN HFA) 108 (90 BASE) MCG/ACT inhaler Inhale into the lungs every 6 (six) hours as needed for wheezing or shortness of breath.    Historical Provider, MD  aspirin 81 MG tablet Take 81 mg by mouth daily. Reported on 10/21/2015    Historical Provider, MD  budesonide-formoterol (SYMBICORT) 80-4.5 MCG/ACT inhaler Inhale 2 puffs into the lungs 2 (two) times daily.    Historical Provider, MD  cetirizine (ZYRTEC) 10 MG tablet Take 10 mg by mouth daily.    Historical Provider, MD  clobetasol cream (TEMOVATE) 0.05 % Apply 1 application topically 2 (two) times daily. 11/20/15   Linna HoffJames D Kindl, MD  latanoprost (XALATAN) 0.005 % ophthalmic solution Place 1 drop into both eyes at bedtime.  07/31/14   Historical Provider, MD  linaclotide Karlene Einstein(LINZESS) 145 MCG CAPS capsule Take 1 capsule (145 mcg total) by mouth daily before breakfast. 11/29/15   Roma KayserKatherine P Schorr, NP  losartan-hydrochlorothiazide (HYZAAR) 100-12.5 MG tablet Take 0.5 tablets by mouth daily.  05/22/15   Historical Provider, MD  meloxicam (MOBIC) 15 MG tablet Take 15 mg by mouth daily. Reported on 10/21/2015    Historical Provider, MD  metFORMIN (GLUCOPHAGE) 500 MG tablet Take 500 mg by mouth 2 (two) times daily with a meal.    Historical Provider, MD  pravastatin (PRAVACHOL) 80 MG tablet Take 80 mg by mouth at bedtime.    Historical Provider, MD  zolpidem (AMBIEN) 10 MG tablet Take 10 mg by mouth at bedtime.     Historical Provider, MD   BP 139/85 mmHg  Pulse 81  Temp(Src) 97.6 F (36.4 C) (Oral)  Resp 16  Ht 5\' 3"  (1.6 m)  Wt 77.111 kg  BMI 30.12 kg/m2  SpO2 100% Physical Exam  Constitutional: She is oriented to person, place, and time. She appears well-developed and well-nourished.  HENT:  Head: Normocephalic.  Neck: Normal range of motion. Neck supple.  Cardiovascular: Normal rate and regular rhythm.   Pulmonary/Chest: Effort normal and breath sounds normal.  Abdominal: Soft. She exhibits no distension and no mass. There is  tenderness (Diffuse tenderness, greatest in the LLQ.). There is no rebound and no guarding.  Bowel sounds hypoactive.  Musculoskeletal: Normal range of motion.  Neurological: She is alert and oriented to person, place, and time.  Skin: Skin is warm and dry. No rash noted.  Psychiatric: She has a normal mood and affect.    ED Course  Procedures (including critical care time) Labs Review Labs Reviewed  CBC WITH DIFFERENTIAL/PLATELET  COMPREHENSIVE METABOLIC PANEL  URINALYSIS, ROUTINE W REFLEX MICROSCOPIC (NOT AT [facility])   Results for orders placed or performed during the hospital encounter of 12/06/15  CBC with Differential  Result Value Ref Range   WBC 7.0 4.0 - 10.5 K/uL  RBC 4.64 3.87 - 5.11 MIL/uL   Hemoglobin 12.8 12.0 - 15.0 g/dL   HCT 40.9 81.1 - 91.4 %   MCV 83.0 78.0 - 100.0 fL   MCH 27.6 26.0 - 34.0 pg   MCHC 33.2 30.0 - 36.0 g/dL   RDW 78.2 95.6 - 21.3 %   Platelets 279 150 - 400 K/uL   Neutrophils Relative % 43 %   Neutro Abs 3.0 1.7 - 7.7 K/uL   Lymphocytes Relative 46 %   Lymphs Abs 3.2 0.7 - 4.0 K/uL   Monocytes Relative 9 %   Monocytes Absolute 0.6 0.1 - 1.0 K/uL   Eosinophils Relative 3 %   Eosinophils Absolute 0.2 0.0 - 0.7 K/uL   Basophils Relative 1 %   Basophils Absolute 0.0 0.0 - 0.1 K/uL  Comprehensive metabolic panel  Result Value Ref Range   Sodium 135 135 - 145 mmol/L   Potassium 4.4 3.5 - 5.1 mmol/L   Chloride 104 101 - 111 mmol/L   CO2 21 (L) 22 - 32 mmol/L   Glucose, Bld 88 65 - 99 mg/dL   BUN 18 6 - 20 mg/dL   Creatinine, Ser 0.86 (H) 0.44 - 1.00 mg/dL   Calcium 9.6 8.9 - 57.8 mg/dL   Total Protein 7.0 6.5 - 8.1 g/dL   Albumin 4.0 3.5 - 5.0 g/dL   AST 17 15 - 41 U/L   ALT 11 (L) 14 - 54 U/L   Alkaline Phosphatase 86 38 - 126 U/L   Total Bilirubin 0.5 0.3 - 1.2 mg/dL   GFR calc non Af Amer 45 (L) >60 mL/min   GFR calc Af Amer 53 (L) >60 mL/min   Anion gap 10 5 - 15  Urinalysis, Routine w reflex microscopic  Result Value Ref Range    Color, Urine YELLOW YELLOW   APPearance CLEAR CLEAR   Specific Gravity, Urine 1.014 1.005 - 1.030   pH 5.0 5.0 - 8.0   Glucose, UA NEGATIVE NEGATIVE mg/dL   Hgb urine dipstick NEGATIVE NEGATIVE   Bilirubin Urine NEGATIVE NEGATIVE   Ketones, ur NEGATIVE NEGATIVE mg/dL   Protein, ur NEGATIVE NEGATIVE mg/dL   Nitrite NEGATIVE NEGATIVE   Leukocytes, UA NEGATIVE NEGATIVE   Ct Abdomen Pelvis Wo Contrast  12/07/2015  CLINICAL DATA:  Abdominal pain.  Constipation. EXAM: CT ABDOMEN AND PELVIS WITHOUT CONTRAST TECHNIQUE: Multidetector CT imaging of the abdomen and pelvis was performed following the standard protocol without IV contrast. COMPARISON:  None. FINDINGS: There is prior cholecystectomy. There are unremarkable unenhanced appearances of the liver and bile ducts. The pancreas, spleen, adrenals and kidneys are unremarkable. Ureters and urinary bladder appear normal. There is a hiatal hernia. Stomach and small bowel appear unremarkable. No evidence of appendicitis. There is colonic diverticulosis. Mild stranding opacity around the mid descending colon may represent minimal changes of diverticulitis. No bowel obstruction. No extraluminal air. There is a 3 cm calcified uterine fibroid, and several additional fibroids are suspected. Adnexal regions are unremarkable. The abdominal aorta is normal in caliber with mild atherosclerotic calcification. There is no significant abnormality in the lower chest. There is no significant musculoskeletal lesion. There is grade 1 spondylolisthesis at L4-5. IMPRESSION: 1. Diverticulosis. Mild stranding opacity around the mid descending colon may represent minimal changes of diverticulitis. No abscess. No extraluminal air. No bowel obstruction. 2. Uterine fibroids. 3. Hiatal hernia. Electronically Signed   By: Ellery Plunk M.D.   On: 12/07/2015 03:35    Imaging Review No results found. I have  personally reviewed and evaluated these images and lab results as part of my  medical decision-making.   EKG Interpretation None      MDM   Final diagnoses:  None    1. Mild diverticulitis  Patient presents for complaint of constipation reporting no significant bowel movement in several days despite use of laxatives, enemas and dietary changes. Afebrile, no N, V. She continues to eat.   She was seen on 11/27/15 for same. CT scan performed and shows mild diverticulitis. The patient remains comfortable throughout ED visit. She was seen by Dr. Elesa Massed and is found stable for discharge home. Will treat with Cipro/flagyl x 1 week and refer to GI for further outpatient evaluation. Recommend Miralax up to TID and Colace.     Elpidio Anis, PA-C 12/07/15 0425  Layla Maw Ward, DO 12/07/15 [phone]

## 2015-12-13 ENCOUNTER — Emergency Department (HOSPITAL_COMMUNITY)
Admission: EM | Admit: 2015-12-13 | Discharge: 2015-12-13 | Disposition: A | Discharge status: Home / Self Care | Payer: Medicare HMO | Attending: Emergency Medicine | Admitting: Emergency Medicine

## 2015-12-13 ENCOUNTER — Emergency Department (HOSPITAL_COMMUNITY): Payer: Medicare HMO

## 2015-12-13 ENCOUNTER — Encounter (HOSPITAL_COMMUNITY): Payer: Self-pay | Admitting: Emergency Medicine

## 2015-12-13 DIAGNOSIS — K5900 Constipation, unspecified: Secondary | ICD-10-CM | POA: Insufficient documentation

## 2015-12-13 DIAGNOSIS — J45909 Unspecified asthma, uncomplicated: Secondary | ICD-10-CM | POA: Insufficient documentation

## 2015-12-13 DIAGNOSIS — Z96652 Presence of left artificial knee joint: Secondary | ICD-10-CM | POA: Diagnosis not present

## 2015-12-13 DIAGNOSIS — Z7982 Long term (current) use of aspirin: Secondary | ICD-10-CM | POA: Insufficient documentation

## 2015-12-13 DIAGNOSIS — Z87891 Personal history of nicotine dependence: Secondary | ICD-10-CM | POA: Diagnosis not present

## 2015-12-13 DIAGNOSIS — I10 Essential (primary) hypertension: Secondary | ICD-10-CM | POA: Diagnosis not present

## 2015-12-13 DIAGNOSIS — E119 Type 2 diabetes mellitus without complications: Secondary | ICD-10-CM | POA: Insufficient documentation

## 2015-12-13 DIAGNOSIS — Z7984 Long term (current) use of oral hypoglycemic drugs: Secondary | ICD-10-CM | POA: Diagnosis not present

## 2015-12-13 DIAGNOSIS — F329 Major depressive disorder, single episode, unspecified: Secondary | ICD-10-CM | POA: Diagnosis not present

## 2015-12-13 IMAGING — CR DG ABDOMEN ACUTE W/ 1V CHEST
3 series · 3 of 3 positions shown · non-contrast
Comparison: CT [DATE]

CLINICAL DATA: Constipation 1 week.

EXAM:
DG ABDOMEN ACUTE W/ 1V CHEST

[chest pa]
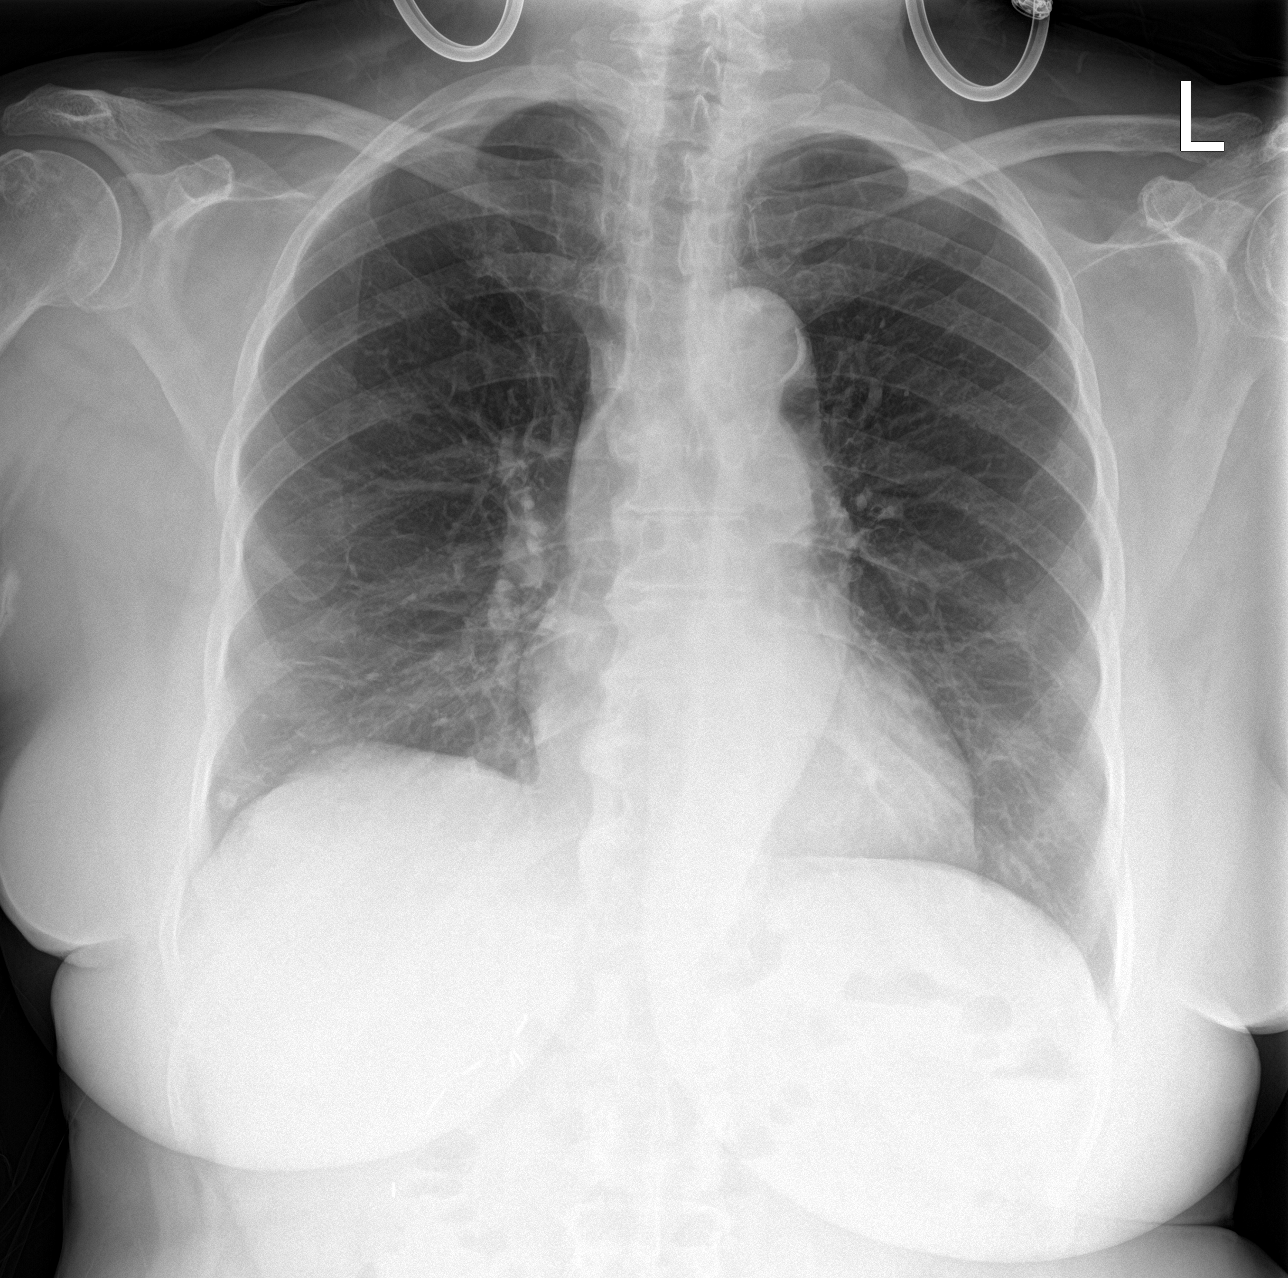

[abdomen erect]
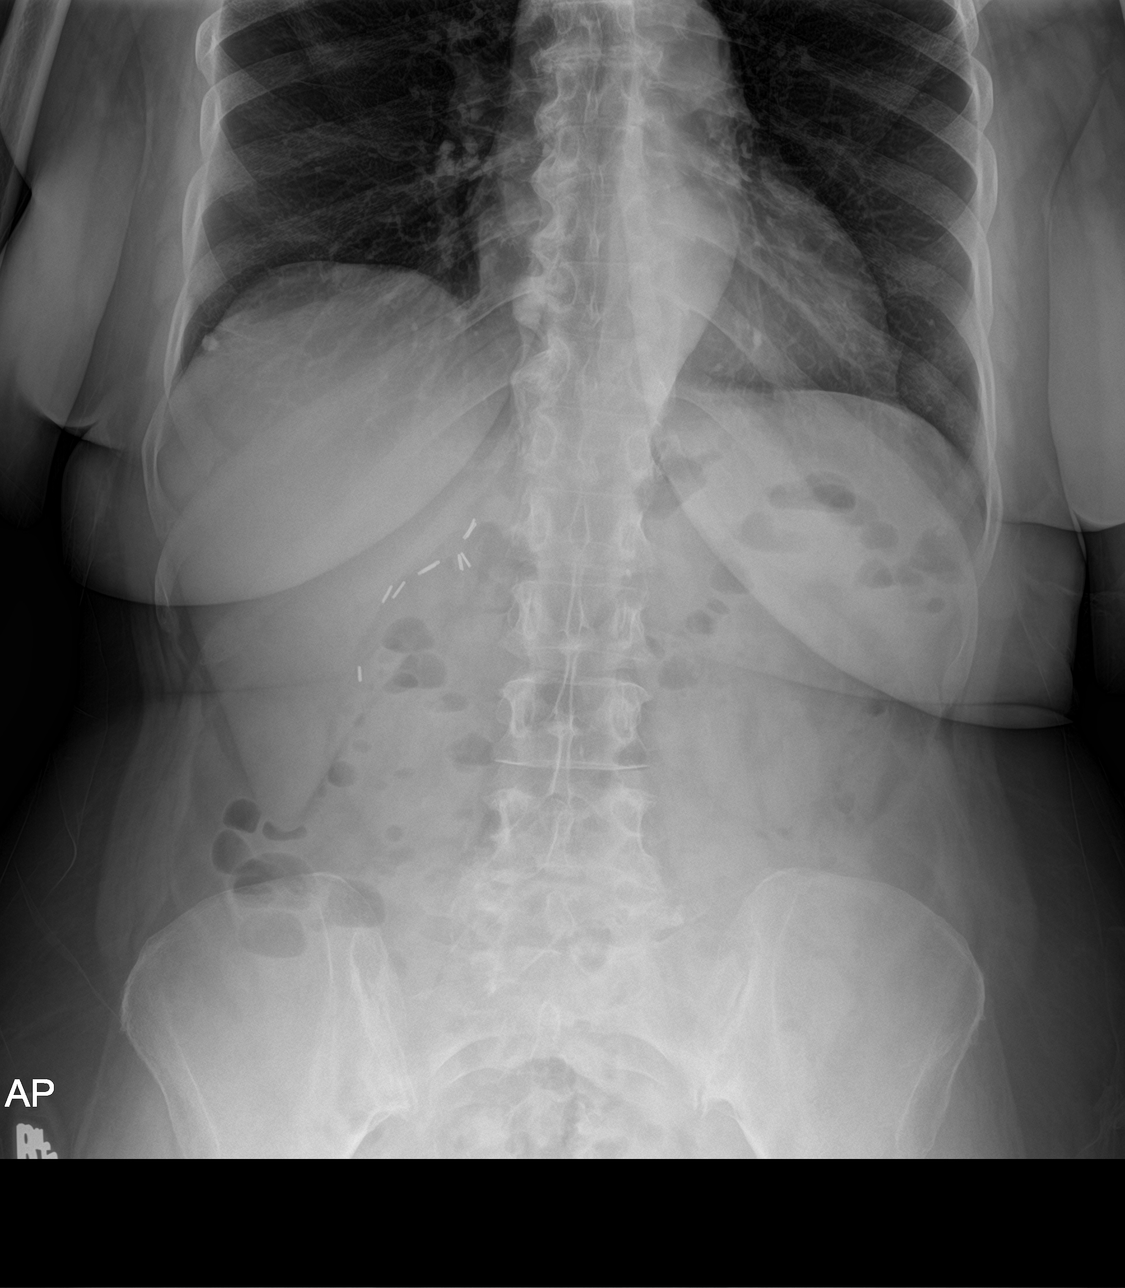

[abdomen supine]
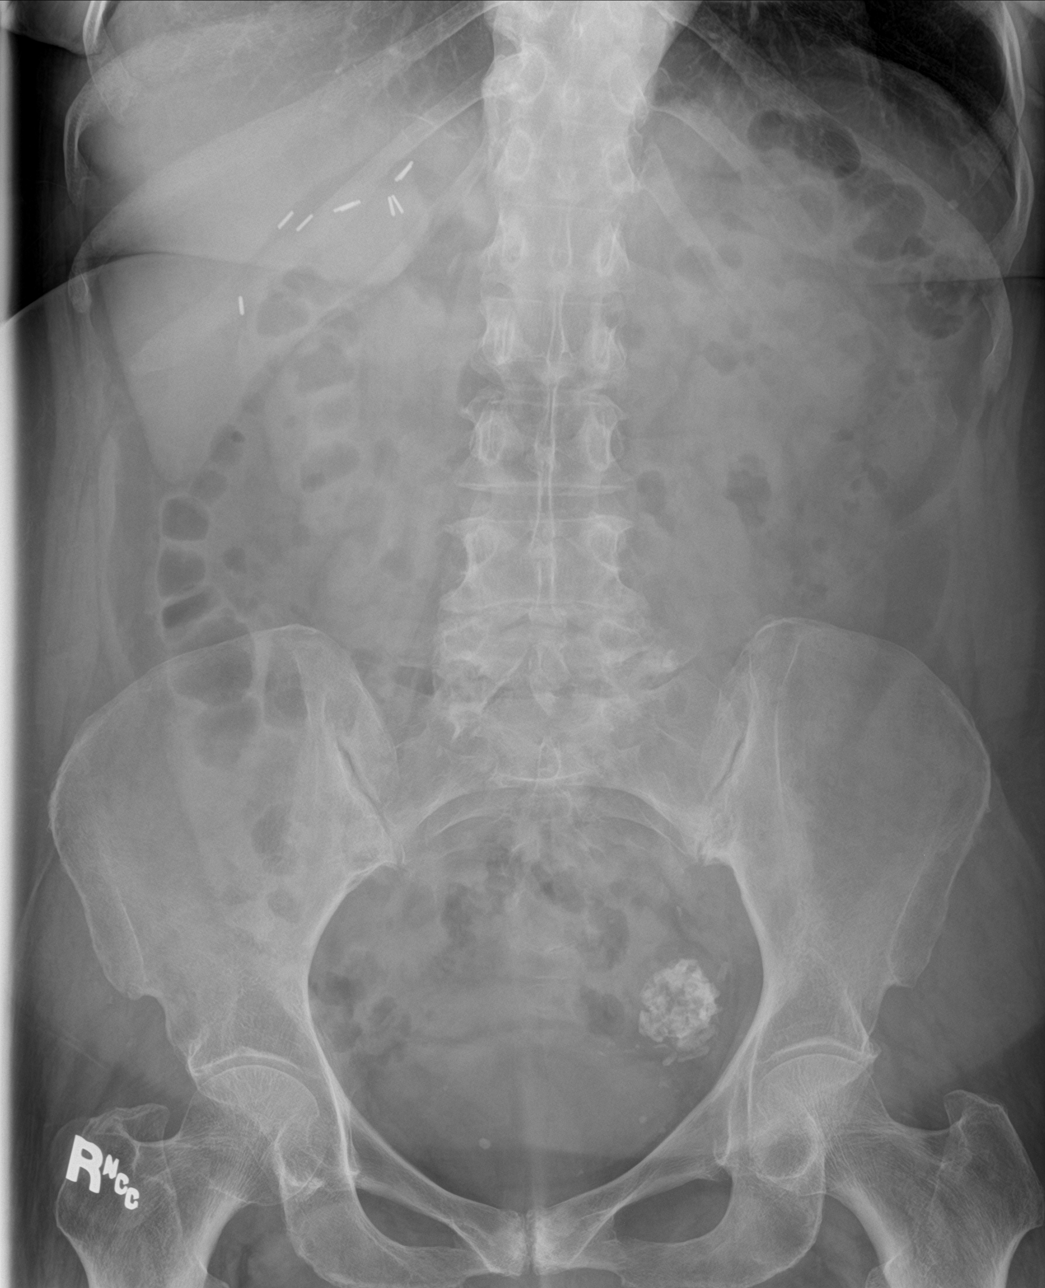

[3 of 3 positions shown; findings below may reference images not displayed]

FINDINGS: Lungs are adequately inflated without consolidation or effusion.
Calcified granuloma over the right base. Cardiomediastinal
silhouette is within normal. There is calcified plaque over the
aortic arch. Degenerative changes of the spine.

Bowel gas pattern is nonobstructive. There is spur few scattered
air-fluid levels over the colon. No free peritoneal air. Surgical
clips over the right upper quadrant. Calcified uterine fibroid over
the left pelvis. There are degenerative changes of the spine,
sacroiliac joints, symphysis pubis joint and hips.
IMPRESSION: Nonobstructive bowel gas pattern.

No acute cardiopulmonary disease.

## 2015-12-13 NOTE — ED Notes (Signed)
C/o constipation since 6/18.  States she has been seen in ED for same.

## 2015-12-13 NOTE — Discharge Instructions (Signed)
High-Fiber Diet  Fiber, also called dietary fiber, is a type of carbohydrate found in fruits, vegetables, whole grains, and beans. A high-fiber diet can have many health benefits. Your health care provider may recommend a high-fiber diet to help:  · Prevent constipation. Fiber can make your bowel movements more regular.  · Lower your cholesterol.  · Relieve hemorrhoids, uncomplicated diverticulosis, or irritable bowel syndrome.  · Prevent overeating as part of a weight-loss plan.  · Prevent heart disease, type 2 diabetes, and certain cancers.  WHAT IS MY PLAN?  The recommended daily intake of fiber includes:  · 38 grams for men under age 50.  · 30 grams for men over age 50.  · 25 grams for women under age 50.  · 21 grams for women over age 50.  You can get the recommended daily intake of dietary fiber by eating a variety of fruits, vegetables, grains, and beans. Your health care provider may also recommend a fiber supplement if it is not possible to get enough fiber through your diet.  WHAT DO I NEED TO KNOW ABOUT A HIGH-FIBER DIET?  · Fiber supplements have not been widely studied for their effectiveness, so it is better to get fiber through food sources.  · Always check the fiber content on the nutrition facts label of any prepackaged food. Look for foods that contain at least 5 grams of fiber per serving.  · Ask your dietitian if you have questions about specific foods that are related to your condition, especially if those foods are not listed in the following section.  · Increase your daily fiber consumption gradually. Increasing your intake of dietary fiber too quickly may cause bloating, cramping, or gas.  · Drink plenty of water. Water helps you to digest fiber.  WHAT FOODS CAN I EAT?  Grains  Whole-grain breads. Multigrain cereal. Oats and oatmeal. Brown rice. Barley. Bulgur wheat. Millet. Bran muffins. Popcorn. Rye wafer crackers.  Vegetables  Sweet potatoes. Spinach. Kale. Artichokes. Cabbage. Broccoli.  Green peas. Carrots. Squash.  Fruits  Berries. Pears. Apples. Oranges. Avocados. Prunes and raisins. Dried figs.  Meats and Other Protein Sources  Navy, kidney, pinto, and soy beans. Split peas. Lentils. Nuts and seeds.  Dairy  Fiber-fortified yogurt.  Beverages  Fiber-fortified soy milk. Fiber-fortified orange juice.  Other  Fiber bars.  The items listed above may not be a complete list of recommended foods or beverages. Contact your dietitian for more options.  WHAT FOODS ARE NOT RECOMMENDED?  Grains  White bread. Pasta made with refined flour. White rice.  Vegetables  Fried potatoes. Canned vegetables. Well-cooked vegetables.   Fruits  Fruit juice. Cooked, strained fruit.  Meats and Other Protein Sources  Fatty cuts of meat. Fried poultry or fried fish.  Dairy  Milk. Yogurt. Cream cheese. Sour cream.  Beverages  Soft drinks.  Other  Cakes and pastries. Butter and oils.  The items listed above may not be a complete list of foods and beverages to avoid. Contact your dietitian for more information.  WHAT ARE SOME TIPS FOR INCLUDING HIGH-FIBER FOODS IN MY DIET?  · Eat a wide variety of high-fiber foods.  · Make sure that half of all grains consumed each day are whole grains.  · Replace breads and cereals made from refined flour or white flour with whole-grain breads and cereals.  · Replace white rice with brown rice, bulgur wheat, or millet.  · Start the day with a breakfast that is high in fiber, such as a   cereal that contains at least 5 grams of fiber per serving.  · Use beans in place of meat in soups, salads, or pasta.  · Eat high-fiber snacks, such as berries, raw vegetables, nuts, or popcorn.     This information is not intended to replace advice given to you by your health care provider. Make sure you discuss any questions you have with your health care provider.     Document Released: 05/30/2005 Document Revised: 06/20/2014 Document Reviewed: 11/12/2013  Elsevier Interactive Patient Education ©2016 [employer]  [employer]

## 2015-12-13 NOTE — ED Notes (Signed)
Pt stable, ambulatory, states understanding of discharge instructions 

## 2015-12-13 NOTE — ED Provider Notes (Signed)
CSN: [id number]     Arrival date & time 12/13/15  1624 History   First MD Initiated Contact with Patient 12/13/15 1646     Chief Complaint  Patient presents with  . Constipation    PT IS A VERY PLEASANT 70 YO BF WITH A HX OF CONSTIPATION.  SHE PRESENTS TODAY WITH CONSTIPATION.  PT SAID THAT SHE HAS BEEN TO THE ED FOR THIS PROBLEM AND NOTHING IS HELPING.  SHE HAS AN APPT WITH GI ON 7/7.  THE PT WAS HERE LAST ON 6/25 AND WAS DX'D WITH DIVERTICULITIS.  PT SAID SHE TOOK HER MEDS.  PT SAID THAT SHE FEELS NAUSEOUS, BUT IS NOT VOMITING.   (Consider location/radiation/quality/duration/timing/severity/associated sxs/prior Treatment) Patient is a 70 y.o. female presenting with constipation. The history is provided by the patient.  Constipation Severity:  Moderate Progression:  Unchanged Chronicity:  Recurrent Stool description:  Loose and watery Relieved by:  Nothing Ineffective treatments:  Enemas, laxatives, Miralax, stool softeners and fiber Associated symptoms: nausea     Past Medical History  Diagnosis Date  . Insomnia     takes Ambien nightly   . Glaucoma     uses eye drops daily  . Pulmonary nodule   . HLD (hyperlipidemia)     takes Pravastatin daily  . Osteoarthritis     knees  . Primary localized osteoarthritis of left knee   . Diabetes mellitus without complication (HCC)     takes Metformin daily  . Muscle spasm     takes Baclofen daily as needed  . Complication of anesthesia     slow to wake up  . Cough     nonproductive and without fever  . History of bronchitis a yr ago  . Joint pain   . Joint swelling   . Chronic back pain   . Eczema   . Urinary frequency   . Urinary urgency   . Depression     but doesn't take any meds   . Hypertension     takes Losartan-HCTZ daily  . Asthma     uses Symbicort daily and ALbuterol as needed  . GERD (gastroesophageal reflux disease)     doesn't take any meds  . Constipation    Past Surgical History  Procedure Laterality  Date  . Gallbladder surgery  1997  . Total knee arthroplasty Left 01/05/2015    Procedure: TOTAL KNEE ARTHROPLASTY;  Surgeon: Salvatore Marvelobert Wainer, MD;  [facility];  Service: Orthopedics;  Laterality: Left;   Family History  Problem Relation Age of Onset  . Heart attack Father   . Glaucoma Mother   . Diabetes Mellitus II Mother   . Hypertension Sister   . Cancer Maternal Grandmother   . Colon cancer Maternal Grandmother    Social History  Substance Use Topics  . Smoking status: Former Games developermoker  . Smokeless tobacco: Never Used     Comment: quit smoking 5235yrs ago  . Alcohol Use: No   OB History    Gravida Para Term Preterm AB TAB SAB Ectopic Multiple Living   3 2 2  1 1    2      Review of Systems  Gastrointestinal: Positive for nausea and constipation.  All other systems reviewed and are negative.     Allergies  Bactrim; Ivp dye; Clonazepam; Iodine; Sulfa antibiotics; Codeine; and Vicodin  Home Medications   Prior to Admission medications   Medication Sig Start Date End Date Taking? Authorizing Provider  acetaminophen (TYLENOL) 500 MG tablet Take  500 mg by mouth at bedtime.    Historical Provider, MD  albuterol (PROVENTIL HFA;VENTOLIN HFA) 108 (90 BASE) MCG/ACT inhaler Inhale into the lungs every 6 (six) hours as needed for wheezing or shortness of breath.    Historical Provider, MD  aspirin 81 MG tablet Take 81 mg by mouth daily. Reported on 10/21/2015    Historical Provider, MD  budesonide-formoterol (SYMBICORT) 80-4.5 MCG/ACT inhaler Inhale 2 puffs into the lungs 2 (two) times daily.    Historical Provider, MD  cetirizine (ZYRTEC) 10 MG tablet Take 10 mg by mouth daily.    Historical Provider, MD  ciprofloxacin (CIPRO) 500 MG tablet Take 1 tablet (500 mg total) by mouth 2 (two) times daily. 12/07/15   Elpidio Anis, PA-C  clobetasol cream (TEMOVATE) 0.05 % Apply 1 application topically 2 (two) times daily. 11/20/15   Linna Hoff, MD  docusate sodium (COLACE) 100 MG capsule  Take 1 capsule (100 mg total) by mouth every 12 (twelve) hours. 12/07/15   Elpidio Anis, PA-C  latanoprost (XALATAN) 0.005 % ophthalmic solution Place 1 drop into both eyes at bedtime.  07/31/14   Historical Provider, MD  linaclotide Karlene Einstein) 145 MCG CAPS capsule Take 1 capsule (145 mcg total) by mouth daily before breakfast. 11/29/15   Roma Kayser Schorr, NP  losartan-hydrochlorothiazide (HYZAAR) 100-12.5 MG tablet Take 0.5 tablets by mouth daily.  05/22/15   Historical Provider, MD  meloxicam (MOBIC) 15 MG tablet Take 15 mg by mouth daily. Reported on 10/21/2015    Historical Provider, MD  metFORMIN (GLUCOPHAGE) 500 MG tablet Take 500 mg by mouth 2 (two) times daily with a meal.    Historical Provider, MD  metroNIDAZOLE (FLAGYL) 500 MG tablet Take 1 tablet (500 mg total) by mouth 3 (three) times daily. 12/07/15   Elpidio Anis, PA-C  polyethylene glycol [facility]) packet Use up to three times daily, maximum 3 consecutive days 12/07/15   Elpidio Anis, PA-C  pravastatin (PRAVACHOL) 80 MG tablet Take 80 mg by mouth at bedtime.    Historical Provider, MD  zolpidem (AMBIEN) 10 MG tablet Take 10 mg by mouth at bedtime.     Historical Provider, MD   BP 137/98 mmHg  Pulse 86  Temp(Src) 98.4 F (36.9 C) (Oral)  Resp 18  SpO2 100% Physical Exam  Constitutional: She is oriented to person, place, and time. She appears well-developed and well-nourished.  HENT:  Head: Normocephalic and atraumatic.  Right Ear: External ear normal.  Left Ear: External ear normal.  Nose: Nose normal.  Mouth/Throat: Oropharynx is clear and moist.  Eyes: Conjunctivae and EOM are normal. Pupils are equal, round, and reactive to light.  Neck: Normal range of motion. Neck supple.  Cardiovascular: Normal rate, regular rhythm, normal heart sounds and intact distal pulses.   Pulmonary/Chest: Effort normal and breath sounds normal.  Abdominal: Soft. Bowel sounds are decreased.  Musculoskeletal: Normal range of motion.   Neurological: She is alert and oriented to person, place, and time.  Skin: Skin is warm and dry.  Psychiatric: She has a normal mood and affect. Her behavior is normal. Judgment and thought content normal.  Nursing note and vitals reviewed.   ED Course  Procedures (including critical care time) Labs Review Labs Reviewed - No data to display  Imaging Review Dg Abd Acute W/chest  12/13/2015  CLINICAL DATA:  Constipation 1 week. EXAM: DG ABDOMEN ACUTE W/ 1V CHEST COMPARISON:  CT 12/07/2015 FINDINGS: Lungs are adequately inflated without consolidation or effusion. Calcified granuloma over the right  base. Cardiomediastinal silhouette is within normal. There is calcified plaque over the aortic arch. Degenerative changes of the spine. Bowel gas pattern is nonobstructive. There is spur few scattered air-fluid levels over the colon. No free peritoneal air. Surgical clips over the right upper quadrant. Calcified uterine fibroid over the left pelvis. There are degenerative changes of the spine, sacroiliac joints, symphysis pubis joint and hips. IMPRESSION: Nonobstructive bowel gas pattern. No acute cardiopulmonary disease. Electronically Signed   By: Elberta Fortisaniel  Boyle M.D.   On: 12/13/2015 17:40   I have personally reviewed and evaluated these images and lab results as part of my medical decision-making.   EKG Interpretation None      MDM  PT DOES NOT HAVE A LARGE AMOUNT OF STOOL ON XR.  SHE HAS BEEN TAKING A LOT OF MIRALAX, EX-LAX, COLACE AND MGCI.  I TOLD HER TO STOP ALL THAT AS I THINK THAT IS CAUSING THE ABD CRAMPING.  I DON'T THINK PT NEEDS ANOTHER CT TO EVAL DIVERTICULITIS SEEN ON 6/26 AS HER ABD EXAM WAS VERY BENIGN.  I ENCOURAGED PT TO EAT 4-5 SMALL MEALS A DAY HIGH IN FIBER TO HELP GOOD BOWEL HEALTH.  PT KNOWS TO RETURN IF WORSE.  Final diagnoses:  Constipation, unspecified constipation type        Jacalyn LefevreJulie Darrah Dredge, MD 12/13/15 1801

## 2015-12-16 ENCOUNTER — Inpatient Hospital Stay (HOSPITAL_COMMUNITY): Admission: RE | Admit: 2015-12-16 | Payer: Medicare HMO | Source: Ambulatory Visit

## 2015-12-17 ENCOUNTER — Encounter (HOSPITAL_COMMUNITY)
Admission: RE | Admit: 2015-12-17 | Discharge: 2015-12-17 | Disposition: A | Discharge status: Home / Self Care | Payer: Medicare HMO | Source: Ambulatory Visit | Attending: Orthopedic Surgery | Admitting: Orthopedic Surgery

## 2015-12-17 ENCOUNTER — Encounter (HOSPITAL_COMMUNITY): Payer: Self-pay | Admitting: Physician Assistant

## 2015-12-17 ENCOUNTER — Encounter (HOSPITAL_COMMUNITY): Payer: Self-pay

## 2015-12-17 ENCOUNTER — Other Ambulatory Visit (HOSPITAL_COMMUNITY): Payer: Self-pay | Admitting: *Deleted

## 2015-12-17 DIAGNOSIS — Z0183 Encounter for blood typing: Secondary | ICD-10-CM | POA: Diagnosis not present

## 2015-12-17 DIAGNOSIS — M1711 Unilateral primary osteoarthritis, right knee: Secondary | ICD-10-CM | POA: Diagnosis present

## 2015-12-17 DIAGNOSIS — M179 Osteoarthritis of knee, unspecified: Secondary | ICD-10-CM | POA: Diagnosis not present

## 2015-12-17 DIAGNOSIS — Z01812 Encounter for preprocedural laboratory examination: Secondary | ICD-10-CM | POA: Diagnosis not present

## 2015-12-17 DIAGNOSIS — L253 Unspecified contact dermatitis due to other chemical products: Secondary | ICD-10-CM | POA: Diagnosis present

## 2015-12-17 HISTORY — DX: Cardiac murmur, unspecified: R01.1

## 2015-12-17 HISTORY — DX: Anemia, unspecified: D64.9

## 2015-12-17 HISTORY — DX: Unilateral primary osteoarthritis, right knee: M17.11

## 2015-12-17 LAB — COMPREHENSIVE METABOLIC PANEL
ALT: 12 U/L — ABNORMAL LOW (ref 14–54)
ANION GAP: 7 (ref 5–15)
AST: 17 U/L (ref 15–41)
Albumin: 3.8 g/dL (ref 3.5–5.0)
Alkaline Phosphatase: 92 U/L (ref 38–126)
BUN: 17 mg/dL (ref 6–20)
CHLORIDE: 110 mmol/L (ref 101–111)
CO2: 21 mmol/L — AB (ref 22–32)
Calcium: 9.4 mg/dL (ref 8.9–10.3)
Creatinine, Ser: 1.04 mg/dL — ABNORMAL HIGH (ref 0.44–1.00)
GFR calc non Af Amer: 54 mL/min — ABNORMAL LOW (ref >60)
Glucose, Bld: 89 mg/dL (ref 65–99)
Potassium: 4.4 mmol/L (ref 3.5–5.1)
SODIUM: 138 mmol/L (ref 135–145)
Total Bilirubin: 0.6 mg/dL (ref 0.3–1.2)
Total Protein: 7 g/dL (ref 6.5–8.1)

## 2015-12-17 LAB — TYPE AND SCREEN
ABO/RH(D): A NEG
Antibody Screen: NEGATIVE

## 2015-12-17 LAB — APTT: APTT: 31 s (ref 24–37)

## 2015-12-17 LAB — CBC WITH DIFFERENTIAL/PLATELET
Basophils Absolute: 0 10*3/uL (ref 0.0–0.1)
Basophils Relative: 1 %
EOS ABS: 0.1 10*3/uL (ref 0.0–0.7)
EOS PCT: 2 %
HCT: 36.3 % (ref 36.0–46.0)
Hemoglobin: 12.1 g/dL (ref 12.0–15.0)
LYMPHS ABS: 2.6 10*3/uL (ref 0.7–4.0)
Lymphocytes Relative: 42 %
MCH: 27.8 pg (ref 26.0–34.0)
MCHC: 33.3 g/dL (ref 30.0–36.0)
MCV: 83.4 fL (ref 78.0–100.0)
MONOS PCT: 6 %
Monocytes Absolute: 0.4 10*3/uL (ref 0.1–1.0)
Neutro Abs: 3.1 10*3/uL (ref 1.7–7.7)
Neutrophils Relative %: 49 %
PLATELETS: 357 10*3/uL (ref 150–400)
RBC: 4.35 MIL/uL (ref 3.87–5.11)
RDW: 14.5 % (ref 11.5–15.5)
WBC: 6.2 10*3/uL (ref 4.0–10.5)

## 2015-12-17 LAB — GLUCOSE, CAPILLARY: Glucose-Capillary: 110 mg/dL — ABNORMAL HIGH (ref 65–99)

## 2015-12-17 LAB — PROTIME-INR
INR: 1.03 (ref 0.00–1.49)
Prothrombin Time: 13.7 seconds (ref 11.6–15.2)

## 2015-12-17 LAB — SURGICAL PCR SCREEN
MRSA, PCR: NEGATIVE
STAPHYLOCOCCUS AUREUS: NEGATIVE

## 2015-12-17 NOTE — Pre-Procedure Instructions (Signed)
Brooke Davidson  12/17/2015     Your procedure is scheduled on Monday, December 28, 2015 at 9:00 AM.   Report [facility] "[facility]" Admitting Office at 7:00 AM.   Call this number if you have problems the morning of surgery: [phone]   Any questions prior to day of surgery, please call [phone] between 8 & 4 PM.   Remember:  Do not eat food or drink liquids after midnight Sunday, 12/27/15.  Take these medicines the morning of surgery with A SIP OF WATER: Cetirizine (Zyrtec), Symbicort inhaler, Albuterol inhaler - if needed (bring this inhaler with you day of surgery.  Stop Aspirin and NSAIDS (Mobic, Meloxicam, Ibuprofen, Aleve, etc.) 7 days prior to surgery.    How to Manage Your Diabetes Before Surgery   Why is it important to control my blood sugar before and after surgery?   Improving blood sugar levels before and after surgery helps healing and can limit problems.  A way of improving blood sugar control is eating a healthy diet by:  - Eating less sugar and carbohydrates  - Increasing activity/exercise  - Talk with your doctor about reaching your blood sugar goals  High blood sugars (greater than 180 mg/dL) can raise your risk of infections and slow down your recovery so you will need to focus on controlling your diabetes during the weeks before surgery.  Make sure that the doctor who takes care of your diabetes knows about your planned surgery including the date and location.  How do I manage my blood sugars before surgery?   Check your blood sugar at least 4 times a day, 2 days before surgery to make sure that they are not too high or low.  Check your blood sugar the morning of your surgery when you wake up and every 2 hours until you get to the Short-Stay unit.  Treat a low blood sugar (less than 70 mg/dL) with 1/2 cup of clear juice (cranberry or apple), 4 glucose tablets, OR glucose gel.  Recheck blood sugar in 15 minutes after treatment (to  make sure it is greater than 70 mg/dL).  If blood sugar is not greater than 70 mg/dL on re-check, call [phone] for further instructions.   Report your blood sugar to the Short-Stay nurse when you get to Short-Stay.  References:  [facility], 2007 "How to Manage your Diabetes Before and After Surgery".  What do I do about my diabetes medications?   Do not take oral diabetes medicines (pills) the morning of surgery.   Do not wear jewelry, make-up or nail polish.  Do not wear lotions, powders, or perfumes.  You may wear deoderant.  Do not shave 48 hours prior to surgery.  Men may shave face and neck.  Do not bring valuables to the hospital.  [facility] is not responsible for any belongings or valuables.  Contacts, dentures or bridgework may not be worn into surgery.  Leave your suitcase in the car.  After surgery it may be brought to your room.  For patients admitted to the hospital, discharge time will be determined by your treatment team.  Special instructions:  [facility] - Preparing for Surgery  Before surgery, you can play an important role.  Because skin is not sterile, your skin needs to be as free of germs as possible.  You can reduce the number of germs on you skin by washing with CHG (chlorahexidine gluconate) soap before surgery.  CHG  is an antiseptic cleaner which kills germs and bonds with the skin to continue killing germs even after washing.  Please DO NOT use if you have an allergy to CHG or antibacterial soaps.  If your skin becomes reddened/irritated stop using the CHG and inform your nurse when you arrive at Short Stay.  Do not shave (including legs and underarms) for at least 48 hours prior to the first CHG shower.  You may shave your face.  Please follow these instructions carefully:   1.  Shower with CHG Soap the night before surgery and the                                morning of Surgery.  2.  If you choose to wash your hair,  wash your hair first as usual with your       normal shampoo.  3.  After you shampoo, rinse your hair and body thoroughly to remove the                      Shampoo.  4.  Use CHG as you would any other liquid soap.  You can apply chg directly       to the skin and wash gently with scrungie or a clean washcloth.  5.  Apply the CHG Soap to your body ONLY FROM THE NECK DOWN.        Do not use on open wounds or open sores.  Avoid contact with your eyes, ears, mouth and genitals (private parts).  Wash genitals (private parts) with your normal soap.  6.  Wash thoroughly, paying special attention to the area where your surgery        will be performed.  7.  Thoroughly rinse your body with warm water from the neck down.  8.  DO NOT shower/wash with your normal soap after using and rinsing off       the CHG Soap.  9.  Pat yourself dry with a clean towel.            10.  Wear clean pajamas.            11.  Place clean sheets on your bed the night of your first shower and do not        sleep with pets.  Day of Surgery  Do not apply any lotions the morning of surgery.  Please wear clean clothes to the hospital.   Please read over the following fact sheets that you were given. MRSA Information

## 2015-12-17 NOTE — Progress Notes (Signed)
Pt states the rash she had back in June has gone. Was seen by dermatologist and they think it was due to Tide Powder Detergent. Pt denies any cardiac history, chest pain or sob. Pt is diabetic, last A1C was 5.7 on 11/20/15. States fasting blood sugar is usually around 110.

## 2015-12-17 NOTE — H&P (Signed)
TOTAL KNEE ADMISSION H&P  Patient is being admitted for right total knee arthroplasty.  Subjective:  Chief Complaint:right knee pain.  HPI: Brooke Davidson, 70 y.o. female, has a history of pain and functional disability in the right knee due to arthritis and has failed non-surgical conservative treatments for greater than 12 weeks to includeNSAID's and/or analgesics, corticosteriod injections, viscosupplementation injections, flexibility and strengthening excercises, supervised PT with diminished ADL's post treatment, weight reduction as appropriate and activity modification.  Onset of symptoms was gradual, starting 10 years ago with gradually worsening course since that time. The patient noted prior procedures on the knee to include  arthroscopy and menisectomy on the right knee(s).  Patient currently rates pain in the right knee(s) at 10 out of 10 with activity. Patient has night pain, worsening of pain with activity and weight bearing, pain that interferes with activities of daily living, crepitus and joint swelling.  Patient has evidence of subchondral sclerosis, periarticular osteophytes and joint space narrowing by imaging studies.  There is no active infection.  Patient Active Problem List   Diagnosis Date Noted  . Primary localized osteoarthritis of right knee 12/17/2015  . Contact dermatitis due to chemicals   . DJD (degenerative joint disease) of knee 01/05/2015  . Primary localized osteoarthritis of left knee   . Osteoarthritis   . Schizoaffective disorder (HCC)   . Diabetes mellitus without complication (HCC)   . Hypertension   . HLD (hyperlipidemia)   . Asthma   . Pulmonary nodule   . Glaucoma   . Insomnia   . Well woman exam with routine gynecological exam 08/04/2014   Past Medical History  Diagnosis Date  . Insomnia     takes Ambien nightly   . Glaucoma     uses eye drops daily  . Pulmonary nodule   . HLD (hyperlipidemia)     takes Pravastatin daily  .  Osteoarthritis     knees  . Primary localized osteoarthritis of left knee   . Diabetes mellitus without complication (HCC)     takes Metformin daily  . Muscle spasm     takes Baclofen daily as needed  . Complication of anesthesia     slow to wake up  . Cough     nonproductive and without fever  . History of bronchitis a yr ago  . Joint pain   . Joint swelling   . Chronic back pain   . Eczema   . Urinary frequency   . Urinary urgency   . Depression     but doesn't take any meds   . Hypertension     takes Losartan-HCTZ daily  . Asthma     uses Symbicort daily and ALbuterol as needed  . GERD (gastroesophageal reflux disease)     doesn't take any meds  . Constipation   . Contact dermatitis due to chemicals   . Primary localized osteoarthritis of right knee 12/17/2015    Past Surgical History  Procedure Laterality Date  . Gallbladder surgery  1997  . Total knee arthroplasty Left 01/05/2015    Procedure: TOTAL KNEE ARTHROPLASTY;  Surgeon: Salvatore Marvelobert Wainer, MD;  [facility];  Service: Orthopedics;  Laterality: Left;    No prescriptions prior to admission   Allergies  Allergen Reactions  . Bactrim [Sulfamethoxazole-Trimethoprim] Swelling    Tongue swells  . Ivp Dye [Iodinated Diagnostic Agents] Hives  . Clonazepam     dizziness  . Iodine Hives  . Sulfa Antibiotics Swelling  . Codeine Itching  .  Other Itching and Rash    Perfume  . Vicodin [Hydrocodone-Acetaminophen] Itching    Social History  Substance Use Topics  . Smoking status: Former Games developer  . Smokeless tobacco: Never Used     Comment: quit smoking 105yrs ago  . Alcohol Use: No    Family History  Problem Relation Age of Onset  . Heart attack Father   . Glaucoma Mother   . Diabetes Mellitus II Mother   . Hypertension Sister   . Cancer Maternal Grandmother   . Colon cancer Maternal Grandmother      Review of Systems  Constitutional: Negative.   HENT: Negative.   Eyes: Negative.   Respiratory: Negative.    Cardiovascular: Negative.   Gastrointestinal: Negative.   Genitourinary: Negative.   Musculoskeletal: Positive for back pain and joint pain.  Skin: Negative.   Neurological: Negative.   Endo/Heme/Allergies: Negative.   Psychiatric/Behavioral: Negative.     Objective:  Physical Exam  Constitutional: She is oriented to person, place, and time. She appears well-developed and well-nourished.  HENT:  Head: Normocephalic and atraumatic.  Mouth/Throat: Oropharynx is clear and moist.  Eyes: Conjunctivae are normal. Pupils are equal, round, and reactive to light.  Cardiovascular: Normal rate and regular rhythm.   Respiratory: Effort normal and breath sounds normal.  GI: Soft. Bowel sounds are normal.  Genitourinary:  Not pertinent to current symptomatology therefore not examined.  Musculoskeletal:  Examination of her right knee reveals 1-2+ effusion.  Diffuse pain.  Moderate varus deformity.  Range of motion 0-90 degrees.  Knee is stable.  Examination of her left knee reveals well healed total knee incision without swelling or pain.  Full range of motion.  Knee is stable with normal patella tracking.    Neurological: She is alert and oriented to person, place, and time.  Skin: Skin is warm and dry.  Psychiatric: She has a normal mood and affect. Her behavior is normal.    Vital signs in last 24 hours: Temp:  [97.8 F (36.6 C)] 97.8 F (36.6 C) (07/06 0800) Pulse Rate:  [71] 71 (07/06 0800) BP: (133)/(71) 133/71 mmHg (07/06 0800) SpO2:  [98 %] 98 % (07/06 0800) Weight:  [77.111 kg (170 lb)] 77.111 kg (170 lb) (07/06 0800)  Labs:   Estimated body mass index is 29.17 kg/(m^2) as calculated from the following:   Height as of this encounter:  (1.626 m).   Weight as of this encounter: 77.111 kg (170 lb).   Imaging Review Plain radiographs demonstrate severe degenerative joint disease of the right knee(s). The overall alignment issignificant varus. The bone quality appears to  be good for age and reported activity level.  Assessment/Plan:  End stage arthritis, right knee  Principal Problem:   Primary localized osteoarthritis of right knee Active Problems:   Schizoaffective disorder (HCC)   Diabetes mellitus without complication (HCC)   Hypertension   HLD (hyperlipidemia)   Asthma   Pulmonary nodule   Glaucoma   Insomnia   Contact dermatitis due to chemicals  The patient history, physical examination, clinical judgment of the provider and imaging studies are consistent with end stage degenerative joint disease of the right knee(s) and total knee arthroplasty is deemed medically necessary. The treatment options including medical management, injection therapy arthroscopy and arthroplasty were discussed at length. The risks and benefits of total knee arthroplasty were presented and reviewed. The risks due to aseptic loosening, infection, stiffness, patella tracking problems, thromboembolic complications and other imponderables were discussed. The patient acknowledged the explanation,  agreed to proceed with the plan and consent was signed. Patient is being admitted for inpatient treatment for surgery, pain control, PT, OT, prophylactic antibiotics, VTE prophylaxis, progressive ambulation and ADL's and discharge planning. The patient [facility]

## 2015-12-18 ENCOUNTER — Encounter: Payer: Self-pay | Admitting: Gastroenterology

## 2015-12-18 ENCOUNTER — Ambulatory Visit (INDEPENDENT_AMBULATORY_CARE_PROVIDER_SITE_OTHER): Payer: Medicare HMO | Admitting: Gastroenterology

## 2015-12-18 VITALS — BP 120/70 | HR 66 | Ht 64.0 in | Wt 172.6 lb

## 2015-12-18 DIAGNOSIS — K5732 Diverticulitis of large intestine without perforation or abscess without bleeding: Secondary | ICD-10-CM

## 2015-12-18 LAB — URINE CULTURE: CULTURE: NO GROWTH

## 2015-12-18 MED ORDER — HYDROCORTISONE 2.5 % RE CREA: 1.0000 "application " | TOPICAL_CREAM | Freq: Two times a day (BID) | RECTAL | Status: DC

## 2015-12-18 NOTE — Progress Notes (Signed)
     12/18/2015 Brooke Davidson [id number] August 19, 1945   History of Present Illness:  This is a 70 year old female known to Dr. Adela LankArmbruster for colonoscopy in March 2017. At that time the study was incomplete due to poor preparation of the colon. Cologuard study was ordered, however, patient has not completed that. We did discuss this and she promises to complete that in the near future. Anyway, she is here today for your follow-up of diverticulitis. She's a very difficult historian, but obviously was having abdominal pain and presented to the emergency department on June 26 at which time she had a CT scan of the abdomen and pelvis without contrast that showed diverticulosis with mild stranding opacity around the mid descending colon possibly representing minimal changes of diverticulitis. She was placed on Cipro 500 mg twice daily for 7 days and Flagyl 500 mg 3 times daily for 7 days.  It appears that she should have completed these already, but she says that she still has a couple of pills left.  She is feeling much better, however, and no longer has any abdominal pain.  She says that she does have a lot of gas though and Beano has not helped.  Stools have been soft since being on the antibiotics.  Asking for something for hemorrhoidal itching/irritation as well.  Just of note, she was very difficult to obtain a history from and keep on track.   Current Medications, Allergies, Past Medical History, Past Surgical History, Family History and Social History were reviewed in [facility] electronic medical record.   Physical Exam: BP 120/70 mmHg  Pulse 66  Ht 5\' 4"  (1.626 m)  Wt 172 lb 9.6 oz (78.291 kg)  BMI 29.61 kg/m2  SpO2 97% General: Well developed black female in no acute distress Head: Normocephalic and atraumatic Eyes:  Sclerae anicteric, conjunctiva pink  Ears: Normal auditory acuity Lungs: Clear throughout to auscultation Heart: Regular rate and rhythm Abdomen: Soft,  non-distended.  Normal bowel sounds.  Non-tender. Musculoskeletal: Symmetrical with no gross deformities  Extremities: No edema  Neurological: Alert oriented x 4, grossly non-focal Psychological:  Alert and cooperative. Normal mood and affect  Assessment and Recommendations: -Diverticulitis:  Still on antibiotics but feeling much better already.  Finish course of antibiotics.  Will try IBgard for gas/bloating. -Hemorrhoidal itching/irritation:  Will give hydrocortisone cream to use prn.  *She still has not completed Cologuard but says that she will do so in the near future.

## 2015-12-18 NOTE — Progress Notes (Signed)
Agree with assessment and plan as outlined.  

## 2015-12-18 NOTE — Patient Instructions (Signed)
We sent a prescription to [facility] [address]. Hyrdrocortisone HC Cream.  We have given you samples of IB Gard with a coupon. [facility]

## 2015-12-25 MED ORDER — LACTATED RINGERS IV SOLN
INTRAVENOUS | Status: DC
  Administered 2015-12-28: 08:00:00 via INTRAVENOUS

## 2015-12-25 MED ORDER — CEFAZOLIN SODIUM-DEXTROSE 2-4 GM/100ML-% IV SOLN
2.0000 g | INTRAVENOUS | Status: AC
  Administered 2015-12-28: 2 g via INTRAVENOUS

## 2015-12-28 ENCOUNTER — Inpatient Hospital Stay (HOSPITAL_COMMUNITY): Payer: Medicare HMO | Admitting: Anesthesiology

## 2015-12-28 ENCOUNTER — Inpatient Hospital Stay (HOSPITAL_COMMUNITY)
Admission: RE | Admit: 2015-12-28 | Discharge: 2016-01-01 | DRG: 470 | Disposition: A | Discharge status: Skilled Nursing Facility | Payer: Medicare HMO | Source: Ambulatory Visit | Attending: Orthopedic Surgery | Admitting: Orthopedic Surgery

## 2015-12-28 ENCOUNTER — Encounter (HOSPITAL_COMMUNITY): Payer: Self-pay | Admitting: General Practice

## 2015-12-28 ENCOUNTER — Encounter (HOSPITAL_COMMUNITY)
Admission: RE | Disposition: A | Discharge status: Skilled Nursing Facility | Payer: Self-pay | Source: Ambulatory Visit | Attending: Orthopedic Surgery

## 2015-12-28 DIAGNOSIS — Z87891 Personal history of nicotine dependence: Secondary | ICD-10-CM

## 2015-12-28 DIAGNOSIS — E785 Hyperlipidemia, unspecified: Secondary | ICD-10-CM | POA: Diagnosis present

## 2015-12-28 DIAGNOSIS — M25561 Pain in right knee: Secondary | ICD-10-CM | POA: Diagnosis present

## 2015-12-28 DIAGNOSIS — Z883 Allergy status to other anti-infective agents status: Secondary | ICD-10-CM | POA: Diagnosis not present

## 2015-12-28 DIAGNOSIS — T8132XA Disruption of internal operation (surgical) wound, not elsewhere classified, initial encounter: Secondary | ICD-10-CM

## 2015-12-28 DIAGNOSIS — G8929 Other chronic pain: Secondary | ICD-10-CM | POA: Diagnosis present

## 2015-12-28 DIAGNOSIS — F329 Major depressive disorder, single episode, unspecified: Secondary | ICD-10-CM | POA: Diagnosis present

## 2015-12-28 DIAGNOSIS — M1711 Unilateral primary osteoarthritis, right knee: Secondary | ICD-10-CM | POA: Diagnosis present

## 2015-12-28 DIAGNOSIS — Z91048 Other nonmedicinal substance allergy status: Secondary | ICD-10-CM | POA: Diagnosis not present

## 2015-12-28 DIAGNOSIS — F259 Schizoaffective disorder, unspecified: Secondary | ICD-10-CM | POA: Diagnosis present

## 2015-12-28 DIAGNOSIS — R911 Solitary pulmonary nodule: Secondary | ICD-10-CM | POA: Diagnosis present

## 2015-12-28 DIAGNOSIS — K219 Gastro-esophageal reflux disease without esophagitis: Secondary | ICD-10-CM | POA: Diagnosis present

## 2015-12-28 DIAGNOSIS — W19XXXA Unspecified fall, initial encounter: Secondary | ICD-10-CM | POA: Diagnosis not present

## 2015-12-28 DIAGNOSIS — Z881 Allergy status to other antibiotic agents status: Secondary | ICD-10-CM | POA: Diagnosis not present

## 2015-12-28 DIAGNOSIS — Z885 Allergy status to narcotic agent status: Secondary | ICD-10-CM

## 2015-12-28 DIAGNOSIS — M549 Dorsalgia, unspecified: Secondary | ICD-10-CM | POA: Diagnosis present

## 2015-12-28 DIAGNOSIS — Z888 Allergy status to other drugs, medicaments and biological substances status: Secondary | ICD-10-CM | POA: Diagnosis not present

## 2015-12-28 DIAGNOSIS — L253 Unspecified contact dermatitis due to other chemical products: Secondary | ICD-10-CM | POA: Diagnosis present

## 2015-12-28 DIAGNOSIS — Z96651 Presence of right artificial knee joint: Secondary | ICD-10-CM | POA: Diagnosis present

## 2015-12-28 DIAGNOSIS — Z91041 Radiographic dye allergy status: Secondary | ICD-10-CM

## 2015-12-28 DIAGNOSIS — Z96659 Presence of unspecified artificial knee joint: Secondary | ICD-10-CM

## 2015-12-28 DIAGNOSIS — R4587 Impulsiveness: Secondary | ICD-10-CM | POA: Diagnosis present

## 2015-12-28 DIAGNOSIS — I10 Essential (primary) hypertension: Secondary | ICD-10-CM | POA: Diagnosis present

## 2015-12-28 DIAGNOSIS — T8130XA Disruption of wound, unspecified, initial encounter: Secondary | ICD-10-CM | POA: Diagnosis not present

## 2015-12-28 DIAGNOSIS — G4700 Insomnia, unspecified: Secondary | ICD-10-CM | POA: Diagnosis present

## 2015-12-28 DIAGNOSIS — M62838 Other muscle spasm: Secondary | ICD-10-CM | POA: Diagnosis present

## 2015-12-28 DIAGNOSIS — J45909 Unspecified asthma, uncomplicated: Secondary | ICD-10-CM | POA: Diagnosis present

## 2015-12-28 DIAGNOSIS — E119 Type 2 diabetes mellitus without complications: Secondary | ICD-10-CM | POA: Diagnosis present

## 2015-12-28 DIAGNOSIS — H409 Unspecified glaucoma: Secondary | ICD-10-CM | POA: Diagnosis present

## 2015-12-28 DIAGNOSIS — S76111A Strain of right quadriceps muscle, fascia and tendon, initial encounter: Secondary | ICD-10-CM | POA: Diagnosis present

## 2015-12-28 DIAGNOSIS — S86811A Strain of other muscle(s) and tendon(s) at lower leg level, right leg, initial encounter: Secondary | ICD-10-CM | POA: Diagnosis not present

## 2015-12-28 DIAGNOSIS — Y92239 Unspecified place in hospital as the place of occurrence of the external cause: Secondary | ICD-10-CM

## 2015-12-28 HISTORY — DX: Unspecified contact dermatitis due to other chemical products: L25.3

## 2015-12-28 HISTORY — DX: Strain of other muscle(s) and tendon(s) at lower leg level, right leg, initial encounter: S86.811A

## 2015-12-28 HISTORY — DX: Presence of unspecified artificial knee joint: Z96.659

## 2015-12-28 HISTORY — DX: Unspecified place in hospital as the place of occurrence of the external cause: Y92.239

## 2015-12-28 HISTORY — PX: TOTAL KNEE ARTHROPLASTY: SHX125

## 2015-12-28 HISTORY — DX: Unilateral primary osteoarthritis, right knee: M17.11

## 2015-12-28 HISTORY — DX: Unspecified fall, initial encounter: W19.XXXA

## 2015-12-28 LAB — GLUCOSE, CAPILLARY
GLUCOSE-CAPILLARY: 115 mg/dL — AB (ref 65–99)
GLUCOSE-CAPILLARY: 186 mg/dL — AB (ref 65–99)
Glucose-Capillary: 92 mg/dL (ref 65–99)
Glucose-Capillary: 159 mg/dL — ABNORMAL HIGH (ref 65–99)

## 2015-12-28 SURGERY — ARTHROPLASTY, KNEE, TOTAL
Anesthesia: Monitor Anesthesia Care | Site: Knee | Laterality: Right

## 2015-12-28 MED ORDER — BUPIVACAINE HCL (PF) 0.25 % IJ SOLN
INTRAMUSCULAR | Status: DC | PRN
  Administered 2015-12-28: 30 mL

## 2015-12-28 MED ORDER — EPHEDRINE SULFATE 50 MG/ML IJ SOLN
INTRAMUSCULAR | Status: DC | PRN
  Administered 2015-12-28 (×4): 10 mg via INTRAVENOUS

## 2015-12-28 MED ORDER — INSULIN ASPART 100 UNIT/ML ~~LOC~~ SOLN
0.0000 [IU] | Freq: Three times a day (TID) | SUBCUTANEOUS | Status: DC
  Administered 2015-12-28: 3 [IU] via SUBCUTANEOUS
  Administered 2015-12-29 – 2015-12-30 (×3): 2 [IU] via SUBCUTANEOUS

## 2015-12-28 MED ORDER — LORAZEPAM 2 MG/ML IJ SOLN
0.5000 mg | Freq: Four times a day (QID) | INTRAMUSCULAR | Status: DC | PRN
  Administered 2015-12-30: 0.5 mg via INTRAMUSCULAR
  Filled 2015-12-28 (×2): qty 1

## 2015-12-28 MED ORDER — DEXAMETHASONE SODIUM PHOSPHATE 10 MG/ML IJ SOLN
INTRAMUSCULAR | Status: DC | PRN
  Administered 2015-12-28: 10 mg via INTRAVENOUS

## 2015-12-28 MED ORDER — PHENYLEPHRINE 40 MCG/ML (10ML) SYRINGE FOR IV PUSH (FOR BLOOD PRESSURE SUPPORT)
PREFILLED_SYRINGE | INTRAVENOUS | Status: AC
  Filled 2015-12-28: qty 10

## 2015-12-28 MED ORDER — ALBUTEROL SULFATE (2.5 MG/3ML) 0.083% IN NEBU: 2.5000 mg | INHALATION_SOLUTION | RESPIRATORY_TRACT | Status: DC | PRN

## 2015-12-28 MED ORDER — BUPIVACAINE-EPINEPHRINE (PF) 0.5% -1:200000 IJ SOLN
INTRAMUSCULAR | Status: DC | PRN
  Administered 2015-12-28: 25 mL via PERINEURAL

## 2015-12-28 MED ORDER — HYDROMORPHONE HCL 1 MG/ML IJ SOLN: 0.2500 mg | INTRAMUSCULAR | Status: DC | PRN

## 2015-12-28 MED ORDER — MIDAZOLAM HCL 5 MG/5ML IJ SOLN
INTRAMUSCULAR | Status: DC | PRN
  Administered 2015-12-28: 2 mg via INTRAVENOUS

## 2015-12-28 MED ORDER — PROPOFOL 10 MG/ML IV BOLUS
INTRAVENOUS | Status: AC
Start: 2015-12-28 — End: 2015-12-28
  Filled 2015-12-28: qty 20

## 2015-12-28 MED ORDER — HYDROMORPHONE HCL 1 MG/ML IJ SOLN
0.5000 mg | INTRAMUSCULAR | Status: DC | PRN
  Administered 2015-12-28: 0.5 mg via INTRAVENOUS
  Administered 2015-12-29 – 2015-12-31 (×4): 1 mg via INTRAVENOUS
  Filled 2015-12-28 (×5): qty 1

## 2015-12-28 MED ORDER — DOCUSATE SODIUM 100 MG PO CAPS
100.0000 mg | ORAL_CAPSULE | Freq: Two times a day (BID) | ORAL | Status: DC
Start: 2015-12-28 — End: 2016-01-01
  Administered 2015-12-28 – 2016-01-01 (×8): 100 mg via ORAL
  Filled 2015-12-28 (×8): qty 1

## 2015-12-28 MED ORDER — HYDROCHLOROTHIAZIDE 10 MG/ML ORAL SUSPENSION
6.2500 mg | Freq: Every day | ORAL | Status: DC
  Administered 2015-12-30 – 2016-01-01 (×3): 6.25 mg via ORAL
  Filled 2015-12-28 (×3): qty 1.25

## 2015-12-28 MED ORDER — BUPIVACAINE IN DEXTROSE 0.75-8.25 % IT SOLN
INTRATHECAL | Status: DC | PRN
  Administered 2015-12-28: 1.8 mL via INTRATHECAL

## 2015-12-28 MED ORDER — APIXABAN 2.5 MG PO TABS
2.5000 mg | ORAL_TABLET | Freq: Two times a day (BID) | ORAL | Status: DC
  Administered 2015-12-29 – 2016-01-01 (×7): 2.5 mg via ORAL
  Filled 2015-12-28 (×8): qty 1

## 2015-12-28 MED ORDER — ONDANSETRON HCL 4 MG/2ML IJ SOLN
INTRAMUSCULAR | Status: DC | PRN
  Administered 2015-12-28: 4 mg via INTRAVENOUS

## 2015-12-28 MED ORDER — ACETAMINOPHEN 325 MG PO TABS
650.0000 mg | ORAL_TABLET | Freq: Four times a day (QID) | ORAL | Status: DC | PRN
  Administered 2015-12-29 – 2016-01-01 (×6): 650 mg via ORAL
  Filled 2015-12-28 (×6): qty 2

## 2015-12-28 MED ORDER — PRAVASTATIN SODIUM 40 MG PO TABS
80.0000 mg | ORAL_TABLET | Freq: Every day | ORAL | Status: DC
  Administered 2015-12-28 – 2015-12-31 (×4): 80 mg via ORAL
  Filled 2015-12-28 (×4): qty 2

## 2015-12-28 MED ORDER — MIDAZOLAM HCL 2 MG/2ML IJ SOLN
INTRAMUSCULAR | Status: AC
  Filled 2015-12-28: qty 2

## 2015-12-28 MED ORDER — ONDANSETRON HCL 4 MG PO TABS: 4.0000 mg | ORAL_TABLET | Freq: Four times a day (QID) | ORAL | Status: DC | PRN

## 2015-12-28 MED ORDER — ALUM & MAG HYDROXIDE-SIMETH 200-200-20 MG/5ML PO SUSP: 30.0000 mL | ORAL | Status: DC | PRN

## 2015-12-28 MED ORDER — CEFAZOLIN SODIUM-DEXTROSE 2-4 GM/100ML-% IV SOLN
2.0000 g | Freq: Four times a day (QID) | INTRAVENOUS | Status: AC
  Administered 2015-12-28 (×2): 2 g via INTRAVENOUS
  Filled 2015-12-28 (×2): qty 100

## 2015-12-28 MED ORDER — METOCLOPRAMIDE HCL 5 MG/ML IJ SOLN: 5.0000 mg | Freq: Three times a day (TID) | INTRAMUSCULAR | Status: DC | PRN

## 2015-12-28 MED ORDER — PROPOFOL 10 MG/ML IV BOLUS
INTRAVENOUS | Status: DC | PRN
  Administered 2015-12-28: 20 mg via INTRAVENOUS
  Administered 2015-12-28: 30 mg via INTRAVENOUS
  Administered 2015-12-28: 20 mg via INTRAVENOUS
  Administered 2015-12-28: 130 mg via INTRAVENOUS

## 2015-12-28 MED ORDER — FENTANYL CITRATE (PF) 100 MCG/2ML IJ SOLN
INTRAMUSCULAR | Status: AC
  Filled 2015-12-28: qty 2

## 2015-12-28 MED ORDER — OXYCODONE HCL 5 MG PO TABS: 5.0000 mg | ORAL_TABLET | Freq: Once | ORAL | Status: DC | PRN

## 2015-12-28 MED ORDER — MIDAZOLAM HCL 2 MG/2ML IJ SOLN
1.0000 mg | Freq: Once | INTRAMUSCULAR | Status: AC
  Administered 2015-12-28: 1 mg via INTRAVENOUS

## 2015-12-28 MED ORDER — POLYETHYLENE GLYCOL 3350 17 G PO PACK
17.0000 g | PACK | Freq: Two times a day (BID) | ORAL | Status: DC
Start: 2015-12-28 — End: 2016-01-01
  Administered 2015-12-28 – 2016-01-01 (×6): 17 g via ORAL
  Filled 2015-12-28 (×8): qty 1

## 2015-12-28 MED ORDER — OXYCODONE HCL 5 MG/5ML PO SOLN: 5.0000 mg | Freq: Once | ORAL | Status: DC | PRN

## 2015-12-28 MED ORDER — LOSARTAN POTASSIUM-HCTZ 100-12.5 MG PO TABS: 0.5000 | ORAL_TABLET | Freq: Every day | ORAL | Status: DC

## 2015-12-28 MED ORDER — FENTANYL CITRATE (PF) 100 MCG/2ML IJ SOLN
INTRAMUSCULAR | Status: DC | PRN
  Administered 2015-12-28: 50 ug via INTRAVENOUS

## 2015-12-28 MED ORDER — LOSARTAN POTASSIUM 50 MG PO TABS
50.0000 mg | ORAL_TABLET | Freq: Every day | ORAL | Status: DC
  Administered 2015-12-30 – 2016-01-01 (×3): 50 mg via ORAL
  Filled 2015-12-28 (×3): qty 1

## 2015-12-28 MED ORDER — LACTATED RINGERS IV SOLN
INTRAVENOUS | Status: DC | PRN
  Administered 2015-12-28 (×2): via INTRAVENOUS

## 2015-12-28 MED ORDER — MOMETASONE FURO-FORMOTEROL FUM 100-5 MCG/ACT IN AERO
2.0000 | INHALATION_SPRAY | Freq: Two times a day (BID) | RESPIRATORY_TRACT | Status: DC
  Administered 2015-12-29 – 2015-12-30 (×2): 2 via RESPIRATORY_TRACT
  Filled 2015-12-28: qty 8.8

## 2015-12-28 MED ORDER — LATANOPROST 0.005 % OP SOLN
1.0000 [drp] | Freq: Every day | OPHTHALMIC | Status: DC
  Administered 2015-12-28 – 2015-12-31 (×4): 1 [drp] via OPHTHALMIC
  Filled 2015-12-28: qty 2.5

## 2015-12-28 MED ORDER — SODIUM CHLORIDE 0.9 % IR SOLN
Status: DC | PRN
  Administered 2015-12-28: 1000 mL
  Administered 2015-12-28: 3000 mL

## 2015-12-28 MED ORDER — OXYCODONE HCL 5 MG PO TABS
5.0000 mg | ORAL_TABLET | ORAL | Status: DC | PRN
  Administered 2015-12-28 – 2015-12-31 (×7): 10 mg via ORAL
  Administered 2015-12-31: 5 mg via ORAL
  Administered 2015-12-31 – 2016-01-01 (×4): 10 mg via ORAL
  Filled 2015-12-28 (×3): qty 2
  Filled 2015-12-28: qty 1
  Filled 2015-12-28 (×8): qty 2

## 2015-12-28 MED ORDER — MENTHOL 3 MG MT LOZG
1.0000 | LOZENGE | OROMUCOSAL | Status: DC | PRN
Start: 2015-12-28 — End: 2016-01-01

## 2015-12-28 MED ORDER — ACETAMINOPHEN 650 MG RE SUPP
650.0000 mg | Freq: Four times a day (QID) | RECTAL | Status: DC | PRN
Start: 2015-12-28 — End: 2016-01-01

## 2015-12-28 MED ORDER — LORAZEPAM 0.5 MG PO TABS
0.5000 mg | ORAL_TABLET | Freq: Four times a day (QID) | ORAL | Status: DC | PRN
  Administered 2015-12-29 – 2015-12-30 (×3): 0.5 mg via ORAL
  Filled 2015-12-28 (×4): qty 1

## 2015-12-28 MED ORDER — FENTANYL CITRATE (PF) 100 MCG/2ML IJ SOLN
50.0000 ug | Freq: Once | INTRAMUSCULAR | Status: AC
Start: 2015-12-28 — End: 2015-12-28
  Administered 2015-12-28: 50 ug via INTRAVENOUS

## 2015-12-28 MED ORDER — ONDANSETRON HCL 4 MG/2ML IJ SOLN: 4.0000 mg | Freq: Four times a day (QID) | INTRAMUSCULAR | Status: DC | PRN

## 2015-12-28 MED ORDER — INSULIN ASPART 100 UNIT/ML ~~LOC~~ SOLN: 0.0000 [IU] | Freq: Every day | SUBCUTANEOUS | Status: DC

## 2015-12-28 MED ORDER — PROPOFOL 500 MG/50ML IV EMUL
INTRAVENOUS | Status: DC | PRN
  Administered 2015-12-28: 60 ug/kg/min via INTRAVENOUS

## 2015-12-28 MED ORDER — METOCLOPRAMIDE HCL 5 MG PO TABS: 5.0000 mg | ORAL_TABLET | Freq: Three times a day (TID) | ORAL | Status: DC | PRN

## 2015-12-28 MED ORDER — PHENOL 1.4 % MT LIQD
1.0000 | OROMUCOSAL | Status: DC | PRN
  Administered 2015-12-30: 1 via OROMUCOSAL
  Filled 2015-12-28: qty 177

## 2015-12-28 MED ORDER — POTASSIUM CHLORIDE IN NACL 20-0.9 MEQ/L-% IV SOLN
INTRAVENOUS | Status: DC
  Administered 2015-12-28: 1000 mL via INTRAVENOUS
  Administered 2015-12-31 (×2): via INTRAVENOUS
  Filled 2015-12-28: qty 1000

## 2015-12-28 MED ORDER — INSULIN GLARGINE 100 UNIT/ML ~~LOC~~ SOLN
15.0000 [IU] | Freq: Every day | SUBCUTANEOUS | Status: DC
  Administered 2015-12-28: 15 [IU] via SUBCUTANEOUS
  Filled 2015-12-28: qty 0.15

## 2015-12-28 MED ORDER — CHLORHEXIDINE GLUCONATE 4 % EX LIQD: 60.0000 mL | Freq: Once | CUTANEOUS | Status: DC

## 2015-12-28 MED ORDER — LORATADINE 10 MG PO TABS
10.0000 mg | ORAL_TABLET | Freq: Every day | ORAL | Status: DC
  Administered 2015-12-28 – 2016-01-01 (×5): 10 mg via ORAL
  Filled 2015-12-28 (×4): qty 1

## 2015-12-28 MED ORDER — DEXAMETHASONE SODIUM PHOSPHATE 10 MG/ML IJ SOLN
10.0000 mg | Freq: Three times a day (TID) | INTRAMUSCULAR | Status: AC
  Administered 2015-12-28 – 2015-12-29 (×4): 10 mg via INTRAVENOUS
  Filled 2015-12-28 (×4): qty 1

## 2015-12-28 MED ORDER — DIPHENHYDRAMINE HCL 12.5 MG/5ML PO ELIX: 12.5000 mg | ORAL_SOLUTION | ORAL | Status: DC | PRN

## 2015-12-28 MED ORDER — FENTANYL CITRATE (PF) 250 MCG/5ML IJ SOLN
INTRAMUSCULAR | Status: AC
  Filled 2015-12-28: qty 5

## 2015-12-28 MED ORDER — ZOLPIDEM TARTRATE 5 MG PO TABS
5.0000 mg | ORAL_TABLET | Freq: Every day | ORAL | Status: DC
  Administered 2015-12-28 – 2015-12-31 (×4): 5 mg via ORAL
  Filled 2015-12-28 (×4): qty 1

## 2015-12-28 SURGICAL SUPPLY — 68 items
BANDAGE ACE 6X5 VEL STRL LF (GAUZE/BANDAGES/DRESSINGS) ×2 IMPLANT
BANDAGE ESMARK 6X9 LF (GAUZE/BANDAGES/DRESSINGS) ×1 IMPLANT
BENZOIN TINCTURE PRP APPL 2/3 (GAUZE/BANDAGES/DRESSINGS) ×2 IMPLANT
BLADE SAGITTAL 25.0X1.19X90 (BLADE) ×2 IMPLANT
BLADE SAW SGTL 13X75X1.27 (BLADE) ×2 IMPLANT
BLADE SURG 10 STRL SS (BLADE) ×4 IMPLANT
BNDG ELASTIC 6X15 VLCR STRL LF (GAUZE/BANDAGES/DRESSINGS) ×2 IMPLANT
BNDG ESMARK 6X9 LF (GAUZE/BANDAGES/DRESSINGS) ×2
BOWL SMART MIX CTS (DISPOSABLE) ×2 IMPLANT
CAPT KNEE TOTAL 3 ATTUNE ×2 IMPLANT
CEMENT HV SMART SET (Cement) ×4 IMPLANT
COVER SURGICAL LIGHT HANDLE (MISCELLANEOUS) ×2 IMPLANT
CUFF TOURNIQUET SINGLE 34IN LL (TOURNIQUET CUFF) ×2 IMPLANT
CUFF TOURNIQUET SINGLE 44IN (TOURNIQUET CUFF) IMPLANT
DECANTER SPIKE VIAL GLASS SM (MISCELLANEOUS) ×2 IMPLANT
DRAPE EXTREMITY T 121X128X90 (DRAPE) ×2 IMPLANT
DRAPE INCISE IOBAN 66X45 STRL (DRAPES) ×2 IMPLANT
DRAPE PROXIMA HALF (DRAPES) ×2 IMPLANT
DRAPE U-SHAPE 47X51 STRL (DRAPES) ×2 IMPLANT
DRSG AQUACEL AG ADV 3.5X14 (GAUZE/BANDAGES/DRESSINGS) ×2 IMPLANT
DURAPREP 26ML APPLICATOR (WOUND CARE) ×4 IMPLANT
ELECT CAUTERY BLADE 6.4 (BLADE) ×2 IMPLANT
ELECT REM PT RETURN 9FT ADLT (ELECTROSURGICAL) ×2
ELECTRODE REM PT RTRN 9FT ADLT (ELECTROSURGICAL) ×1 IMPLANT
FACESHIELD WRAPAROUND (MASK) ×4 IMPLANT
GLOVE BIO SURGEON STRL SZ7 (GLOVE) ×4 IMPLANT
GLOVE BIOGEL PI IND STRL 7.0 (GLOVE) ×3 IMPLANT
GLOVE BIOGEL PI IND STRL 7.5 (GLOVE) ×1 IMPLANT
GLOVE BIOGEL PI INDICATOR 7.0 (GLOVE) ×3
GLOVE BIOGEL PI INDICATOR 7.5 (GLOVE) ×1
GLOVE SS BIOGEL STRL SZ 7.5 (GLOVE) ×1 IMPLANT
GLOVE SUPERSENSE BIOGEL SZ 7.5 (GLOVE) ×1
GOWN STRL REUS W/ TWL LRG LVL3 (GOWN DISPOSABLE) ×2 IMPLANT
GOWN STRL REUS W/ TWL XL LVL3 (GOWN DISPOSABLE) ×2 IMPLANT
GOWN STRL REUS W/TWL LRG LVL3 (GOWN DISPOSABLE) ×2
GOWN STRL REUS W/TWL XL LVL3 (GOWN DISPOSABLE) ×2
HANDPIECE INTERPULSE COAX TIP (DISPOSABLE) ×1
HOOD PEEL AWAY FACE SHEILD DIS (HOOD) ×4 IMPLANT
IMMOBILIZER KNEE 22 UNIV (SOFTGOODS) ×2 IMPLANT
KIT BASIN OR (CUSTOM PROCEDURE TRAY) ×2 IMPLANT
KIT ROOM TURNOVER OR (KITS) ×2 IMPLANT
MANIFOLD NEPTUNE II (INSTRUMENTS) ×2 IMPLANT
MARKER SKIN DUAL TIP RULER LAB (MISCELLANEOUS) ×2 IMPLANT
NEEDLE 18GX1X1/2 (RX/OR ONLY) (NEEDLE) ×2 IMPLANT
NS IRRIG 1000ML POUR BTL (IV SOLUTION) ×2 IMPLANT
PACK TOTAL JOINT (CUSTOM PROCEDURE TRAY) ×2 IMPLANT
PAD ARMBOARD 7.5X6 YLW CONV (MISCELLANEOUS) ×4 IMPLANT
PIN STEINMAN FIXATION KNEE (PIN) ×2 IMPLANT
SET HNDPC FAN SPRY TIP SCT (DISPOSABLE) ×1 IMPLANT
STRIP CLOSURE SKIN 1/2X4 (GAUZE/BANDAGES/DRESSINGS) ×2 IMPLANT
SUCTION FRAZIER HANDLE 10FR (MISCELLANEOUS) ×1
SUCTION TUBE FRAZIER 10FR DISP (MISCELLANEOUS) ×1 IMPLANT
SUT FIBERWIRE 2-0 18 17.9 3/8 (SUTURE) ×2
SUT MNCRL AB 3-0 PS2 18 (SUTURE) ×2 IMPLANT
SUT VIC AB 0 CT1 27 (SUTURE) ×2
SUT VIC AB 0 CT1 27XBRD ANBCTR (SUTURE) ×2 IMPLANT
SUT VIC AB 1 CT1 27 (SUTURE) ×1
SUT VIC AB 1 CT1 27XBRD ANBCTR (SUTURE) ×1 IMPLANT
SUT VIC AB 2-0 CT1 27 (SUTURE) ×2
SUT VIC AB 2-0 CT1 TAPERPNT 27 (SUTURE) ×2 IMPLANT
SUTURE FIBERWR 2-0 18 17.9 3/8 (SUTURE) ×1 IMPLANT
SYR 30ML LL (SYRINGE) ×2 IMPLANT
TIP HIGH FLOW IRRIGATION COAX (MISCELLANEOUS) ×2 IMPLANT
TOWEL OR 17X24 6PK STRL BLUE (TOWEL DISPOSABLE) ×2 IMPLANT
TOWEL OR 17X26 10 PK STRL BLUE (TOWEL DISPOSABLE) ×2 IMPLANT
TRAY FOLEY CATH 16FR SILVER (SET/KITS/TRAYS/PACK) ×2 IMPLANT
TUBE CONNECTING 12X1/4 (SUCTIONS) ×2 IMPLANT
YANKAUER SUCT BULB TIP NO VENT (SUCTIONS) ×2 IMPLANT

## 2015-12-28 NOTE — Interval H&P Note (Signed)
History and Physical Interval Note:  12/28/2015 7:04 AM  Glean SalvoSandra H Ohlinger  has presented today for surgery, with the diagnosis of Primary localized OA right knee  The various methods of treatment have been discussed with the patient and family. After consideration of risks, benefits and other options for treatment, the patient has consented to  Procedure(s): TOTAL KNEE ARTHROPLASTY (Right) as a surgical intervention .  The patient's history has been reviewed, patient examined, no change in status, stable for surgery.  I have reviewed the patient's chart and labs.  Questions were answered to the patient's satisfaction.     Salvatore MarvelWAINER,Jahmeer Porche A

## 2015-12-28 NOTE — Anesthesia Procedure Notes (Addendum)
Anesthesia Regional Block:  Adductor canal block  Pre-Anesthetic Checklist: ,, timeout performed, Correct Patient, Correct Site, Correct Laterality, Correct Procedure, Correct Position, site marked, Risks and benefits discussed,  Surgical consent,  Pre-op evaluation,  At surgeon's request and post-op pain management  Laterality: Right  Prep: chloraprep       Needles:  Injection technique: Single-shot  Needle Type: Echogenic Needle     Needle Length: 9cm 9 cm Needle Gauge: 21 and 21 G    Additional Needles:  Procedures: ultrasound guided (picture in chart) Adductor canal block Narrative:  Start time: 12/28/2015 8:17 AM End time: 12/28/2015 8:23 AM Injection made incrementally with aspirations every 5 mL.  Performed by: Personally  Anesthesiologist: Haylen Shelnutt  Additional Notes: Pt tolerated the procedure well.   Procedure Name: MAC Date/Time: 12/28/2015 9:02 AM Performed by: Carmela RimaMARTINELLI, JOHN F Pre-anesthesia Checklist: Timeout performed, Patient being monitored, Suction available, Emergency Drugs available and Patient identified Oxygen Delivery Method: Simple face mask Placement Confirmation: positive ETCO2 Dental Injury: Teeth and Oropharynx as per pre-operative assessment     Procedure Name: LMA Insertion Date/Time: 12/28/2015 9:54 AM Performed by: Carmela RimaMARTINELLI, JOHN F Pre-anesthesia Checklist: Timeout performed, Patient being monitored, Suction available, Emergency Drugs available and Patient identified Patient Re-evaluated:Patient Re-evaluated prior to inductionOxygen Delivery Method: Circle system utilized Preoxygenation: Pre-oxygenation with 100% oxygen Intubation Type: IV induction Ventilation: Mask ventilation without difficulty LMA: LMA inserted LMA Size: 4.0 Placement Confirmation: breath sounds checked- equal and bilateral and positive ETCO2 Tube secured with: Tape Dental Injury: Teeth and Oropharynx as per pre-operative assessment      Spinal Patient location during procedure: OR Start time: 12/28/2015 9:11 AM End time: 12/28/2015 9:15 AM Staffing Anesthesiologist: Zavier Canela Performed by: anesthesiologist  Preanesthetic Checklist Completed: patient identified, site marked, surgical consent, pre-op evaluation, timeout performed, IV checked, risks and benefits discussed and monitors and equipment checked Spinal Block Patient position: sitting Prep: Betadine Patient monitoring: heart rate, cardiac monitor, continuous pulse ox and blood pressure Approach: midline Location: L3-4 Injection technique: single-shot Needle Needle type: Pencan  Needle gauge: 25 G Needle length: 10 cm Assessment Sensory level: T6 Additional Notes Pt tolerated the procedure well.

## 2015-12-28 NOTE — Transfer of Care (Signed)
Immediate Anesthesia Transfer of Care Note  Patient: Brooke Davidson  Procedure(s) Performed: Procedure(s): TOTAL KNEE ARTHROPLASTY (Right)  Patient Location: PACU  Anesthesia Type:General  Level of Consciousness: awake, alert  and oriented  Airway & Oxygen Therapy: Patient Spontanous Breathing and Patient connected to nasal cannula oxygen  Post-op Assessment: Report given to RN, Post -op Vital signs reviewed and stable and Patient moving all extremities X 4  Post vital signs: Reviewed and stable  Last Vitals:  Filed Vitals:   12/28/15 0830 12/28/15 0835  BP: 128/61 130/67  Pulse: 56 60  Temp:    Resp: 15 25    Last Pain:  Filed Vitals:   12/28/15 0838  PainSc: 4       Patients Stated Pain Goal: 3 (12/28/15 0804)  Complications: No apparent anesthesia complications

## 2015-12-28 NOTE — Op Note (Signed)
MRN:     [id number] DOB/AGE:    1945-12-13 / 70 y.o.       OPERATIVE REPORT    DATE OF PROCEDURE:  12/28/2015       PREOPERATIVE DIAGNOSIS:   PRIMARY LOCALIZED OA RIGHT KNEE      Estimated body mass index is 29.61 kg/(m^2) as calculated from the following:   Height as of this encounter:  (1.626 m).   Weight as of this encounter: 78.274 kg (172 lb 9 oz).                                                        POSTOPERATIVE DIAGNOSIS:   SAME                                                                      PROCEDURE:  Procedure(s): TOTAL KNEE ARTHROPLASTY Using Depuy Attune RP implants #4 Femur, #4Tibia, 7mm  RP bearing, 32 Patella     SURGEON: Lima Chillemi A    ASSISTANT:  Kirstin Shepperson PA-C   (Present and scrubbed throughout the case, critical for assistance with exposure, retraction, instrumentation, and closure.)         ANESTHESIA: Spinal/GET with Adductor Nerve Block     TOURNIQUET TIME:   COMPLICATIONS:  None     SPECIMENS: None   INDICATIONS FOR PROCEDURE: The patient has  DJD RIGHT KNEE, varus deformities, XR shows bone on bone arthritis. Patient has failed all conservative measures including anti-inflammatory medicines, narcotics, attempts at  exercise and weight loss, cortisone injections and viscosupplementation.  Risks and benefits of surgery have been discussed, questions answered.   DESCRIPTION OF PROCEDURE: The patient identified by armband, received  right femoral nerve block and IV antibiotics, in the holding area at [facility]. Patient taken to the operating room, appropriate anesthetic  monitors were attached General endotracheal anesthesia induced with  the patient in supine position, Foley catheter was inserted. Tourniquet  applied high to the operative thigh. Lateral post and foot positioner  applied to the table, the lower extremity was then prepped and draped  in usual sterile fashion from the ankle to the tourniquet. Time-out  procedure was performed. The limb was wrapped with an Esmarch bandage and the tourniquet inflated to 365 mmHg. We began the operation by making the anterior midline incision starting at handbreadth above the patella going over the patella 1 cm medial to and  4 cm distal to the tibial tubercle. Small bleeders in the skin and the  subcutaneous tissue identified and cauterized. Transverse retinaculum was incised and reflected medially and a medial parapatellar arthrotomy was accomplished. the patella was everted and theprepatellar fat pad resected. The superficial medial collateral  ligament was then elevated from anterior to posterior along the proximal  flare of the tibia and anterior half of the menisci resected. The knee was hyperflexed exposing bone on bone arthritis. Peripheral and notch osteophytes as well as the cruciate ligaments were then resected. We continued to  work our way around posteriorly along the proximal tibia, and externally  rotated the tibia  subluxing it out from underneath the femur. A McHale  retractor was placed through the notch and a lateral Hohmann retractor  placed, and we then drilled through the proximal tibia in line with the  axis of the tibia followed by an intramedullary guide rod and 2-degree  posterior slope cutting guide. The tibial cutting guide was pinned into place  allowing resection of 4 mm of bone medially and about 6 mm of bone  laterally because of her varus deformity. Satisfied with the tibial resection, we then  entered the distal femur 2 mm anterior to the PCL origin with the  intramedullary guide rod and applied the distal femoral cutting guide  set at 11mm, with 5 degrees of valgus. This was pinned along the  epicondylar axis. At this point, the distal femoral cut was accomplished without difficulty. We then sized for a #4 femoral component and pinned the guide in 3 degrees of external rotation.The chamfer cutting guide was pinned into place. The  anterior, posterior, and chamfer cuts were accomplished without difficulty followed by  the  RP box cutting guide and the box cut. We also removed posterior osteophytes from the posterior femoral condyles. At this  time, the knee was brought into full extension. We checked our  extension and flexion gaps and found them symmetric at 7mm.  The patella thickness measured at 25 mm. We set the cutting guide at 15 and removed the posterior 9.5-10 mm  of the patella sized for 32 button and drilled the lollipop. The knee  was then once again hyperflexed exposing the proximal tibia. We sized for a #4 tibial base plate, applied the smokestack and the conical reamer followed by the the Delta fin keel punch. We then hammered into place the  RP trial femoral component, inserted a 1 trial bearing, trial patellar button, and took the knee through range of motion from 0-130 degrees. No thumb pressure was required for patellar  tracking. At this point, all trial components were removed, a double batch of DePuy HV cement was mixed and applied to all bony metallic mating surfaces except for the posterior condyles of the femur itself. In order, we  hammered into place the tibial tray and removed excess cement, the femoral component and removed excess cement, a 7mm  RP bearing  was inserted, and the knee brought to full extension with compression.  The patellar button was clamped into place, and excess cement  removed. While the cement cured the wound was irrigated out with normal saline solution pulse lavage.. Ligament stability and patellar tracking were checked and found to be excellent.. The parapatellar arthrotomy was closed with  #1 Vicryl suture. The subcutaneous tissue with 0 and 2-0 undyed  Vicryl suture, and 4-0 Monocryl.. A dressing of Aquaseal,  4 x 4, dressing sponges, Webril, and Ace wrap applied. Needle and sponge count were correct times 2.The patient awakened, extubated, and taken to recovery room without  difficulty. Vascular status was normal, pulses 2+ and symmetric.   Brooke Davidson A 12/28/2015, 10:49 AM

## 2015-12-28 NOTE — Progress Notes (Signed)
Orthopedic Tech Progress Note Patient Details:  Brooke Davidson Bloodgood Aug 19, 1945 [id number] Ortho visit put on cpm at 1835 Patient ID: Brooke Davidson Beighley, female   DOB: Aug 19, 1945, 70 y.o.   MRN: [id number]   [name], Other Atienza [name] 12/28/2015, 6:39 PM

## 2015-12-28 NOTE — Evaluation (Signed)
Physical Therapy Evaluation Patient Details Name: Brooke Davidson MRN: [id number] DOB: 09/12/1945 Today's Date: 12/28/2015   History of Present Illness  Patient is a 70 y/o female admitted s/p R TKA.  PMH positive for L TKA 7/16, HTN, DM, HLD, Asthma, Glaucoma and Schizoaffective disorder.   Clinical Impression  Patient presents with decreased independence with mobility due to deficits listed in PT problem list.  She will benefit from skilled PT in the acute setting to allow return home alone following SNF level rehab stay.     Follow Up Recommendations SNF    Equipment Recommendations  None recommended by PT    Recommendations for Other Services       Precautions / Restrictions Precautions Precautions: Fall;Knee Required Braces or Orthoses: Knee Immobilizer - Right Knee Immobilizer - Right: On when out of bed or walking Restrictions Weight Bearing Restrictions: Yes RLE Weight Bearing: Weight bearing as tolerated      Mobility  Bed Mobility Overal bed mobility: Needs Assistance Bed Mobility: Supine to Sit     Supine to sit: Min assist     General bed mobility comments: for safety due to IV and foley and for scooting to EOB  Transfers Overall transfer level: Needs assistance Equipment used: Rolling walker (2 wheeled) Transfers: Sit to/from Stand Sit to Stand: Supervision         General transfer comment: stood prior to walker in front of her, cues for safety  Ambulation/Gait Ambulation/Gait assistance: Min assist Ambulation Distance (Feet): 100 Feet Assistive device: Rolling walker (2 wheeled) Gait Pattern/deviations: Step-through pattern;Decreased stride length;Antalgic Gait velocity: slow   General Gait Details: with immobilizer assist for sequence and safety  Stairs            Wheelchair Mobility    Modified Rankin (Stroke Patients Only)       Balance Overall balance assessment: No apparent balance deficits (not formally assessed)                                           Pertinent Vitals/Pain Pain Assessment: 0-10 Pain Score: 3  Pain Location: R knee Pain Descriptors / Indicators: Aching;Sore Pain Intervention(s): Monitored during session;Repositioned;Ice applied;Patient requesting pain meds-RN notified    Home Living Family/patient expects to be discharged to:: Skilled nursing facility Living Arrangements: Alone   Type of Home: House Home Access: Level entry     Home Layout: One level Home Equipment: Cane - single point;Walker - 4 wheels      Prior Function Level of Independence: Independent with assistive device(s)         Comments: reports used rollator or cane at home, states had difficutly with curbs and getting into her SUV     Hand Dominance        Extremity/Trunk Assessment   Upper Extremity Assessment: Overall WFL for tasks assessed           Lower Extremity Assessment: RLE deficits/detail RLE Deficits / Details: AAROM gossly 70 degrees flexion, extension -5, strength at least 3-/5 throughout       Communication   Communication: No difficulties  Cognition Arousal/Alertness: Awake/alert Behavior During Therapy: WFL for tasks assessed/performed Overall Cognitive Status: Within Functional Limits for tasks assessed                      General Comments      Exercises Total Joint Exercises  Ankle Circles/Pumps: AROM;Both;15 reps;Supine Quad Sets: AROM;Both;10 reps;Supine Towel Squeeze: AROM;Both;10 reps;Supine Short Arc Quad: AROM;Right;10 reps;Supine Heel Slides: AAROM;Right;10 reps;Supine Hip ABduction/ADduction: AROM;Right;10 reps;AAROM;Supine Straight Leg Raises: AAROM;Right;10 reps;Supine      Assessment/Plan    PT Assessment Patient needs continued PT services  PT Diagnosis Acute pain;Difficulty walking   PT Problem List Decreased strength;Decreased balance;Decreased mobility;Decreased range of motion;Decreased activity  tolerance;Pain;Decreased knowledge of precautions  PT Treatment Interventions DME instruction;Gait training;Balance training;Functional mobility training;Patient/family education;Therapeutic activities;Therapeutic exercise   PT Goals (Current goals can be found in the Care Plan section) Acute Rehab PT Goals Patient Stated Goal: To [facility] then to home PT Goal Formulation: With patient Time For Goal Achievement: 01/02/16 Potential to Achieve Goals: Good    Frequency 7X/week   Barriers to discharge Decreased caregiver support      Co-evaluation               End of Session Equipment Utilized During Treatment: Gait belt;Right knee immobilizer Activity Tolerance: Patient tolerated treatment well Patient left: with call bell/phone within reach;in chair;with chair alarm set           Time: 1555-1655 PT Time Calculation (min) (ACUTE ONLY): 60 min   Charges:   PT Evaluation $PT Eval Moderate Complexity: 1 Procedure PT Treatments $Gait Training: 8-22 mins $Therapeutic Exercise: 23-37 mins   PT G CodesElray Mcgregor:        Sheikh Leverich 12/28/2015, 5:37 PM  Sheran Lawlessyndi Erminia Mcnew, PT [id number] 12/28/2015

## 2015-12-28 NOTE — Progress Notes (Signed)
Orthopedic Tech Progress Note Patient Details:  Brooke Davidson 1945/10/26 [id number]  CPM Right Knee CPM Right Knee: On Right Knee Flexion (Degrees): 90 Right Knee Extension (Degrees): 0 Additional Comments: trapeze bar patient helper Viewed order from doctor's order list  [name], Layton Naves 12/28/2015, 12:07 PM

## 2015-12-28 NOTE — Progress Notes (Signed)
Orthopedic Tech Progress Note Patient Details:  Brooke Davidson Apr 24, 1946 [id number] Patient off cpm at 2045 Patient ID: Brooke SalvoSandra H Davidson, female   DOB: Apr 24, 1946, 70 y.o.   MRN: [id number]   [name], [name] [name] 12/28/2015, 8:46 PM

## 2015-12-28 NOTE — Anesthesia Preprocedure Evaluation (Addendum)
Anesthesia Evaluation  Patient identified by MRN, date of birth, ID band Patient awake    Reviewed: Allergy & Precautions, H&P , NPO status , Patient's Chart, lab work & pertinent test results  Airway Mallampati: II   Neck ROM: full    Dental  (+) Partial Upper, Dental Advidsory Given   Pulmonary asthma , former smoker,    breath sounds clear to auscultation       Cardiovascular hypertension,  Rhythm:regular Rate:Normal     Neuro/Psych PSYCHIATRIC DISORDERS Depression Schizophrenia    GI/Hepatic GERD  ,  Endo/Other  diabetes, Type 2  Renal/GU      Musculoskeletal  (+) Arthritis ,   Abdominal   Peds  Hematology   Anesthesia Other Findings   Reproductive/Obstetrics                            Anesthesia Physical Anesthesia Plan  ASA: III  Anesthesia Plan: MAC, Regional and Spinal   Post-op Pain Management:    Induction: Intravenous  Airway Management Planned: Simple Face Mask  Additional Equipment:   Intra-op Plan:   Post-operative Plan:   Informed Consent: I have reviewed the patients History and Physical, chart, labs and discussed the procedure including the risks, benefits and alternatives for the proposed anesthesia with the patient or authorized representative who has indicated his/her understanding and acceptance.   Dental Advisory Given  Plan Discussed with: CRNA, Anesthesiologist and Surgeon  Anesthesia Plan Comments:        Anesthesia Quick Evaluation

## 2015-12-29 ENCOUNTER — Encounter (HOSPITAL_COMMUNITY): Payer: Self-pay | Admitting: Orthopedic Surgery

## 2015-12-29 LAB — BASIC METABOLIC PANEL
ANION GAP: 8 (ref 5–15)
BUN: 11 mg/dL (ref 6–20)
CALCIUM: 8.7 mg/dL — AB (ref 8.9–10.3)
CO2: 22 mmol/L (ref 22–32)
CREATININE: 0.96 mg/dL (ref 0.44–1.00)
Chloride: 107 mmol/L (ref 101–111)
GFR, EST NON AFRICAN AMERICAN: 59 mL/min — AB (ref >60)
Glucose, Bld: 249 mg/dL — ABNORMAL HIGH (ref 65–99)
Potassium: 4.7 mmol/L (ref 3.5–5.1)
SODIUM: 137 mmol/L (ref 135–145)

## 2015-12-29 LAB — GLUCOSE, CAPILLARY
GLUCOSE-CAPILLARY: 141 mg/dL — AB (ref 65–99)
GLUCOSE-CAPILLARY: 114 mg/dL — AB (ref 65–99)
GLUCOSE-CAPILLARY: 153 mg/dL — AB (ref 65–99)
Glucose-Capillary: 107 mg/dL — ABNORMAL HIGH (ref 65–99)

## 2015-12-29 LAB — CBC
HEMATOCRIT: 28.8 % — AB (ref 36.0–46.0)
Hemoglobin: 9.3 g/dL — ABNORMAL LOW (ref 12.0–15.0)
MCH: 27.1 pg (ref 26.0–34.0)
MCHC: 32.3 g/dL (ref 30.0–36.0)
MCV: 84 fL (ref 78.0–100.0)
Platelets: 262 10*3/uL (ref 150–400)
RBC: 3.43 MIL/uL — ABNORMAL LOW (ref 3.87–5.11)
RDW: 14.9 % (ref 11.5–15.5)
WBC: 10.4 10*3/uL (ref 4.0–10.5)

## 2015-12-29 MED ORDER — INSULIN ASPART 100 UNIT/ML ~~LOC~~ SOLN
4.0000 [IU] | Freq: Three times a day (TID) | SUBCUTANEOUS | Status: DC
  Administered 2015-12-29 – 2015-12-30 (×3): 4 [IU] via SUBCUTANEOUS

## 2015-12-29 MED ORDER — INSULIN GLARGINE 100 UNIT/ML ~~LOC~~ SOLN
25.0000 [IU] | Freq: Every day | SUBCUTANEOUS | Status: DC
  Administered 2015-12-29 – 2015-12-30 (×2): 25 [IU] via SUBCUTANEOUS
  Filled 2015-12-29 (×2): qty 0.25

## 2015-12-29 NOTE — Progress Notes (Signed)
Orthopedic Tech Progress Note Patient Details:  Brooke SalvoSandra H Davidson 1946/04/17 [id number]  Patient ID: Brooke SalvoSandra H Davidson, female   DOB: 1946/04/17, 70 y.o.   MRN: [id number] Placed pt's rle on cpm @0 -90 degrees @1430 ; pt acknowledged that she is comfortable in cpm; RN was also present  [name], Eliott Amparan 12/29/2015, 2:31 PM

## 2015-12-29 NOTE — Progress Notes (Signed)
Physical Therapy Treatment Patient Details Name: Glean SalvoSandra H Turck MRN: [id number] DOB: January 14, 1946 Today's Date: 12/29/2015    History of Present Illness Patient is a 70 y/o female admitted s/p R TKA.  PMH positive for L TKA 7/16, HTN, DM, HLD, Asthma, Glaucoma and Schizoaffective disorder.     PT Comments    Pt performed gait with straight cane to progress gait.  Pt required max VCs for safety as she remains impulsive and highly distracted.  Pt performed stair training with min guard assist and seated exercises.  Will f/u in am to continue treatment.    Follow Up Recommendations  SNF     Equipment Recommendations  None recommended by PT    Recommendations for Other Services       Precautions / Restrictions Precautions Precautions: Fall;Knee Required Braces or Orthoses: Knee Immobilizer - Right Knee Immobilizer - Right: On when out of bed or walking Restrictions Weight Bearing Restrictions: No RLE Weight Bearing: Weight bearing as tolerated    Mobility  Bed Mobility               General bed mobility comments: Pt standing in room leaned over recliner chair holding to railing.  PTA educated patient once again on safety and to use AD when up and moving.    Transfers Overall transfer level: Needs assistance Equipment used: Straight cane Transfers: Sit to/from Stand Sit to Stand: Supervision         General transfer comment: Cues for hand placement and cane placement to promote independence.  pt remains to require cueing for RLE advancement forward.    Ambulation/Gait Ambulation/Gait assistance: Min guard Ambulation Distance (Feet): 600 Feet Assistive device: Straight cane Gait Pattern/deviations: Step-to pattern;Antalgic;Decreased stride length Gait velocity: slow   General Gait Details: Cues for upright posture, sequencing with cane, increasing weight shifting to R and increasing stride length on L.  Pt performed with cues for safety as she moves cane back  and forth from R to L hand.  Pt educated to use cane in L hand for aid with stepping with LLE in reciprocal pattern.     Stairs Stairs: Yes Stairs assistance: Min guard Stair Management: One rail Right;With cane Number of Stairs: 2 General stair comments: Cues for sequencing and cane placement.  Pt require max VCs for safety.    Wheelchair Mobility    Modified Rankin (Stroke Patients Only)       Balance Overall balance assessment: No apparent balance deficits (not formally assessed)                                  Cognition Arousal/Alertness: Awake/alert Behavior During Therapy: WFL for tasks assessed/performed Overall Cognitive Status: Within Functional Limits for tasks assessed                      Exercises Total Joint Exercises Ankle Circles/Pumps: AROM;Both;10 reps;Supine Straight Leg Raises: AROM;Right;Supine;10 reps Long Arc Quad: AROM;Right;10 reps;Seated Marching in Standing: AROM;Right;10 reps;Seated   General Comments       Pertinent Vitals/Pain Pain Assessment: 0-10 Pain Score: 2  Pain Location: R knee Pain Descriptors / Indicators: Sore;Tightness Pain Intervention(s): Monitored during session;Repositioned    Home Living                      Prior Function            PT Goals (current goals can  now be found in the care plan section) Acute Rehab PT Goals Patient Stated Goal: To ride a stationary bike and eat salad. Potential to Achieve Goals: Good Progress towards PT goals: Progressing toward goals    Frequency  7X/week    PT Plan Current plan remains appropriate    Co-evaluation             End of Session Equipment Utilized During Treatment: Gait belt Activity Tolerance: Patient tolerated treatment well Patient left: with call bell/phone within reach;in chair;with chair alarm set     Time: 1610-9604 PT Time Calculation (min) (ACUTE ONLY): 32 min  Charges:  $Gait Training: 8-22 mins $Therapeutic  Exercise: 8-22 mins                    G Codes:      Florestine Avers 2016-01-09, 5:11 PM

## 2015-12-29 NOTE — Discharge Instructions (Addendum)
Information on my medicine - ELIQUIS® (apixaban) ° °This medication education was reviewed with me or my healthcare representative as part of my discharge preparation.  The pharmacist that spoke with me during my hospital stay was:  Kristiana Jacko A Jad Johansson, RPH ° °Why was Eliquis® prescribed for you? °Eliquis® was prescribed for you to reduce the risk of blood clots forming after orthopedic surgery.   ° °What do You need to know about Eliquis®? °Take your Eliquis® TWICE DAILY - one tablet in the morning and one tablet in the evening with or without food.  It would be best to take the dose about the same time each day. ° °If you have difficulty swallowing the tablet whole please discuss with your pharmacist how to take the medication safely. ° °Take Eliquis® exactly as prescribed by your doctor and DO NOT stop taking Eliquis® without talking to the doctor who prescribed the medication.  Stopping without other medication to take the place of Eliquis® may increase your risk of developing a clot. ° °After discharge, you should have regular check-up appointments with your healthcare provider that is prescribing your Eliquis®. ° °What do you do if you miss a dose? °If a dose of ELIQUIS® is not taken at the scheduled time, take it as soon as possible on the same day and twice-daily administration should be resumed.  The dose should not be doubled to make up for a missed dose.  Do not take more than one tablet of ELIQUIS at the same time. ° °Important Safety Information °A possible side effect of Eliquis® is bleeding. You should call your healthcare provider right away if you experience any of the following: °? Bleeding from an injury or your nose that does not stop. °? Unusual colored urine (red or dark brown) or unusual colored stools (red or black). °? Unusual bruising for unknown reasons. °? A serious fall or if you hit your head (even if there is no bleeding). ° °Some medicines may interact with Eliquis® and might increase  your risk of bleeding or clotting while on Eliquis®. To help avoid this, consult your healthcare provider or pharmacist prior to using any new prescription or non-prescription medications, including herbals, vitamins, non-steroidal anti-inflammatory drugs (NSAIDs) and supplements. ° °This website has more information on Eliquis® (apixaban): http://www.eliquis.com/eliquis/home ° °

## 2015-12-29 NOTE — Progress Notes (Signed)
   12/29/15 1415  Clinical Encounter Type  Visited With Patient  Visit Type Follow-up  Referral From Chaplain  Chaplain per the request of Chaplain Zachery DauerBarnes made an attempt to visit.  At time of visit patient receiving consult from nurses and other support staff.

## 2015-12-29 NOTE — Clinical Social Work Placement (Signed)
   CLINICAL SOCIAL WORK PLACEMENT  NOTE  Date:  12/29/2015  Patient Details  Name: Brooke Davidson MRN: [id number] Date of Birth: [birth year]  Clinical Social Work is seeking post-discharge placement for this patient at the Skilled  Nursing Facility level of care (*CSW will initial, date and re-position this form in  chart as items are completed):  Yes   Patient/family provided with [facility] Clinical Social Work Department's list of facilities offering this level of care within the geographic area requested by the patient (or if unable, by the patient's family).  Yes   Patient/family informed of their freedom to choose among providers that offer the needed level of care, that participate in Medicare, Medicaid or managed care program needed by the patient, have an available bed and are willing to accept the patient.  No   Patient/family informed of [facility]'s ownership interest [facility] and [facility], as well as of the fact that they are under no obligation to receive care at these facilities.  PASRR submitted to EDS on       PASRR number received on       Existing PASRR number confirmed on 12/29/15     FL2 transmitted to all facilities in geographic area requested by pt/family on 12/29/15     FL2 transmitted to all facilities within larger geographic area on       Patient informed that his/her managed care company has contracts with or will negotiate with certain facilities, including the following:            Patient/family informed of bed offers received.  Patient chooses bed at       Physician recommends and patient chooses bed at      Patient to be transferred to   on  .  Patient to be transferred to facility by       Patient family notified on   of transfer.  Name of family member notified:        PHYSICIAN       Additional Comment:    _______________________________________________ Cristobal Goldmannrawford, Randell Detter Bradley, LCSW 12/29/2015, 4:31 PM

## 2015-12-29 NOTE — Progress Notes (Addendum)
Occupational Therapy Evaluation Patient Details Name: Brooke SalvoSandra H [place] MRN: [id number] DOB: 1946/02/01 Today's Date: 12/29/2015    History of Present Illness Patient is a 70 y/o female admitted s/p R TKA.  PMH positive for L TKA 7/16, HTN, DM, HLD, Asthma, Glaucoma and Schizoaffective disorder.    Clinical Impression   Patient presents with decreased safety awareness s/p R TKA, but physically able to perform ADLs. She tends to leave walker behind and is getting up on her own, impulsive. OT will follow.    Follow Up Recommendations  SNF    Equipment Recommendations  None recommended by OT    Recommendations for Other Services       Precautions / Restrictions Precautions Precautions: Fall;Knee Required Braces or Orthoses: Knee Immobilizer - Right Knee Immobilizer - Right: On when out of bed or walking Restrictions Weight Bearing Restrictions: No RLE Weight Bearing: Weight bearing as tolerated      Mobility Bed Mobility               General bed mobility comments: NT -- OOB upon arrival  Transfers Overall transfer level: Needs assistance Equipment used: Rolling walker (2 wheeled) Transfers: Sit to/from Stand Sit to Stand: Supervision         General transfer comment: max cues for safety and to not leave RW behind    Balance                                            ADL Overall ADL's : Needs assistance/impaired Eating/Feeding: Independent;Sitting   Grooming: Wash/dry hands;Wash/dry face;Supervision/safety;Standing           Upper Body Dressing : Set up;Supervision/safety;Sitting;Standing   Lower Body Dressing: Supervision/safety;Sit to/from stand   Toilet Transfer: Supervision/safety;Ambulation;BSC;RW   Toileting- Clothing Manipulation and Hygiene: Supervision/safety;Sit to/from stand       Functional mobility during ADLs: Supervision/safety;Rolling walker General ADL Comments: Patient received ambulating without RW in  room without assistance. Educated patient on not getting up by herself and use of RW when she does ambulate. Patient needed reminders throughout session, frequently leaving RW behind. She is doing well physically but insists that she needs SNF at discharge due to living alone. Observed patient ambulating in room with RW gathering items, straightening her bed, and generally tidying up by moving things around and throwing things in trash can.      Vision     Perception     Praxis      Pertinent Vitals/Pain Pain Assessment: 0-10 Pain Score: 3  Pain Location: R knee Pain Descriptors / Indicators: Aching;Sore Pain Intervention(s): Limited activity within patient's tolerance;Monitored during session;Repositioned     Hand Dominance Right   Extremity/Trunk Assessment Upper Extremity Assessment Upper Extremity Assessment: Overall WFL for tasks assessed   Lower Extremity Assessment Lower Extremity Assessment: Defer to PT evaluation       Communication Communication Communication: No difficulties   Cognition Arousal/Alertness: Awake/alert Behavior During Therapy: WFL for tasks assessed/performed Overall Cognitive Status: Within Functional Limits for tasks assessed                     General Comments       Exercises       Shoulder Instructions      Home Living Family/patient expects to be discharged to:: Skilled nursing facility Living Arrangements: Alone   Type of Home: House Home Access: Level  entry     Home Layout: One level               Home Equipment: Cane - single point;Walker - 4 wheels          Prior Functioning/Environment Level of Independence: Independent with assistive device(s)        Comments: reports used rollator or cane at home, states had difficutly with curbs and getting into her SUV    OT Diagnosis: Acute pain   OT Problem List: Decreased strength;Decreased range of motion;Pain;Decreased safety awareness   OT  Treatment/Interventions: Self-care/ADL training;DME and/or AE instruction;Therapeutic activities;Patient/family education    OT Goals(Current goals can be found in the care plan section) Acute Rehab OT Goals Patient Stated Goal: To go to [facility] then to home OT Goal Formulation: With patient Time For Goal Achievement: 01/12/16 Potential to Achieve Goals: Good  OT Frequency: Min 2X/week   Barriers to D/C: Decreased caregiver support  lives alone       Co-evaluation              End of Session Equipment Utilized During Treatment: Rolling walker  Activity Tolerance: Patient tolerated treatment well Patient left: in chair;with call bell/phone within reach   Time: 0957-1022 OT Time Calculation (min): 25 min Charges:  OT General Charges $OT Visit: 1 Procedure OT Evaluation $OT Eval Low Complexity: 1 Procedure OT Treatments $Self Care/Home Management : 8-22 mins G-Codes:    Genevive Printup A 12/30/2015, 10:30 AM

## 2015-12-29 NOTE — Progress Notes (Signed)
Physical Therapy Treatment Patient Details Name: Brooke [facility] MRN: [id number] DOB: [birth year] Today's Date: 12/29/2015    History of Present Illness Patient is a 70 y/o female admitted s/p R TKA.  PMH positive for L TKA 7/16, HTN, DM, HLD, Asthma, Glaucoma and Schizoaffective disorder.     PT Comments    Pt performed increased mobility including increased gait distance and reviewing HEP for accuracy.  Pt motivated to push her self when focused.  Pt required increased time to gain focus after performing morning routine(including brushing teeth and cleaning her room.) Pt not safe with RW but appears to be baseline functioning.  Pt would benefit from skilled rehab to improve her safety before d/c home.    Follow Up Recommendations  SNF     Equipment Recommendations  None recommended by PT    Recommendations for Other Services       Precautions / Restrictions Precautions Precautions: Fall;Knee Required Braces or Orthoses: Knee Immobilizer - Right Knee Immobilizer - Right: On when out of bed or walking Restrictions Weight Bearing Restrictions: No RLE Weight Bearing: Weight bearing as tolerated    Mobility  Bed Mobility               General bed mobility comments: Pt sitting in chair on arrival undressed.  PTA assited patient with SBA during dressing.    Transfers Overall transfer level: Needs assistance Equipment used: Rolling walker (2 wheeled) Transfers: Sit to/from Stand Sit to Stand: Supervision         General transfer comment: MAX VCs for safety.  pt is impulsive and stands without RW and grabbing to furniture for support.  pt presents with decreased ability to focus on task.    Ambulation/Gait Ambulation/Gait assistance: Supervision Ambulation Distance (Feet): 300 Feet Assistive device: Rolling walker (2 wheeled) Gait Pattern/deviations: Step-through pattern;Decreased stride length;Antalgic Gait velocity: slow   General Gait Details: Cues for upright  posture, RW position and R heel strike/ toe off.  Pt presents with good endurance.     Stairs            Wheelchair Mobility    Modified Rankin (Stroke Patients Only)       Balance Overall balance assessment: No apparent balance deficits (not formally assessed)                                  Cognition Arousal/Alertness: Awake/alert Behavior During Therapy: WFL for tasks assessed/performed Overall Cognitive Status: Within Functional Limits for tasks assessed                      Exercises Total Joint Exercises Ankle Circles/Pumps: AROM;Both;10 reps;Supine Quad Sets: AROM;Right;10 reps;Supine Towel Squeeze: AROM;Right;10 reps;Supine Short Arc Quad: AROM;Right;10 reps;Supine Heel Slides: AROM;Right;10 reps;Supine Hip ABduction/ADduction: AROM;Right;10 reps;Supine Straight Leg Raises: AROM;Right;Supine Long Arc Quad: AROM;Right;10 reps;Seated Goniometric ROM: 2-84 degrees.      General Comments General comments (skin integrity, edema, etc.): Pt picking up items from floor and making bed, washing hands at sink, reaching for linen, brushing teeth.  All unassisted with no LOB.  Pt observed stepping in trashcan to push paper towels down.  PTA educated this is not safe but no LOB noted.        Pertinent Vitals/Pain Pain Assessment: 0-10 Pain Score: 3  Pain Location: R knee Pain Descriptors / Indicators: Sore;Tightness Pain Intervention(s): Monitored during session;Repositioned;Ice applied    Home Living Family/patient expects to  be discharged to:: Skilled nursing facility Living Arrangements: Alone   Type of Home: House Home Access: Level entry   Home Layout: One level Home Equipment: Cane - single point;Walker - 4 wheels      Prior Function Level of Independence: Independent with assistive device(s)      Comments: reports used rollator or cane at home, states had difficutly with curbs and getting into her SUV   PT Goals (current goals  can now be found in the care plan section) Acute Rehab PT Goals Patient Stated [facility]al[facility] to home Potential to Achieve Goals: Good Progress towards PT goals: Progressing toward goals    Frequency  7X/week    PT Plan      Co-evaluation             End of Session Equipment Utilized During Treatment: Gait belt Activity Tolerance: Patient tolerated treatment well Patient left: with call bell/phone within reach;in chair;with chair alarm set     Time: [id number] PT Time Calculation (min) (ACUTE ONLY): 49 min  Charges:  $Gait Training: 8-22 mins $Therapeutic Exercise: 8-22 mins $Therapeutic Activity: 8-22 mins                    G Codes:      Florestine Aversimee J Pheonix Wisby 12/29/2015, 1:37 PM  Joycelyn RuaAimee Hila Bolding, PTA pager [phone]

## 2015-12-29 NOTE — Progress Notes (Signed)
Pt informed RN and Chiropodistassistant director Marcelino DusterMichelle that she has not been seen by therapy and they haven't worked with her since she's been on the unit. PT Aimee was contacted as follow up based on pt complaints but Aimee stated pt informed them for PT to come back in the evening. AD Marcelino DusterMichelle informed. Dionne BucyP. Amo Reina Wilton RN

## 2015-12-29 NOTE — Clinical Social Work Note (Signed)
Clinical Social Work Assessment  Patient Details  Name: Brooke [facility] MRN: [id number] Date of Birth: 1946-03-30  Date of referral:  12/29/15               Reason for consult:  [facility] Placement                Permission sought to share information with:  [facility] Contact Representative Permission granted to share information::  Yes, Verbal Permission Granted  Name::        Agency::  [facility]  Relationship::     Contact Information:     Housing/Transportation Living arrangements for the past 2 months:  Apartment Source of Information:  Patient Patient Interpreter Needed:  None Criminal Activity/Legal Involvement Pertinent to Current Situation/Hospitalization:  No - Comment as needed Significant Relationships:  Adult Children Lives with:  Self Do you feel safe going back to the [facility] where you live?  Yes (Patient feels safe at home, but in agreement [facility]) Need for family participation in patient care:  No (Coment)  Care giving concerns:  Patient reported that she visited and checked out [facility] before coming to hospital and wants this [facility] for ST rehab.  Social Worker assessment / plan:  CSW talked with patient regarding discharge planning and ST rehab. Patient aware of her need for rehab and has visited [facility] and chosen this [facility].  Employment status:  Retired Office managernsurance information:  Managed Medicare ([employer]) PT Recommendations:  [facility] Information / Referral to community resources:  [facility] (Patient provided with list of [facility])  Patient/Family's Response to care:  No concerns expressed regarding care during [facility]. Patient did express concern regarding the cleanliness of her room at admission.  Patient/Family's Understanding of and Emotional Response to Diagnosis, Current Treatment, and Prognosis:  Not discussed.  Emotional Assessment Appearance:  Appears younger than stated  age Attitude/Demeanor/Rapport:  Other (Appropriate) Affect (typically observed):  Appropriate Orientation:  Oriented to Self, Oriented to [facility], Oriented to  Time, Oriented to Situation Alcohol / Substance use:  Tobacco Use, Alcohol Use, Illicit Drugs (Patient reported that she quit smoking, does not consume alcohol or use illicit drugs.) Psych involvement (Current and /or in the community):  No (Comment)  Discharge Needs  Concerns to be addressed:  Discharge Planning Concerns Readmission within the last 30 days:  Yes Current discharge risk:  None Barriers to Discharge:  No Barriers Identified   [facility], Brooke Depaul Bradley, LCSW 12/29/2015, 4:27 PM

## 2015-12-29 NOTE — Progress Notes (Signed)
   12/29/15 1127  Clinical Encounter Type  Visited With Patient not available;Health care provider  Visit Type Initial  Referral From Nurse   Chaplain attempted to meet with patient, but patient is currently working with therapy. Chaplain will seek to follow-up. Spiritual care services available as needed.   Alda PonderAdam M Joclynn Lumb, Chaplain 12/29/2015 11:28 AM

## 2015-12-29 NOTE — Anesthesia Postprocedure Evaluation (Signed)
Anesthesia Post Note  Patient: Brooke [facility]  Procedure(s) Performed: Procedure(s) (LRB): TOTAL KNEE ARTHROPLASTY (Right)  Patient location during evaluation: PACU Anesthesia Type: Spinal Level of consciousness: oriented and awake and alert Pain management: pain level controlled Vital Signs Assessment: post-procedure vital signs reviewed and stable Respiratory status: spontaneous breathing, respiratory function stable and patient connected to nasal cannula oxygen Cardiovascular status: blood pressure returned to baseline and stable Postop Assessment: no headache and no backache Anesthetic complications: no    Last Vitals:  Filed Vitals:   12/29/15 0430 12/29/15 1451  BP: 143/93 129/63  Pulse: 79 82  Temp: 37.1 C 36.9 C  Resp: 16 18    Last Pain:  Filed Vitals:   12/29/15 1451  PainSc: 10-Worst pain ever                 Hassel Uphoff S

## 2015-12-29 NOTE — Progress Notes (Signed)
Subjective: 1 Day Post-Op Procedure(s) (LRB): TOTAL KNEE ARTHROPLASTY (Right) Patient reports pain as 6 on 0-10 scale.   This patient is very anxious and impulsive.  She is very unsafe with her movements often getting up without assistance.  Objective: Vital signs in last 24 hours: Temp:  [97 F (36.1 C)-98.7 F (37.1 C)] 98.7 F (37.1 C) (07/18 0430) Pulse Rate:  [50-79] 79 (07/18 0430) Resp:  [9-20] 16 (07/18 0430) BP: (116-146)/(70-94) 143/93 mmHg (07/18 0430) SpO2:  [93 %-100 %] 100 % (07/18 0430)  Intake/Output from previous day: 07/17 0701 - 07/18 0700 In: 1810 [P.O.:510; I.V.:1300] Out: 2355 [Urine:2350; Blood:5] Intake/Output this shift:     Recent Labs  12/29/15 0517  HGB 9.3*    Recent Labs  12/29/15 0517  WBC 10.4  RBC 3.43*  HCT 28.8*  PLT 262    Recent Labs  12/29/15 0517  NA 137  K 4.7  CL 107  CO2 22  BUN 11  CREATININE 0.96  GLUCOSE 249*  CALCIUM 8.7*   No results for input(s): LABPT, INR in the last 72 hours.  ABD soft Neurovascular intact Sensation intact distally Incision: dressing C/D/I  Assessment/Plan: 1 Day Post-Op Procedure(s) (LRB): TOTAL KNEE ARTHROPLASTY (Right)  Principal Problem:   Primary localized osteoarthritis of right knee Active Problems:   Schizoaffective disorder (HCC)   Diabetes mellitus without complication (HCC)   Hypertension   HLD (hyperlipidemia)   Asthma   Pulmonary nodule   Glaucoma   Insomnia   Contact dermatitis due to chemicals  Advance diet Up with therapy Discharge to SNF when approved by insurance.  Blane Worthington J 12/29/2015, 9:38 AM

## 2015-12-29 NOTE — NC FL2 (Signed)
[place] MEDICAID FL2 LEVEL OF CARE SCREENING TOOL     IDENTIFICATION  Patient Name: Brooke [facility] Birthdate: 09/03/1945 Sex: female Admission Date (Current Location): 12/28/2015  [facility] [place]   Facility and Address:         Provider Number: [id number]  Attending Physician Name and Address:  Salvatore Marvelobert Wainer, MD  Relative Name and Phone Number:       Current Level of Care:   Recommended Level of Care:   Prior Approval Number:    Date Approved/Denied:   PASRR Number: [id number] (Eff. 01/06/15)  Discharge Plan: SNF    Current Diagnoses: Patient Active Problem List   Diagnosis Date Noted  . Diverticulitis of colon 12/18/2015  . Primary localized osteoarthritis of right knee 12/17/2015  . Contact dermatitis due to chemicals   . DJD (degenerative joint disease) of knee 01/05/2015  . Primary localized osteoarthritis of left knee   . Osteoarthritis   . Schizoaffective disorder (HCC)   . Diabetes mellitus without complication (HCC)   . Hypertension   . HLD (hyperlipidemia)   . Asthma   . Pulmonary nodule   . Glaucoma   . Insomnia   . Well woman exam with routine gynecological exam 08/04/2014    Orientation RESPIRATION BLADDER Height & Weight     Self, Time, Situation  Normal Continent (Has a urinary catheter) Weight: 172 lb 9 oz (78.274 kg) Height:  5\' 4"  (162.6 cm)  BEHAVIORAL SYMPTOMS/MOOD NEUROLOGICAL BOWEL NUTRITION STATUS      Continent Diet (Heart Healthy)  AMBULATORY STATUS COMMUNICATION OF NEEDS Skin   Limited Assist   Normal, Other (Comment) (Incision, right legt)                       Personal Care Assistance Level of Assistance  Bathing, Feeding, Dressing Bathing Assistance: Limited assistance (Supervision) Feeding assistance: Independent Dressing Assistance: Limited assistance (Supervision)     Functional Limitations Info  Sight, Hearing, Speech Sight Info: Adequate Hearing Info: Adequate Speech Info:  Adequate    SPECIAL CARE FACTORS FREQUENCY  PT (By licensed PT), OT (By licensed OT)     PT Frequency: Evaluation 7/17 and a minimum of 7X per week therapy recommended. OT Frequency: Evaluated 7/18 and a minimum of 2X per week therapy recommended            Contractures Contractures Info: Not present    Additional Factors Info  Insulin Sliding Scale, Code Status, Allergies Code Status Info: Full Allergies Info: Bactrim, Ivp Dye, Clonazepam, Iodine, Sulfa Antibiotics, Codeine, Vicodin   Insulin Sliding Scale Info: 0-5 Units daily at bedtime. 0-15 Units 3X per day with meals       Current Medications (12/29/2015):  This is the current hospital active medication list Current Facility-Administered Medications  Medication Dose Route Frequency Provider Last Rate Last Dose  . 0.9 % NaCl with KCl 20 mEq/ L  infusion   Intravenous Continuous Kirstin Shepperson, PA-C 100 mL/hr at 12/28/15 2314 1,000 mL at 12/28/15 2314  . acetaminophen (TYLENOL) tablet 650 mg  650 mg Oral Q6H PRN Kirstin Shepperson, PA-C       Or  . acetaminophen (TYLENOL) suppository 650 mg  650 mg Rectal Q6H PRN Kirstin Shepperson, PA-C      . albuterol (PROVENTIL) (2.5 MG/3ML) 0.083% nebulizer solution 2.5 mg  2.5 mg Inhalation Q4H PRN Kirstin Shepperson, PA-C      . alum & mag hydroxide-simeth (MAALOX/MYLANTA) 200-200-20 MG/5ML suspension 30 mL  30  mL Oral Q4H PRN Kirstin Shepperson, PA-C      . apixaban (ELIQUIS) tablet 2.5 mg  2.5 mg Oral Q12H Kirstin Shepperson, PA-C   2.5 mg at 12/29/15 1610  . dexamethasone (DECADRON) injection 10 mg  10 mg Intravenous Q8H Kirstin Shepperson, PA-C   10 mg at 12/29/15 9604  . diphenhydrAMINE (BENADRYL) 12.5 MG/5ML elixir 12.5-25 mg  12.5-25 mg Oral Q4H PRN Kirstin Shepperson, PA-C      . docusate sodium (COLACE) capsule 100 mg  100 mg Oral BID Kirstin Shepperson, PA-C   100 mg at 12/29/15 5409  . [START ON 12/30/2015] losartan (COZAAR) tablet 50 mg  50 mg Oral Daily Salvatore Marvel,  MD       And  . Melene Muller ON 12/30/2015] hydrochlorothiazide 10 mg/mL oral suspension 6.25 mg  6.25 mg Oral Daily Salvatore Marvel, MD      . HYDROmorphone (DILAUDID) injection 0.5-1 mg  0.5-1 mg Intravenous Q2H PRN Kirstin Shepperson, PA-C   0.5 mg at 12/28/15 2043  . insulin aspart (novoLOG) injection 0-15 Units  0-15 Units Subcutaneous TID WC Kirstin Shepperson, PA-C   2 Units at 12/29/15 0923  . insulin aspart (novoLOG) injection 0-5 Units  0-5 Units Subcutaneous QHS Kirstin Shepperson, PA-C   0 Units at 12/28/15 2200  . insulin aspart (novoLOG) injection 4 Units  4 Units Subcutaneous TID WC Kirstin Shepperson, PA-C   4 Units at 12/29/15 0923  . insulin glargine (LANTUS) injection 25 Units  25 Units Subcutaneous QHS Kirstin Shepperson, PA-C      . latanoprost (XALATAN) 0.005 % ophthalmic solution 1 drop  1 drop Both Eyes QHS Kirstin Shepperson, PA-C   1 drop at 12/28/15 2300  . loratadine (CLARITIN) tablet 10 mg  10 mg Oral Daily Kirstin Shepperson, PA-C   10 mg at 12/29/15 8119  . LORazepam (ATIVAN) tablet 0.5 mg  0.5 mg Oral Q6H PRN Kirstin Shepperson, PA-C   0.5 mg at 12/29/15 0839   Or  . LORazepam (ATIVAN) injection 0.5 mg  0.5 mg Intramuscular Q6H PRN Kirstin Shepperson, PA-C      . menthol-cetylpyridinium (CEPACOL) lozenge 3 mg  1 lozenge Oral PRN Kirstin Shepperson, PA-C       Or  . phenol (CHLORASEPTIC) mouth spray 1 spray  1 spray Mouth/Throat PRN Kirstin Shepperson, PA-C      . metoCLOPramide (REGLAN) tablet 5-10 mg  5-10 mg Oral Q8H PRN Kirstin Shepperson, PA-C       Or  . metoCLOPramide (REGLAN) injection 5-10 mg  5-10 mg Intravenous Q8H PRN Kirstin Shepperson, PA-C      . mometasone-formoterol (DULERA) 100-5 MCG/ACT inhaler 2 puff  2 puff Inhalation BID Kirstin Shepperson, PA-C   2 puff at 12/28/15 2000  . ondansetron (ZOFRAN) tablet 4 mg  4 mg Oral Q6H PRN Kirstin Shepperson, PA-C       Or  . ondansetron (ZOFRAN) injection 4 mg  4 mg Intravenous Q6H PRN Kirstin Shepperson, PA-C       . oxyCODONE (Oxy IR/ROXICODONE) immediate release tablet 5-10 mg  5-10 mg Oral Q3H PRN Kirstin Shepperson, PA-C   10 mg at 12/28/15 1428  . polyethylene glycol (MIRALAX / GLYCOLAX) packet 17 g  17 g Oral BID Kirstin Shepperson, PA-C   17 g at 12/29/15 1478  . pravastatin (PRAVACHOL) tablet 80 mg  80 mg Oral QHS Kirstin Shepperson, PA-C   80 mg at 12/28/15 2135  . zolpidem (AMBIEN) tablet 5 mg  5 mg Oral QHS Kirstin Shepperson, PA-C  5 mg at 12/28/15 2135     Discharge Medications: Please see discharge summary for a list of discharge medications.  Relevant Imaging Results:  Relevant Lab Results:   Additional Information ss#[id number].   Cristobal Goldmann, LCSW

## 2015-12-29 NOTE — Care Management Important Message (Signed)
Important Message  Patient Details  Name: Glean SalvoSandra H [place] MRN: [id number] Date of Birth: 1946/05/06   Medicare Important Message Given:  Yes    Bernadette HoitShoffner, Reznor Ferrando Coleman 12/29/2015, 8:04 AM

## 2015-12-30 ENCOUNTER — Encounter (HOSPITAL_COMMUNITY)
Admission: RE | Disposition: A | Discharge status: Skilled Nursing Facility | Payer: Self-pay | Source: Ambulatory Visit | Attending: Orthopedic Surgery

## 2015-12-30 ENCOUNTER — Inpatient Hospital Stay (HOSPITAL_COMMUNITY): Payer: Medicare HMO | Admitting: Certified Registered Nurse Anesthetist

## 2015-12-30 ENCOUNTER — Inpatient Hospital Stay (HOSPITAL_COMMUNITY): Payer: Medicare HMO

## 2015-12-30 DIAGNOSIS — W19XXXA Unspecified fall, initial encounter: Secondary | ICD-10-CM

## 2015-12-30 DIAGNOSIS — Y92239 Unspecified place in hospital as the place of occurrence of the external cause: Secondary | ICD-10-CM

## 2015-12-30 HISTORY — PX: IRRIGATION AND DEBRIDEMENT KNEE: SHX5185

## 2015-12-30 HISTORY — PX: PATELLAR TENDON REPAIR: SHX737

## 2015-12-30 HISTORY — DX: Unspecified fall, initial encounter: W19.XXXA

## 2015-12-30 LAB — GLUCOSE, CAPILLARY
GLUCOSE-CAPILLARY: 124 mg/dL — AB (ref 65–99)
GLUCOSE-CAPILLARY: 121 mg/dL — AB (ref 65–99)
GLUCOSE-CAPILLARY: 85 mg/dL (ref 65–99)
GLUCOSE-CAPILLARY: 73 mg/dL (ref 65–99)
Glucose-Capillary: 201 mg/dL — ABNORMAL HIGH (ref 65–99)
Glucose-Capillary: 89 mg/dL (ref 65–99)

## 2015-12-30 LAB — BASIC METABOLIC PANEL
ANION GAP: 8 (ref 5–15)
BUN: 20 mg/dL (ref 6–20)
CHLORIDE: 106 mmol/L (ref 101–111)
CO2: 22 mmol/L (ref 22–32)
Calcium: 8.9 mg/dL (ref 8.9–10.3)
Creatinine, Ser: 1.08 mg/dL — ABNORMAL HIGH (ref 0.44–1.00)
GFR calc non Af Amer: 52 mL/min — ABNORMAL LOW (ref >60)
GFR, EST AFRICAN AMERICAN: 60 mL/min — AB (ref >60)
GLUCOSE: 132 mg/dL — AB (ref 65–99)
POTASSIUM: 4.8 mmol/L (ref 3.5–5.1)
Sodium: 136 mmol/L (ref 135–145)

## 2015-12-30 LAB — CBC
HEMATOCRIT: 26.3 % — AB (ref 36.0–46.0)
HEMOGLOBIN: 8.9 g/dL — AB (ref 12.0–15.0)
MCH: 27.7 pg (ref 26.0–34.0)
MCHC: 33.8 g/dL (ref 30.0–36.0)
MCV: 81.9 fL (ref 78.0–100.0)
Platelets: 240 10*3/uL (ref 150–400)
RBC: 3.21 MIL/uL — ABNORMAL LOW (ref 3.87–5.11)
RDW: 15 % (ref 11.5–15.5)
WBC: 13.7 10*3/uL — AB (ref 4.0–10.5)

## 2015-12-30 SURGERY — REPAIR, TENDON, PATELLAR
Anesthesia: General | Site: Knee | Laterality: Right

## 2015-12-30 MED ORDER — MIDAZOLAM HCL 2 MG/2ML IJ SOLN
INTRAMUSCULAR | Status: DC | PRN
  Administered 2015-12-30: 2 mg via INTRAVENOUS

## 2015-12-30 MED ORDER — FENTANYL CITRATE (PF) 250 MCG/5ML IJ SOLN
INTRAMUSCULAR | Status: AC
  Filled 2015-12-30: qty 5

## 2015-12-30 MED ORDER — PROMETHAZINE HCL 25 MG/ML IJ SOLN: 6.2500 mg | INTRAMUSCULAR | Status: DC | PRN

## 2015-12-30 MED ORDER — LIDOCAINE 2% (20 MG/ML) 5 ML SYRINGE
INTRAMUSCULAR | Status: DC | PRN
  Administered 2015-12-30: 60 mg via INTRAVENOUS

## 2015-12-30 MED ORDER — 0.9 % SODIUM CHLORIDE (POUR BTL) OPTIME
TOPICAL | Status: DC | PRN
Start: 2015-12-30 — End: 2015-12-30
  Administered 2015-12-30: 1000 mL

## 2015-12-30 MED ORDER — EPHEDRINE SULFATE-NACL 50-0.9 MG/10ML-% IV SOSY
PREFILLED_SYRINGE | INTRAVENOUS | Status: DC | PRN
  Administered 2015-12-30: 10 mg via INTRAVENOUS

## 2015-12-30 MED ORDER — HYDROMORPHONE HCL 1 MG/ML IJ SOLN
INTRAMUSCULAR | Status: AC
  Administered 2015-12-30: 0.5 mg via INTRAVENOUS
  Filled 2015-12-30: qty 1

## 2015-12-30 MED ORDER — ONDANSETRON HCL 4 MG/2ML IJ SOLN
INTRAMUSCULAR | Status: DC | PRN
  Administered 2015-12-30: 4 mg via INTRAVENOUS

## 2015-12-30 MED ORDER — LACTATED RINGERS IV SOLN: INTRAVENOUS | Status: DC

## 2015-12-30 MED ORDER — ROCURONIUM BROMIDE 100 MG/10ML IV SOLN
INTRAVENOUS | Status: DC | PRN
  Administered 2015-12-30: 50 mg via INTRAVENOUS
  Administered 2015-12-30: 10 mg via INTRAVENOUS

## 2015-12-30 MED ORDER — ONDANSETRON HCL 4 MG/2ML IJ SOLN
INTRAMUSCULAR | Status: AC
  Filled 2015-12-30: qty 2

## 2015-12-30 MED ORDER — MIDAZOLAM HCL 2 MG/2ML IJ SOLN
INTRAMUSCULAR | Status: AC
  Filled 2015-12-30: qty 4

## 2015-12-30 MED ORDER — CEFAZOLIN SODIUM-DEXTROSE 2-4 GM/100ML-% IV SOLN
2.0000 g | Freq: Four times a day (QID) | INTRAVENOUS | Status: DC
  Administered 2015-12-30 – 2015-12-31 (×4): 2 g via INTRAVENOUS
  Filled 2015-12-30 (×7): qty 100

## 2015-12-30 MED ORDER — SODIUM CHLORIDE 0.9 % IR SOLN
Status: DC | PRN
  Administered 2015-12-30: 1

## 2015-12-30 MED ORDER — PROPOFOL 10 MG/ML IV BOLUS
INTRAVENOUS | Status: DC | PRN
  Administered 2015-12-30: 130 mg via INTRAVENOUS

## 2015-12-30 MED ORDER — LIDOCAINE HCL (PF) 1 % IJ SOLN
INTRAMUSCULAR | Status: AC
  Filled 2015-12-30: qty 15

## 2015-12-30 MED ORDER — LORAZEPAM 2 MG/ML IJ SOLN: 0.5000 mg | Freq: Four times a day (QID) | INTRAMUSCULAR | Status: DC

## 2015-12-30 MED ORDER — LIDOCAINE HCL (PF) 1 % IJ SOLN
INTRAMUSCULAR | Status: AC
  Filled 2015-12-30: qty 5

## 2015-12-30 MED ORDER — LORAZEPAM 0.5 MG PO TABS
0.5000 mg | ORAL_TABLET | Freq: Four times a day (QID) | ORAL | Status: DC
  Administered 2015-12-30 – 2015-12-31 (×4): 0.5 mg via ORAL
  Filled 2015-12-30 (×4): qty 1

## 2015-12-30 MED ORDER — HYDROMORPHONE HCL 1 MG/ML IJ SOLN
0.2500 mg | INTRAMUSCULAR | Status: DC | PRN
  Administered 2015-12-30 (×3): 0.5 mg via INTRAVENOUS

## 2015-12-30 MED ORDER — SUGAMMADEX SODIUM 200 MG/2ML IV SOLN
INTRAVENOUS | Status: AC
  Filled 2015-12-30: qty 2

## 2015-12-30 MED ORDER — MEPERIDINE HCL 25 MG/ML IJ SOLN: 6.2500 mg | INTRAMUSCULAR | Status: DC | PRN

## 2015-12-30 MED ORDER — SUGAMMADEX SODIUM 200 MG/2ML IV SOLN
INTRAVENOUS | Status: DC | PRN
  Administered 2015-12-30: 160 mg via INTRAVENOUS

## 2015-12-30 MED ORDER — FENTANYL CITRATE (PF) 250 MCG/5ML IJ SOLN
INTRAMUSCULAR | Status: DC | PRN
  Administered 2015-12-30: 50 ug via INTRAVENOUS
  Administered 2015-12-30: 100 ug via INTRAVENOUS
  Administered 2015-12-30: 25 ug via INTRAVENOUS

## 2015-12-30 MED ORDER — BUPIVACAINE HCL (PF) 0.25 % IJ SOLN
INTRAMUSCULAR | Status: AC
  Filled 2015-12-30: qty 30

## 2015-12-30 MED ORDER — SODIUM CHLORIDE 0.9 % IR SOLN
Status: DC | PRN
  Administered 2015-12-30: 3000 mL

## 2015-12-30 MED ORDER — SODIUM CHLORIDE 0.9 % IR SOLN
Status: DC | PRN
Start: 2015-12-30 — End: 2015-12-30
  Administered 2015-12-30: 3000 mL

## 2015-12-30 MED ORDER — LACTATED RINGERS IV SOLN
INTRAVENOUS | Status: DC
  Administered 2015-12-30 (×2): via INTRAVENOUS

## 2015-12-30 SURGICAL SUPPLY — 57 items
BANDAGE ACE 6X5 VEL STRL LF (GAUZE/BANDAGES/DRESSINGS) ×2 IMPLANT
BANDAGE ELASTIC 4 VELCRO ST LF (GAUZE/BANDAGES/DRESSINGS) ×2 IMPLANT
BANDAGE ELASTIC 6 VELCRO ST LF (GAUZE/BANDAGES/DRESSINGS) ×2 IMPLANT
BANDAGE ESMARK 6X9 LF (GAUZE/BANDAGES/DRESSINGS) ×1 IMPLANT
BLADE SURG 10 STRL SS (BLADE) ×2 IMPLANT
BNDG ESMARK 6X9 LF (GAUZE/BANDAGES/DRESSINGS) ×2
BNDG GAUZE ELAST 4 BULKY (GAUZE/BANDAGES/DRESSINGS) ×2 IMPLANT
COVER MAYO STAND STRL (DRAPES) ×2 IMPLANT
COVER SURGICAL LIGHT HANDLE (MISCELLANEOUS) ×2 IMPLANT
DRAPE U-SHAPE 47X51 STRL (DRAPES) ×2 IMPLANT
DRSG PAD ABDOMINAL 8X10 ST (GAUZE/BANDAGES/DRESSINGS) ×2 IMPLANT
DURAPREP 26ML APPLICATOR (WOUND CARE) ×2 IMPLANT
ELECT REM PT RETURN 9FT ADLT (ELECTROSURGICAL) ×2
ELECTRODE REM PT RTRN 9FT ADLT (ELECTROSURGICAL) ×1 IMPLANT
GAUZE SPONGE 4X4 12PLY STRL (GAUZE/BANDAGES/DRESSINGS) ×2 IMPLANT
GAUZE XEROFORM 1X8 LF (GAUZE/BANDAGES/DRESSINGS) ×4 IMPLANT
GAUZE XEROFORM 5X9 LF (GAUZE/BANDAGES/DRESSINGS) ×2 IMPLANT
GLOVE BIO SURGEON STRL SZ7 (GLOVE) ×2 IMPLANT
GLOVE BIOGEL PI IND STRL 7.0 (GLOVE) ×1 IMPLANT
GLOVE BIOGEL PI IND STRL 7.5 (GLOVE) ×1 IMPLANT
GLOVE BIOGEL PI INDICATOR 7.0 (GLOVE) ×1
GLOVE BIOGEL PI INDICATOR 7.5 (GLOVE) ×1
GLOVE SS BIOGEL STRL SZ 7.5 (GLOVE) ×1 IMPLANT
GLOVE SUPERSENSE BIOGEL SZ 7.5 (GLOVE) ×1
GOWN STRL REUS W/ TWL LRG LVL3 (GOWN DISPOSABLE) ×3 IMPLANT
GOWN STRL REUS W/TWL LRG LVL3 (GOWN DISPOSABLE) ×3
KIT BASIN OR (CUSTOM PROCEDURE TRAY) ×2 IMPLANT
KIT ROOM TURNOVER OR (KITS) ×2 IMPLANT
MANIFOLD NEPTUNE II (INSTRUMENTS) ×2 IMPLANT
NS IRRIG 1000ML POUR BTL (IV SOLUTION) ×2 IMPLANT
PACK ORTHO EXTREMITY (CUSTOM PROCEDURE TRAY) ×2 IMPLANT
PAD ABD 8X10 STRL (GAUZE/BANDAGES/DRESSINGS) ×2 IMPLANT
PAD ARMBOARD 7.5X6 YLW CONV (MISCELLANEOUS) ×4 IMPLANT
PAD CAST 4YDX4 CTTN HI CHSV (CAST SUPPLIES) ×2 IMPLANT
PADDING CAST COTTON 4X4 STRL (CAST SUPPLIES) ×2
PADDING CAST COTTON 6X4 STRL (CAST SUPPLIES) ×2 IMPLANT
RETRIEVER SUT HEWSON (MISCELLANEOUS) ×2 IMPLANT
SPONGE LAP 4X18 X RAY DECT (DISPOSABLE) ×4 IMPLANT
SPONGE SCRUB IODOPHOR (GAUZE/BANDAGES/DRESSINGS) ×2 IMPLANT
STAPLER VISISTAT 35W (STAPLE) ×2 IMPLANT
STRIP CLOSURE SKIN 1/2X4 (GAUZE/BANDAGES/DRESSINGS) ×2 IMPLANT
SUCTION FRAZIER HANDLE 10FR (MISCELLANEOUS) ×1
SUCTION TUBE FRAZIER 10FR DISP (MISCELLANEOUS) ×1 IMPLANT
SUT ETHILON 2 0 FS 18 (SUTURE) ×2 IMPLANT
SUT FIBERWIRE #2 38 T-5 BLUE (SUTURE) ×8
SUT VIC AB 0 CT1 27 (SUTURE) ×2
SUT VIC AB 0 CT1 27XBRD ANBCTR (SUTURE) ×2 IMPLANT
SUT VIC AB 1 CT1 27 (SUTURE) ×2
SUT VIC AB 1 CT1 27XBRD ANBCTR (SUTURE) ×2 IMPLANT
SUT VIC AB 2-0 FS1 27 (SUTURE) ×2 IMPLANT
SUTURE FIBERWR #2 38 T-5 BLUE (SUTURE) ×4 IMPLANT
TOWEL OR 17X24 6PK STRL BLUE (TOWEL DISPOSABLE) ×2 IMPLANT
TOWEL OR 17X26 10 PK STRL BLUE (TOWEL DISPOSABLE) ×4 IMPLANT
TUBE CONNECTING 12X1/4 (SUCTIONS) ×2 IMPLANT
TUBING CYSTO DISP (UROLOGICAL SUPPLIES) ×2 IMPLANT
UNDERPAD 30X30 INCONTINENT (UNDERPADS AND DIAPERS) ×2 IMPLANT
WATER STERILE IRR 1000ML POUR (IV SOLUTION) ×2 IMPLANT

## 2015-12-30 NOTE — Progress Notes (Signed)
Pt was found down on the floor fully dressed stating to call 911 she has fallen vital signs take and charted in Epic. Pt is very confused and insisting on going to surgery. Bandage is bloody and rt knee is swollen. PA on call was called twice. Vital signs and medication that pt had taken was given CBG also done to R/O hypoglycemia. Which was R/O and charted in Epic. I informed the PA that pt was insisting on going to surgery and attempting to leave I requested that if he saw her it would put her mind at ease. PA ordered to give pt the Ativan and remove to present dressing and call in no improvement. Pt also place on bed alarm. No new orders will continue to monitor.

## 2015-12-30 NOTE — OR Nursing (Signed)
[facility] arrived to the pacu from the OR.  Dr. Thurston HoleWainer wrapped an ace bandage over a knee immobilizer.  The ace is wrapped also with pink tape to prevent the immobilizer from coming off.  We are to maintain/reinforce the ace as necessary to prevent the knee immobilizer from coming off.

## 2015-12-30 NOTE — Progress Notes (Signed)
OT Cancellation Note  Patient Details Name: Brooke SalvoSandra H [place] MRN: [id number] DOB: 18-Dec-1945   Cancelled Treatment:    Reason Eval/Treat Not Completed: Other (comment) -- Pt fell around 2:00 am per nursing and RN reports patella tendon rupture and need to return to surgery. Currently patient on bed rest. OT will sign off for now and await re-orders.   Aniza Shor A 12/30/2015, 3:26 PM

## 2015-12-30 NOTE — Op Note (Signed)
NAMClarisa Davidson:  Davidson, Brooke             ACCOUNT NO.:  [id number]  MEDICAL RECORD NO.:  [id number]  LOCATION:  5N22C                        FACILITY:  [facility]  PHYSICIAN:  Molly Maduroobert A. Thurston HoleWainer, M.D. DATE OF BIRTH:  Apr 11, 1946  DATE OF PROCEDURE:  12/30/2015 DATE OF DISCHARGE:                              OPERATIVE REPORT   PREOPERATIVE DIAGNOSIS:  Right knee acute traumatic patellar tendon rupture with wound dehiscence.  POSTOPERATIVE DIAGNOSIS:  Right knee acute traumatic patellar tendon rupture with wound dehiscence status post total knee replacement.  PROCEDURE: 1. Right knee EUA, followed by patellar tendon rupture repair. 2. Right knee wound irrigation and debridement with wound dehiscence     repair.  SURGEON:  Elana Almobert A. Thurston HoleWainer, M.D.  ASSISTANT:  Ollen GrossHenry C Martensen, PA-C.  ANESTHESIA:  General.  OPERATIVE TIME:  1 hour and 20 minutes.  COMPLICATIONS:  None.  INDICATION FOR PROCEDURE:  Brooke Davidson is a 70 year old woman, who underwent a right total knee replacement 2 days ago.  She fell last night in her hospital room sustaining a hyperflexion injury to the knee with the wound dehiscence and patellar tendon rupture noted on exam and x-ray, and she is now to undergo irrigation debridement, wound dehiscence repair, and patellar tendon repair.  DESCRIPTION:  Brooke Davidson was brought to the operating room on December 30, 2015, placed on the operative table in supine position.  After being placed under general anesthesia, her right knee was examined.  She had 2 areas of wound dehiscence from her surgical wound from 2 days ago with obvious patella instability from patellar tendon rupture.  The right knee was otherwise examined with full range of motion.  Knee was otherwise stable.  The right leg was prepped with sterile Betadine and draped using sterile technique.  Time-out procedure was called and the correct right knee identified.  The right leg was exsanguinated and tourniquet  elevated to 350 mm.  The surgical wound was opened completely, and the old sutures were removed.  The patellar tendon was found to be completely ruptured distally, still well attached to the patella.  There was hematoma noted from the previous surgery 2 days ago, and this hematoma was evacuated.  The other total knee components were in excellent position and excellent fixation noted.  No loosening noted. At this point, 6 L of saline solution was thoroughly irrigated through the knee.  After this was done, the patellar tendon rupture was repaired with a running #2 FiberWire mattress suture from proximal to distal and then 2 drill holes were placed at the tibial tubercle, and the sutures were passed through this drill hole and then tied down over bone with the appropriate tension to the patellar tendon with excellent fixation. The patella retinaculum laterally had also ruptured, and this was repaired with both #2 FiberWire and 0 Vicryl.  After this was done, then the median arthrotomy of which the sutures had been taken out was then re-repaired with a running #1 Vicryl suture.  A complete and tight repair was achieved and there was excellent patella stability and excellent restoration of the patellar tendon tensioning, and the knee could be brought from 0-40 degrees without excessive tension on the repair.  At  this point, then the remainder of the wound was closed with interrupted 2-0 nylon skin-to-skin mattress sutures.  Excellent approximation was achieved.  At this point, then sterile dressings were applied and a knee immobilizer, and then tourniquet was released, and the patient awakened and taken to recovery room in stable condition. Needle and sponge counts correct x2 at the end the case.     Shloima Clinch A. Thurston Hole, M.D.     RAW/MEDQ  D:  12/30/2015  T:  12/30/2015  Job:  [id number]

## 2015-12-30 NOTE — Transfer of Care (Signed)
Immediate Anesthesia Transfer of Care Note  Patient: Brooke Davidson  Procedure(s) Performed: Procedure(s): PATELLA TENDON REPAIR (Right) IRRIGATION AND DEBRIDEMENT KNEE (Right)  Patient Location: PACU  Anesthesia Type:General  Level of Consciousness: sedated  Airway & Oxygen Therapy: Patient Spontanous Breathing and Patient connected to nasal cannula oxygen  Post-op Assessment: Report given to RN and Post -op Vital signs reviewed and stable  Post vital signs: Reviewed and stable  Last Vitals:  Filed Vitals:   12/30/15 1300 12/30/15 1819  BP: 113/75 128/76  Pulse: 65 59  Temp: 36.5 C 36.2 C  Resp: 18 15    Last Pain:  Filed Vitals:   12/30/15 1822  PainSc: 8       Patients Stated Pain Goal: 3 (12/28/15 2000)  Complications: No apparent anesthesia complications

## 2015-12-30 NOTE — Progress Notes (Signed)
Pt leaned on side of bed setting off the bed alarm. I asked pt what is she attempting to get and it was a plastic bag I asked the patient not to lean over the side of bed I gave the bag to patient and she then placed it back on the side of the bed and leaned over to retreive it setting off the bed alarm again. I got the bag for her again and placed it in the bed with her and told her not to to place bag on floor and do not lean over the side of the bed. Bed alarm placed. Ilean SkillVeronica Isabell Bonafede LPN

## 2015-12-30 NOTE — Progress Notes (Signed)
Orthopedic Tech Progress Note Patient Details:  Glean SalvoSandra H Mcdaniel 29-Jun-1945 [id number]  Patient ID: Glean SalvoSandra H Grall, female   DOB: 29-Jun-1945, 70 y.o.   MRN: [id number] Placed pt's rle on cpm @0 -90 degrees @1400   [name], Camden Knotek 12/30/2015, 2:05 PM

## 2015-12-30 NOTE — Anesthesia Procedure Notes (Signed)
Procedure Name: Intubation Date/Time: 12/30/2015 4:32 PM Performed by: Orvilla FusATO, Linsey Arteaga A Pre-anesthesia Checklist: Patient identified, Emergency Drugs available, Suction available, Patient being monitored and Timeout performed Patient Re-evaluated:Patient Re-evaluated prior to inductionOxygen Delivery Method: Circle system utilized Preoxygenation: Pre-oxygenation with 100% oxygen Intubation Type: IV induction Ventilation: Mask ventilation without difficulty Laryngoscope Size: Mac and 3 Grade View: Grade I Tube type: Oral Tube size: 7.0 mm Number of attempts: 1 Airway Equipment and Method: Stylet Placement Confirmation: ETT inserted through vocal cords under direct vision,  breath sounds checked- equal and bilateral and positive ETCO2 Secured at: 21 cm Tube secured with: Tape Dental Injury: Teeth and Oropharynx as per pre-operative assessment

## 2015-12-30 NOTE — Clinical Social Work Note (Signed)
CSW received call from [facility]. Patient had been in communication admissions staff at [facility] regarding rehab and they have initiated insurance authorization and will have SNF liaison visit with patient and complete admissions paperwork. CSW will continue to follow and facilitate d/c to SNF when medically stable.  Genelle BalVanessa Brennyn Ortlieb, MSW, LCSW Licensed Clinical Social Worker Clinical Social Work Department [facility] [phone]

## 2015-12-30 NOTE — Anesthesia Preprocedure Evaluation (Addendum)
Anesthesia Evaluation  Patient identified by MRN, date of birth, ID band Patient awake    Reviewed: Allergy & Precautions, NPO status , Patient's Chart, lab work & pertinent test results  Airway Mallampati: I  TM Distance: >3 FB Neck ROM: Full    Dental  (+) Partial Lower, Partial Upper   Pulmonary asthma , former smoker,    breath sounds clear to auscultation       Cardiovascular hypertension, Pt. on medications  Rhythm:Regular Rate:Normal     Neuro/Psych PSYCHIATRIC DISORDERS Anxiety Depression Schizophrenia    GI/Hepatic Neg liver ROS, GERD  ,  Endo/Other  diabetes, Type 2, Oral Hypoglycemic Agents  Renal/GU negative Renal ROS  negative genitourinary   Musculoskeletal  (+) Arthritis , Osteoarthritis,    Abdominal   Peds negative pediatric ROS (+)  Hematology   Anesthesia Other Findings - HLD  Reproductive/Obstetrics negative OB ROS                            Anesthesia Physical Anesthesia Plan  ASA: II  Anesthesia Plan: General   Post-op Pain Management:    Induction: Intravenous, Rapid sequence and Cricoid pressure planned  Airway Management Planned: Oral ETT  Additional Equipment:   Intra-op Plan:   Post-operative Plan: Extubation in OR  Informed Consent: I have reviewed the patients History and Physical, chart, labs and discussed the procedure including the risks, benefits and alternatives for the proposed anesthesia with the patient or authorized representative who has indicated his/her understanding and acceptance.   Dental advisory given  Plan Discussed with: CRNA  Anesthesia Plan Comments: (Pt refused femoral nerve block, states it made her feel weird. )       Anesthesia Quick Evaluation

## 2015-12-30 NOTE — Progress Notes (Signed)
Pt arrived back to the unit from pacu; pt drowsy and sleeping; responds to voice but goes back to sleep; pt VSS; IV intact and transfusing; RLE incision compression wrap dsg clean, dry and intact with no stain or active bleeding noted. Pt able to wrinkle toes when asked; pt remains on 2L oxygen; knee immobilizer applied my MD remains intact along with the compression dsg; bone foam applied to RLE; safety sitter remains at pt bedside. Will closely monitor pt. Dionne BucyP. Amo Chena Chohan RN

## 2015-12-30 NOTE — Progress Notes (Signed)
PT Cancellation Note  Patient Details Name: Brooke SalvoSandra H Amaro MRN: [id number] DOB: [birth year]   Cancelled Treatment:    Reason Eval/Treat Not Completed: Medical issues which prohibited therapy Pt fell around 2:00 am per nursing and RN reports patella tendon rupture and need to return to surgery.  Currently patient on bed rest.  Will inform supervising PT to sign off on patient at this time.     Halah Whiteside Artis DelayJ Bettye Sitton 12/30/2015, 2:05 PM Joycelyn RuaAimee Vaun Hyndman, PTA pager [phone]

## 2015-12-30 NOTE — Interval H&P Note (Signed)
History and Physical Interval Note:  12/30/2015 4:06 PM  [name]  has presented today for surgery, with the diagnosis of Right Patella tendon Rupture  The various methods of treatment have been discussed with the patient and family. After consideration of risks, benefits and other options for treatment, the patient has consented to  Procedure(s): PATELLA TENDON REPAIR (Right) as a surgical intervention .  The patient's history has been reviewed, patient examined, no change in status, stable for surgery.  I have reviewed the patient's chart and labs.  Questions were answered to the patient's satisfaction.     Salvatore MarvelWAINER,Robinn Overholt A

## 2015-12-31 ENCOUNTER — Encounter (HOSPITAL_COMMUNITY): Payer: Self-pay | Admitting: Orthopedic Surgery

## 2015-12-31 DIAGNOSIS — S86811A Strain of other muscle(s) and tendon(s) at lower leg level, right leg, initial encounter: Secondary | ICD-10-CM | POA: Diagnosis not present

## 2015-12-31 HISTORY — DX: Strain of other muscle(s) and tendon(s) at lower leg level, right leg, initial encounter: S86.811A

## 2015-12-31 LAB — CBC
HEMATOCRIT: 27.3 % — AB (ref 36.0–46.0)
HEMOGLOBIN: 8.7 g/dL — AB (ref 12.0–15.0)
MCH: 27.2 pg (ref 26.0–34.0)
MCHC: 31.9 g/dL (ref 30.0–36.0)
MCV: 85.3 fL (ref 78.0–100.0)
Platelets: 276 10*3/uL (ref 150–400)
RBC: 3.2 MIL/uL — AB (ref 3.87–5.11)
RDW: 15.1 % (ref 11.5–15.5)
WBC: 11.9 10*3/uL — AB (ref 4.0–10.5)

## 2015-12-31 LAB — BASIC METABOLIC PANEL
ANION GAP: 5 (ref 5–15)
BUN: 19 mg/dL (ref 6–20)
CALCIUM: 8.4 mg/dL — AB (ref 8.9–10.3)
CHLORIDE: 107 mmol/L (ref 101–111)
CO2: 26 mmol/L (ref 22–32)
Creatinine, Ser: 0.94 mg/dL (ref 0.44–1.00)
GFR calc non Af Amer: >60 mL/min (ref >60)
GLUCOSE: 76 mg/dL (ref 65–99)
POTASSIUM: 4.3 mmol/L (ref 3.5–5.1)
Sodium: 138 mmol/L (ref 135–145)

## 2015-12-31 LAB — GLUCOSE, CAPILLARY
GLUCOSE-CAPILLARY: 73 mg/dL (ref 65–99)
GLUCOSE-CAPILLARY: 106 mg/dL — AB (ref 65–99)
Glucose-Capillary: 106 mg/dL — ABNORMAL HIGH (ref 65–99)
Glucose-Capillary: 157 mg/dL — ABNORMAL HIGH (ref 65–99)

## 2015-12-31 MED ORDER — LORAZEPAM 1 MG PO TABS
1.0000 mg | ORAL_TABLET | Freq: Four times a day (QID) | ORAL | Status: DC
  Administered 2015-12-31 – 2016-01-01 (×2): 1 mg via ORAL
  Filled 2015-12-31 (×2): qty 1

## 2015-12-31 MED ORDER — LORAZEPAM 2 MG/ML IJ SOLN
0.5000 mg | Freq: Once | INTRAMUSCULAR | Status: AC
  Administered 2015-12-31: 0.5 mg via INTRAVENOUS
  Filled 2015-12-31: qty 1

## 2015-12-31 MED ORDER — INSULIN GLARGINE 100 UNIT/ML ~~LOC~~ SOLN
20.0000 [IU] | Freq: Every day | SUBCUTANEOUS | Status: DC
  Administered 2015-12-31: 20 [IU] via SUBCUTANEOUS
  Filled 2015-12-31 (×2): qty 0.2

## 2015-12-31 MED ORDER — LORAZEPAM 2 MG/ML IJ SOLN: 0.5000 mg | Freq: Four times a day (QID) | INTRAMUSCULAR | Status: DC

## 2015-12-31 MED ORDER — CEFAZOLIN SODIUM-DEXTROSE 2-4 GM/100ML-% IV SOLN
2.0000 g | Freq: Three times a day (TID) | INTRAVENOUS | Status: DC
  Administered 2015-12-31 – 2016-01-01 (×3): 2 g via INTRAVENOUS
  Filled 2015-12-31 (×5): qty 100

## 2015-12-31 NOTE — Progress Notes (Signed)
Spoke with the PA on the telephone about needing another PT order. PA will provide a new order for ambulation with PT and no ROM.

## 2015-12-31 NOTE — Anesthesia Postprocedure Evaluation (Signed)
Anesthesia Post Note  Patient: Brooke Davidson  Procedure(s) Performed: Procedure(s) (LRB): PATELLA TENDON REPAIR (Right) IRRIGATION AND DEBRIDEMENT KNEE (Right)  Patient location during evaluation: PACU Anesthesia Type: General Level of consciousness: awake and alert Pain management: pain level controlled Vital Signs Assessment: post-procedure vital signs reviewed and stable Respiratory status: spontaneous breathing, nonlabored ventilation, respiratory function stable and patient connected to nasal cannula oxygen Cardiovascular status: blood pressure returned to baseline and stable Postop Assessment: no signs of nausea or vomiting Anesthetic complications: no    Last Vitals:  Filed Vitals:   12/30/15 2014 12/31/15 0628  BP: 174/77 119/54  Pulse: 59 52  Temp: 36.3 C 36.7 C  Resp: 16 15    Last Pain:  Filed Vitals:   12/31/15 0629  PainSc: Asleep                 Shelton SilvasKevin D Oak Dorey

## 2015-12-31 NOTE — Progress Notes (Signed)
Subjective: 1 Day Post-Op Procedure(s) (LRB): PATELLA TENDON REPAIR (Right) IRRIGATION AND DEBRIDEMENT KNEE (Right)  3 Days s/p right total knee replacement Patient reports pain as 3 on 0-10 scale.    Objective: Vital signs in last 24 hours: Temp:  [97.2 F (36.2 C)-98.1 F (36.7 C)] 98.1 F (36.7 C) (07/20 0628) Pulse Rate:  [52-73] 52 (07/20 0628) Resp:  [10-20] 15 (07/20 0628) BP: (113-174)/(54-94) 119/54 mmHg (07/20 0628) SpO2:  [97 %-100 %] 100 % (07/20 0628)  Intake/Output from previous day: 07/19 0701 - 07/20 0700 In: 620 [P.O.:620] Out: 10 [Blood:10] Intake/Output this shift:     Recent Labs  12/29/15 0517 12/30/15 0503 12/31/15 0411  HGB 9.3* 8.9* 8.7*    Recent Labs  12/30/15 0503 12/31/15 0411  WBC 13.7* 11.9*  RBC 3.21* 3.20*  HCT 26.3* 27.3*  PLT 240 276    Recent Labs  12/30/15 0503 12/31/15 0411  NA 136 138  K 4.8 4.3  CL 106 107  CO2 22 26  BUN 20 19  CREATININE 1.08* 0.94  GLUCOSE 132* 76  CALCIUM 8.9 8.4*   No results for input(s): LABPT, INR in the last 72 hours.  ABD soft Neurovascular intact Sensation intact distally Intact pulses distally Dorsiflexion/Plantar flexion intact Incision: dressing C/D/I  Assessment/Plan: 1 Day Post-Op Procedure(s) (LRB): PATELLA TENDON REPAIR (Right) IRRIGATION AND DEBRIDEMENT KNEE (Right)  3 Day Post Op Right total knee replacement Principal Problem:   Rupture of right patellar tendon Active Problems:   Schizoaffective disorder (HCC)   Diabetes mellitus without complication (HCC)   Hypertension   HLD (hyperlipidemia)   Asthma   Pulmonary nodule   Glaucoma   Insomnia   Contact dermatitis due to chemicals   Primary localized osteoarthritis of right knee  Advance diet Up with therapy Discharge to SNF tomorrow.    No range of motion with her knee.  She is in a long leg splint because of her patellar tendon repair.  She is Weight bearing as tolerated with the assistance of a walker    Wear knee immobilizer over dressing when ambulating  Brooke Davidson J 12/31/2015, 8:05 AM

## 2015-12-31 NOTE — Progress Notes (Signed)
Orthopedic Tech Progress Note Patient Details:  Glean SalvoSandra H Royle 08-30-1945 [id number]  Patient ID: Glean SalvoSandra H Logan, female   DOB: 08-30-1945, 70 y.o.   MRN: [id number] rn requested that pt be left in knee immobilizer.   Trinna PostMartinez, Anginette Espejo J 12/31/2015, 6:34 AM

## 2016-01-01 ENCOUNTER — Encounter (HOSPITAL_COMMUNITY): Payer: Self-pay | Admitting: Physician Assistant

## 2016-01-01 LAB — GLUCOSE, CAPILLARY: Glucose-Capillary: 83 mg/dL (ref 65–99)

## 2016-01-01 MED ORDER — LORAZEPAM 0.5 MG PO TABS: ORAL_TABLET | ORAL | Status: DC

## 2016-01-01 MED ORDER — LORAZEPAM 2 MG/ML IJ SOLN: 0.5000 mg | Freq: Four times a day (QID) | INTRAMUSCULAR | Status: DC

## 2016-01-01 MED ORDER — POLYETHYLENE GLYCOL 3350 17 G PO PACK
PACK | ORAL | Status: DC
Start: 2016-01-01 — End: 2019-06-06

## 2016-01-01 MED ORDER — OXYCODONE HCL 5 MG PO TABS: ORAL_TABLET | ORAL | Status: DC

## 2016-01-01 MED ORDER — LORAZEPAM 0.5 MG PO TABS
0.5000 mg | ORAL_TABLET | Freq: Four times a day (QID) | ORAL | Status: DC
  Administered 2016-01-01: 1 mg via ORAL
  Filled 2016-01-01: qty 2

## 2016-01-01 MED ORDER — APIXABAN 2.5 MG PO TABS: ORAL_TABLET | ORAL | Status: DC

## 2016-01-01 NOTE — Care Management Important Message (Signed)
Important Message  Patient Details  Name: Brooke SalvoSandra H Meraz MRN: [id number] Date of Birth: [birth year]   Medicare Important Message Given:  Yes    Bernadette HoitShoffner, Lamoyne Palencia Coleman 01/01/2016, 8:27 AM

## 2016-01-01 NOTE — Clinical Social Work Note (Signed)
Patient will discharge today per MD order. Patient will discharge to: [facility] RN to call report prior to transportation to: [phone] Transportation: [facility]  CSW sent discharge summary to SNF for review.    Vickii PennaGina Sidonia Nutter, LCSW [phone]  5N1-9, 2S 15-16 and Psychiatric Service Line  Licensed Clinical Social Worker

## 2016-01-01 NOTE — Discharge Summary (Signed)
Patient ID: Brooke Davidson MRN: [id number] DOB/AGE: [birth year] 70 y.o.  Admit date: 12/28/2015 Discharge date: 01/01/2016  Admission Diagnoses:  Principal Problem:   Rupture of right patellar tendon Active Problems:   Schizoaffective disorder (HCC)   Diabetes mellitus without complication (HCC)   Hypertension   HLD (hyperlipidemia)   Asthma   Pulmonary nodule   Glaucoma   Insomnia   Contact dermatitis due to chemicals   Primary localized osteoarthritis of right knee   s/p right total knee replacement on 12/28/2015   Fall during current hospitalization 12/30/2015   Discharge Diagnoses:  Same  Past Medical History  Diagnosis Date  . Insomnia     takes Ambien nightly   . Glaucoma     uses eye drops daily  . Pulmonary nodule   . HLD (hyperlipidemia)     takes Pravastatin daily  . Osteoarthritis     knees  . Primary localized osteoarthritis of left knee   . Diabetes mellitus without complication (HCC)     takes Metformin daily  . Muscle spasm     takes Baclofen daily as needed  . Complication of anesthesia     slow to wake up  . Cough     nonproductive and without fever  . History of bronchitis a yr ago  . Joint pain   . Joint swelling   . Chronic back pain   . Eczema   . Urinary frequency   . Urinary urgency   . Depression     but doesn't take any meds   . Hypertension     takes Losartan-HCTZ daily  . Asthma     uses Symbicort daily and ALbuterol as needed  . GERD (gastroesophageal reflux disease)     doesn't take any meds  . Constipation   . Contact dermatitis due to chemicals   . Primary localized osteoarthritis of right knee 12/17/2015  . Heart murmur     states it was told to her as a younger woman, no problems that she knows   . Anemia     in the past  . Rupture of right patellar tendon 12/31/2015    Patient fell the second evening after total knee replacement and suffer and open patella tendon rupture of her right knee.  Surgically repaired after  an extensive irrigation and debridement on 12/30/2015  . 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 Fall during current hospitalization 12/30/2015 12/30/2015    Surgeries: Procedure(s): PATELLA TENDON REPAIR IRRIGATION AND DEBRIDEMENT KNEE on 12/28/2015 - 12/30/2015   Consultants:    Discharged Condition: Improved  Hospital Course: Brooke Davidson is an 70 y.o. female who was admitted 12/28/2015 for operative treatment of right total knee replacement. Patient has severe unremitting pain that affects sleep, daily activities, and work/hobbies. After pre-op clearance the patient was taken to the operating room on 12/28/2015 - 12/30/2015 and underwent  Procedure(s): Right total knee replacement on 12/28/2015    Patient was given perioperative antibiotics: Anti-infectives    Start     Dose/Rate Route Frequency Ordered Stop   12/31/15 1400  ceFAZolin (ANCEF) IVPB 2g/100 mL premix     2 g 200 mL/hr over 30 Minutes Intravenous Every 8 hours 12/31/15 1051     12/30/15 1200  ceFAZolin (ANCEF) IVPB 2g/100 mL premix  Status:  Discontinued     2 g 200 mL/hr over 30 Minutes Intravenous Every 6 hours 12/30/15 1125 12/31/15 1051   12/28/15 1530  ceFAZolin (ANCEF) IVPB 2g/100  mL premix     2 g 200 mL/hr over 30 Minutes Intravenous Every 6 hours 12/28/15 1414 12/28/15 2113   12/28/15 0830  ceFAZolin (ANCEF) IVPB 2g/100 mL premix     2 g 200 mL/hr over 30 Minutes Intravenous To [facility] 12/25/15 1236 12/28/15 0926       Patient was given sequential compression devices, early ambulation, and chemoprophylaxis to prevent DVT.  Post op day 1 patient was alert and oriented but very impulsive and unsafe.  In the middle of the night on post op day 1 she was found on the floor fully dressed with her bag on her am and her knee was bleeding from an obvious fall.  Post op day 2 when examining the open wound it was obvious on exam that the patella tendon was ruptured.  Xrays supported this  finding and showed no other acute abnormality.  Post op day 2 (12/30/2015) patient was taken back to the operating room for PATELLA TENDON REPAIR IRRIGATION AND DEBRIDEMENT KNEE. She did well following this procedure.  Her ativan dose was increased due to her agitation to prevent unsafe choices and movement.  Over the last 24 hrs, she has not needed a sitter for her safety and she had no bed alarms last night.  Patient benefited maximally from hospital stay.  Recent vital signs: Patient Vitals for the past 24 hrs:  BP Temp Temp src Pulse Resp SpO2  01/01/16 0500 (!) 150/60 mmHg 98.4 F (36.9 C) Oral 77 18 99 %  12/31/15 1300 (!) 119/55 mmHg 98.6 F (37 C) Oral 70 16 98 %     Recent laboratory studies:  Recent Labs  12/30/15 0503 12/31/15 0411  WBC 13.7* 11.9*  HGB 8.9* 8.7*  HCT 26.3* 27.3*  PLT 240 276  NA 136 138  K 4.8 4.3  CL 106 107  CO2 22 26  BUN 20 19  CREATININE 1.08* 0.94  GLUCOSE 132* 76  CALCIUM 8.9 8.4*     Discharge Medications:     Medication List    STOP taking these medications        ciprofloxacin 500 MG tablet  Commonly known as:  CIPRO     clobetasol cream 0.05 %  Commonly known as:  TEMOVATE     desoximetasone 0.05 % cream  Commonly known as:  TOPICORT     doxycycline 100 MG capsule  Commonly known as:  VIBRAMYCIN     hydrocortisone 2.5 % rectal cream  Commonly known as:  ANUSOL-HC     metroNIDAZOLE 500 MG tablet  Commonly known as:  FLAGYL     triamcinolone cream 0.1 %  Commonly known as:  KENALOG     zolpidem 10 MG tablet  Commonly known as:  AMBIEN      TAKE these medications        acetaminophen 500 MG tablet  Commonly known as:  TYLENOL  Take 500 mg by mouth at bedtime.     albuterol 108 (90 Base) MCG/ACT inhaler  Commonly known as:  PROVENTIL HFA;VENTOLIN HFA  Inhale into the lungs every 6 (six) hours as needed for wheezing or shortness of breath.     apixaban 2.5 MG Tabs tablet  Commonly known as:  ELIQUIS  1 tab  po BID to prevent blood clots     budesonide-formoterol 80-4.5 MCG/ACT inhaler  Commonly known as:  SYMBICORT  Inhale 2 puffs into the lungs 2 (two) times daily.     cetirizine 10 MG tablet  Commonly known as:  ZYRTEC  Take 10 mg by mouth daily.     docusate sodium 100 MG capsule  Commonly known as:  COLACE  Take 1 capsule (100 mg total) by mouth every 12 (twelve) hours.     latanoprost 0.005 % ophthalmic solution  Commonly known as:  XALATAN  Place 1 drop into both eyes at bedtime.     linaclotide 145 MCG Caps capsule  Commonly known as:  LINZESS  Take 1 capsule (145 mcg total) by mouth daily before breakfast.     LORazepam 0.5 MG tablet  Commonly known as:  ATIVAN  1 or 2 tablets every six hours for anxiety and schizoaffective disorder     losartan-hydrochlorothiazide 100-12.5 MG tablet  Commonly known as:  HYZAAR  Take 0.5 tablets by mouth daily.     metFORMIN 500 MG tablet  Commonly known as:  GLUCOPHAGE  Take 500 mg by mouth 2 (two) times daily with a meal.     oxyCODONE 5 MG immediate release tablet  Commonly known as:  Oxy IR/ROXICODONE  1-2 tablets every 4-6 hrs as needed for pain     polyethylene glycol packet  Commonly known as:  MIRALAX / GLYCOLAX  17grams in 6 oz of water twice a day until bowel movement.  LAXITIVE.  Restart if two days since last bowel movement     pravastatin 80 MG tablet  Commonly known as:  PRAVACHOL  Take 80 mg by mouth at bedtime.        Diagnostic Studies: Ct Abdomen Pelvis Wo Contrast  12/07/2015  CLINICAL DATA:  Abdominal pain.  Constipation. EXAM: CT ABDOMEN AND PELVIS WITHOUT CONTRAST TECHNIQUE: Multidetector CT imaging of the abdomen and pelvis was performed following the standard protocol without IV contrast. COMPARISON:  None. FINDINGS: There is prior cholecystectomy. There are unremarkable unenhanced appearances of the liver and bile ducts. The pancreas, spleen, adrenals and kidneys are unremarkable. Ureters and urinary  bladder appear normal. There is a hiatal hernia. Stomach and small bowel appear unremarkable. No evidence of appendicitis. There is colonic diverticulosis. Mild stranding opacity around the mid descending colon may represent minimal changes of diverticulitis. No bowel obstruction. No extraluminal air. There is a 3 cm calcified uterine fibroid, and several additional fibroids are suspected. Adnexal regions are unremarkable. The abdominal aorta is normal in caliber with mild atherosclerotic calcification. There is no significant abnormality in the lower chest. There is no significant musculoskeletal lesion. There is grade 1 spondylolisthesis at L4-5. IMPRESSION: 1. Diverticulosis. Mild stranding opacity around the mid descending colon may represent minimal changes of diverticulitis. No abscess. No extraluminal air. No bowel obstruction. 2. Uterine fibroids. 3. Hiatal hernia. Electronically Signed   By: Ellery Plunk M.D.   On: 12/07/2015 03:35   Dg Knee Right Port  12/30/2015  CLINICAL DATA:  70 year old female with history of trauma from a fall yesterday evening with injury to the right knee. History of right knee total arthroplasty two days ago. EXAM: PORTABLE RIGHT KNEE - 1-2 VIEW COMPARISON:  No priors. FINDINGS: Postoperative changes of total knee arthroplasty are noted. The femoral and tibial components of the prosthesis appear properly seated without periprosthetic fracture. There is high density fluid in the joint space. Patella alta. Patellar tendon is not well visualized. IMPRESSION: 1. Findings are concerning for probable ruptured patellar tendon with hemarthrosis. Clinical correlation is recommended. Electronically Signed   By: Trudie Reed M.D.   On: 12/30/2015 12:43   Dg Abd Acute W/chest  12/13/2015  CLINICAL DATA:  Constipation 1 week. EXAM: DG ABDOMEN ACUTE W/ 1V CHEST COMPARISON:  CT 12/07/2015 FINDINGS: Lungs are adequately inflated without consolidation or effusion. Calcified granuloma  over the right base. Cardiomediastinal silhouette is within normal. There is calcified plaque over the aortic arch. Degenerative changes of the spine. Bowel gas pattern is nonobstructive. There is spur few scattered air-fluid levels over the colon. No free peritoneal air. Surgical clips over the right upper quadrant. Calcified uterine fibroid over the left pelvis. There are degenerative changes of the spine, sacroiliac joints, symphysis pubis joint and hips. IMPRESSION: Nonobstructive bowel gas pattern. No acute cardiopulmonary disease. Electronically Signed   By: Elberta Fortis M.D.   On: 12/13/2015 17:40    Disposition: 01-Home or Self Care      Discharge Instructions    Diet - low sodium heart healthy    Complete by:  As directed      Discharge instructions    Complete by:  As directed   DO NOT REMOVE ANY PART OF THE DRESSING ON RIGHT LEG.  PATIENT HAS HAD AN OPEN PATELLA TENDON REPAIR DUE TO AN ACUTE OPEN PATELLA TENDON RUPTURE 2 DAYS AFTER HER RIGHT TOTAL KNEE REPLACEMENT.  NO RANGE OF MOTION OF HER RIGHT KNEE  She is weight bearing as tolerated with the assistance of a walker and a person.  She usually needs help pulling out her right foot as she sits so she does not bend her right knee when she sits down.  She will need PT daily for gait training and bed mobility.   She will need OT to learn how to do ADLs without getting dressing wet or bending her knee.  She has schizoaffective order that we have been managing in the hospital with ativan 0.5 mg 1-2 tablets every 6 hrs.  Her CBGs have been well controlled in the hospital.     Increase activity slowly    Complete by:  As directed            Follow-up Information    Follow up with Nilda Simmer, MD On 01/07/2016.   Specialty:  Orthopedic Surgery   Why:  appt time 11 am   Contact information:   [address] [place] [address] [phone]        Signed: Pascal Lux 01/01/2016, 8:28  AM

## 2016-01-01 NOTE — Evaluation (Signed)
Physical Therapy Evaluation Patient Details Name: Brooke Davidson MRN: [id number] DOB: May 31, 1946 Today's Date: 01/01/2016   History of Present Illness  Patient is a 70 y/o female admitted s/p R TKA.  PMH positive for L TKA 7/16, HTN, DM, HLD, Asthma, Glaucoma and Schizoaffective disorder. On 7/19 pt fell, rupturing patellar tendon requiring surgical repair. Pt now WBAT in knee immobilizer.  Clinical Impression  Pt admitted with above diagnosis. Pt currently with functional limitations due to the deficits listed below (see PT Problem List). At the time of PT eval pt was able to perform transfers and ambulation with gross min assist for balance support and safety. Second person helpful for equipment and chair follow however pt was grossly safe with +1 assist. Pt will benefit from skilled PT to increase their independence and safety with mobility to allow discharge to the venue listed below.       Follow Up Recommendations SNF;Supervision/Assistance - 24 hour    Equipment Recommendations  None recommended by PT    Recommendations for Other Services       Precautions / Restrictions Precautions Precautions: Fall;Knee Required Braces or Orthoses: Knee Immobilizer - Right Knee Immobilizer - Right: On at all times Restrictions Weight Bearing Restrictions: No RLE Weight Bearing: Weight bearing as tolerated      Mobility  Bed Mobility               General bed mobility comments: Pt in recliner chair upon PT arrival.   Transfers Overall transfer level: Needs assistance Equipment used: Rolling walker (2 wheeled) Transfers: Sit to/from Stand Sit to Stand: Supervision         General transfer comment: VC's for hand placement on seated surface for safety. Pt was able to power-up to full standing position with +2 min assist for balance support and safety.   Ambulation/Gait Ambulation/Gait assistance: Min assist Ambulation Distance (Feet): 50 Feet Assistive device: Rolling  walker (2 wheeled) Gait Pattern/deviations: Step-to pattern;Decreased stride length;Trunk flexed Gait velocity: Decreased Gait velocity interpretation: Below normal speed for age/gender General Gait Details: VC's for improved posture, sequencing and general safety with the RW.   Stairs            Wheelchair Mobility    Modified Rankin (Stroke Patients Only)       Balance Overall balance assessment: Needs assistance Sitting-balance support: Feet supported;No upper extremity supported Sitting balance-Leahy Scale: Fair     Standing balance support: No upper extremity supported;During functional activity Standing balance-Leahy Scale: Poor Standing balance comment: Requires UE support to maintain standing balance.                              Pertinent Vitals/Pain Pain Assessment: Faces Faces Pain Scale: Hurts little more Pain Location: R knee Pain Descriptors / Indicators: Operative site guarding;Sore Pain Intervention(s): Limited activity within patient's tolerance;Monitored during session;Repositioned    Home Living Family/patient expects to be discharged to:: Skilled nursing facility Living Arrangements: Alone   Type of Home: House Home Access: Level entry     Home Layout: One level Home Equipment: Cane - single point;Walker - 4 wheels      Prior Function Level of Independence: Independent with assistive device(s)         Comments: reports used rollator or cane at home, states had difficutly with curbs and getting into her SUV     Hand Dominance   Dominant Hand: Right    Extremity/Trunk Assessment  Upper Extremity Assessment: Defer to OT evaluation           Lower Extremity Assessment: RLE deficits/detail RLE Deficits / Details: Decreased strength and AROM consistent with above mentioned procedures.        Communication   Communication: No difficulties  Cognition Arousal/Alertness: Awake/alert Behavior During Therapy: WFL for  tasks assessed/performed Overall Cognitive Status: No family/caregiver present to determine baseline cognitive functioning                      General Comments      Exercises        Assessment/Plan    PT Assessment Patient needs continued PT services  PT Diagnosis Acute pain;Difficulty walking   PT Problem List Decreased strength;Decreased balance;Decreased mobility;Decreased range of motion;Decreased activity tolerance;Pain;Decreased knowledge of precautions  PT Treatment Interventions DME instruction;Gait training;Balance training;Functional mobility training;Patient/family education;Therapeutic activities;Therapeutic exercise   PT Goals (Current goals can be found in the Care Plan section) Acute Rehab PT Goals Patient Stated Goal: To [facility] then to home PT Goal Formulation: With patient Time For Goal Achievement: 01/08/16 Potential to Achieve Goals: Good    Frequency 7X/week   Barriers to discharge Decreased caregiver support      Co-evaluation               End of Session Equipment Utilized During Treatment: Gait belt Activity Tolerance: Patient tolerated treatment well Patient left: with call bell/phone within reach;in chair;with chair alarm set Nurse Communication: Mobility status         Time: [id number] PT Time Calculation (min) (ACUTE ONLY): 23 min   Charges:   PT Evaluation $PT Eval Moderate Complexity: 1 Procedure PT Treatments $Gait Training: 8-22 mins   PT G Codes:        Brooke SlipperKirkman, Brooke Davidson 01/01/2016, 1:59 PM   Brooke SlipperLaura Huckleberry Davidson, PT, DPT Acute Rehabilitation Services Pager: [phone]

## 2016-01-01 NOTE — Clinical Social Work Placement (Signed)
   CLINICAL SOCIAL WORK PLACEMENT  NOTE  Date:  01/01/2016  Patient Details  Name: Brooke Davidson MRN: [id number] Date of Birth: [birth year]  Clinical Social Work is seeking post-discharge placement for this patient at the Skilled  Nursing Facility level of care (*CSW will initial, date and re-position this form in  chart as items are completed):  Yes   Patient/family provided with [facility] Clinical Social Work Department's list of facilities offering this level of care within the geographic area requested by the patient (or if unable, by the patient's family).  Yes   Patient/family informed of their freedom to choose among providers that offer the needed level of care, that participate in Medicare, Medicaid or managed care program needed by the patient, have an available bed and are willing to accept the patient.  No   Patient/family informed of [facility]'s ownership interest in [facility] and [facility], as well as of the fact that they are under no obligation to receive care at these facilities.  PASRR submitted to EDS on       PASRR number received on       Existing PASRR number confirmed on 12/29/15     FL2 transmitted to all facilities in geographic area requested by pt/family on 12/29/15     FL2 transmitted to all facilities within larger geographic area on       Patient informed that his/her managed care company has contracts with or will negotiate with certain facilities, including the following:        Yes   Patient/family informed of bed offers received.  Patient [facility] bed [facility]     Physician recommends and patient chooses bed at      Patient to be [facility] on 01/01/16.  Patient to be transferred to facility by PTAR     Patient family notified on 01/01/16 of transfer.  Name of family member notified:  Patient is agreeable to relay dc plans to family as necessary     PHYSICIAN Please sign FL2     Additional  Comment:    _______________________________________________ Rondel BatonIngle, Marena Witts C, LCSW 01/01/2016, 11:17 AM

## 2016-01-01 NOTE — Progress Notes (Signed)
Subjective: 2 Days Post-Op Procedure(s) (LRB): PATELLA TENDON REPAIR (Right) IRRIGATION AND DEBRIDEMENT KNEE (Right) Patient reports pain as 4 on 0-10 scale.    Objective: Vital signs in last 24 hours: Temp:  [98.4 F (36.9 C)-98.6 F (37 C)] 98.4 F (36.9 C) (07/21 0500) Pulse Rate:  [70-77] 77 (07/21 0500) Resp:  [16-18] 18 (07/21 0500) BP: (119-150)/(55-60) 150/60 mmHg (07/21 0500) SpO2:  [98 %-99 %] 99 % (07/21 0500)  Intake/Output from previous day: 07/20 0701 - 07/21 0700 In: 240 [P.O.:240] Out: -  Intake/Output this shift:     Recent Labs  12/30/15 0503 12/31/15 0411  HGB 8.9* 8.7*    Recent Labs  12/30/15 0503 12/31/15 0411  WBC 13.7* 11.9*  RBC 3.21* 3.20*  HCT 26.3* 27.3*  PLT 240 276    Recent Labs  12/30/15 0503 12/31/15 0411  NA 136 138  K 4.8 4.3  CL 106 107  CO2 22 26  BUN 20 19  CREATININE 1.08* 0.94  GLUCOSE 132* 76  CALCIUM 8.9 8.4*   No results for input(s): LABPT, INR in the last 72 hours.  ABD soft Neurovascular intact Sensation intact distally Intact pulses distally Incision: dressing C/D/I  Assessment/Plan: 2 Days Post-Op Procedure(s) (LRB): PATELLA TENDON REPAIR (Right) IRRIGATION AND DEBRIDEMENT KNEE (Right)  Principal Problem:   Rupture of right patellar tendon Active Problems:   Schizoaffective disorder (HCC)   Diabetes mellitus without complication (HCC)   Hypertension   HLD (hyperlipidemia)   Asthma   Pulmonary nodule   Glaucoma   Insomnia   Contact dermatitis due to chemicals   Primary localized osteoarthritis of right knee   s/p right total knee replacement on 12/28/2015   Fall during current hospitalization 12/30/2015  Up with therapy D/C IV fluids Discharge to SNF  [facility]PP[facility],Dealva Lafoy J 01/01/2016, 8:06 AM

## 2016-01-01 NOTE — Progress Notes (Signed)
Brooke SalvoSandra H Davidson to be D/C'd Skilled nursing facility per MD order.  Discussed with the patient and all questions fully answered.  VSS. Dressing clean, dry and intact. IV catheter discontinued intact. Site without signs and symptoms of complications. Dressing and pressure applied.   Patient to escorted via [facility]  11:19 AM Report given to  [facility]  L'ESPERANCE, [name] 01/01/2016 11:19 AM

## 2016-01-04 ENCOUNTER — Other Ambulatory Visit: Payer: Self-pay

## 2016-01-04 ENCOUNTER — Encounter: Payer: Self-pay | Admitting: Internal Medicine

## 2016-01-04 ENCOUNTER — Non-Acute Institutional Stay (SKILLED_NURSING_FACILITY): Payer: Medicare HMO | Admitting: Internal Medicine

## 2016-01-04 DIAGNOSIS — S86811S Strain of other muscle(s) and tendon(s) at lower leg level, right leg, sequela: Secondary | ICD-10-CM

## 2016-01-04 DIAGNOSIS — J3089 Other allergic rhinitis: Secondary | ICD-10-CM

## 2016-01-04 DIAGNOSIS — E785 Hyperlipidemia, unspecified: Secondary | ICD-10-CM

## 2016-01-04 DIAGNOSIS — F411 Generalized anxiety disorder: Secondary | ICD-10-CM | POA: Diagnosis not present

## 2016-01-04 DIAGNOSIS — I10 Essential (primary) hypertension: Secondary | ICD-10-CM

## 2016-01-04 DIAGNOSIS — D62 Acute posthemorrhagic anemia: Secondary | ICD-10-CM | POA: Diagnosis not present

## 2016-01-04 DIAGNOSIS — M1711 Unilateral primary osteoarthritis, right knee: Secondary | ICD-10-CM | POA: Diagnosis not present

## 2016-01-04 DIAGNOSIS — E119 Type 2 diabetes mellitus without complications: Secondary | ICD-10-CM

## 2016-01-04 DIAGNOSIS — K5900 Constipation, unspecified: Secondary | ICD-10-CM

## 2016-01-04 DIAGNOSIS — G4700 Insomnia, unspecified: Secondary | ICD-10-CM | POA: Diagnosis not present

## 2016-01-04 DIAGNOSIS — R2681 Unsteadiness on feet: Secondary | ICD-10-CM | POA: Diagnosis not present

## 2016-01-04 DIAGNOSIS — J4520 Mild intermittent asthma, uncomplicated: Secondary | ICD-10-CM

## 2016-01-04 DIAGNOSIS — D72829 Elevated white blood cell count, unspecified: Secondary | ICD-10-CM

## 2016-01-04 DIAGNOSIS — K5909 Other constipation: Secondary | ICD-10-CM

## 2016-01-04 MED ORDER — ZOLPIDEM TARTRATE 5 MG PO TABS: 5.0000 mg | ORAL_TABLET | Freq: Every day | ORAL | 5 refills | Status: DC

## 2016-01-04 NOTE — Progress Notes (Signed)
LOCATION: [facility]  PCP: Dorrene German, MD   Code Status: Full Code  Goals of care: Advanced Directive information Advanced Directives 12/17/2015  Does patient have an advance directive? No  Would patient like information on creating an advanced directive? No - patient declined information       Extended Emergency Contact Information Primary Emergency Contact: Lenn [place] Home Phone: [phone] Mobile Phone: [phone] Relation: Daughter   Allergies  Allergen Reactions  . Bactrim [Sulfamethoxazole-Trimethoprim] Swelling    Tongue swells  . Ivp Dye [Iodinated Diagnostic Agents] Hives  . Clonazepam     dizziness  . Sulfa Antibiotics Swelling  . Codeine Itching  . Other Itching and Rash    Perfume  . Vicodin [Hydrocodone-Acetaminophen] Itching    Chief Complaint  Patient presents with  . New Admit To SNF    New Admission     HPI:  Patient is a 70 y.o. female seen today for short term rehabilitation post hospital admission from 12/28/15-01/01/16 with right knee OA. She underwent right toal knee arthroplasty. Post surgery, she had right patellar tendon rupture and underwent irrigation, debridement and rupture repair. She is seen in her room today.   Review of Systems:  Constitutional: Negative for fever, chills, diaphoresis.  HENT: Negative for headache, congestion, nasal discharge Eyes: Negative for blurred vision, double vision and discharge. Wears glasses.  Respiratory: Negative for cough, shortness of breath and wheezing.   Cardiovascular: Negative for chest pain, palpitations, leg swelling.  Gastrointestinal: Negative for heartburn, nausea, vomiting, abdominal pain. Last bowel movement was 2 days ago Genitourinary: Negative for dysuria and flank pain.  Musculoskeletal: Negative for back pain, fall in the facility.  Skin: Negative for itching, rash.  Neurological: Negative for dizziness. Psychiatric/Behavioral:  Negative for depression   Past Medical History:  Diagnosis Date  . Anemia    in the past  . Asthma    uses Symbicort daily and ALbuterol as needed  . Chronic back pain   . Complication of anesthesia    slow to wake up  . Constipation   . Contact dermatitis due to chemicals   . Cough    nonproductive and without fever  . Depression    but doesn't take any meds   . Diabetes mellitus without complication (HCC)    takes Metformin daily  . Eczema   . Fall during current hospitalization 12/30/2015 12/30/2015  . GERD (gastroesophageal reflux disease)    doesn't take any meds  . Glaucoma    uses eye drops daily  . Heart murmur    states it was told to her as a younger woman, no problems that she knows   . History of bronchitis a yr ago  . HLD (hyperlipidemia)    takes Pravastatin daily  . Hypertension    takes Losartan-HCTZ daily  . Insomnia    takes Ambien nightly   . Joint pain   . Joint swelling   . Muscle spasm    takes Baclofen daily as needed  . Osteoarthritis    knees  . Primary localized osteoarthritis of left knee   . Primary localized osteoarthritis of right knee 12/17/2015  . Pulmonary nodule   . Rupture of right patellar tendon 12/31/2015   Patient fell the second evening after total knee replacement and suffer and open patella tendon rupture of her right knee.  Surgically repaired after an extensive irrigation and debridement on 12/30/2015  . s/p right total knee replacement on 12/28/2015  12/28/2015  . Urinary frequency   . Urinary urgency    Past Surgical History:  Procedure Laterality Date  . GALLBLADDER SURGERY  1997  . IRRIGATION AND DEBRIDEMENT KNEE Right 12/30/2015   Procedure: IRRIGATION AND DEBRIDEMENT KNEE;  Surgeon: Salvatore Marvel, MD;  Location: [facility] OR;  Service: Orthopedics;  Laterality: Right;  . PATELLAR TENDON REPAIR Right 12/30/2015   Procedure: PATELLA TENDON REPAIR;  Surgeon: Salvatore Marvel, MD;  Location: [facility] OR;  Service: Orthopedics;  Laterality:  Right;  . TOTAL KNEE ARTHROPLASTY Left 01/05/2015   Procedure: TOTAL KNEE ARTHROPLASTY;  Surgeon: Salvatore Marvel, MD;  Location: [facility] OR;  Service: Orthopedics;  Laterality: Left;  . TOTAL KNEE ARTHROPLASTY Right 12/28/2015   Procedure: TOTAL KNEE ARTHROPLASTY;  Surgeon: Salvatore Marvel, MD;  Location: [facility] OR;  Service: Orthopedics;  Laterality: Right;   Social History:   reports that she has quit smoking. She has never used smokeless tobacco. She reports that she does not drink alcohol or use drugs.  Family History  Problem Relation Age of Onset  . Heart attack Father   . Glaucoma Mother   . Diabetes Mellitus II Mother   . Hypertension Sister   . Cancer Maternal Grandmother   . Colon cancer Maternal Grandmother     Medications:   Medication List       Accurate as of 01/04/16 11:58 AM. Always use your most recent med list.          acetaminophen 500 MG tablet Commonly known as:  TYLENOL Take 500 mg by mouth at bedtime.   albuterol 108 (90 Base) MCG/ACT inhaler Commonly known as:  PROVENTIL HFA;VENTOLIN HFA Inhale 2 puffs into the lungs every 6 (six) hours as needed for wheezing or shortness of breath.   apixaban 2.5 MG Tabs tablet Commonly known as:  ELIQUIS 1 tab po BID to prevent blood clots   atorvastatin 10 MG tablet Commonly known as:  LIPITOR Take 10 mg by mouth at bedtime.   budesonide-formoterol 80-4.5 MCG/ACT inhaler Commonly known as:  SYMBICORT Inhale 2 puffs into the lungs 2 (two) times daily.   cetirizine 10 MG tablet Commonly known as:  ZYRTEC Take 10 mg by mouth daily.   docusate sodium 100 MG capsule Commonly known as:  COLACE Take 1 capsule (100 mg total) by mouth every 12 (twelve) hours.   latanoprost 0.005 % ophthalmic solution Commonly known as:  XALATAN Place 1 drop into both eyes at bedtime.   linaclotide 145 MCG Caps capsule Commonly known as:  LINZESS Take 1 capsule (145 mcg total) by mouth daily before breakfast.   LORazepam 0.5 MG  tablet Commonly known as:  ATIVAN Take 0.5 mg by mouth every 6 (six) hours as needed for anxiety.   losartan-hydrochlorothiazide 100-12.5 MG tablet Commonly known as:  HYZAAR Take 0.5 tablets by mouth daily.   metFORMIN 500 MG tablet Commonly known as:  GLUCOPHAGE Take 500 mg by mouth 2 (two) times daily with a meal.   oxycodone 5 MG capsule Commonly known as:  OXY-IR Take 5 mg by mouth every 4 (four) hours as needed for pain (Take 1-2 tablets (5-10 mg) for moderate to severe pain).   polyethylene glycol packet Commonly known as:  MIRALAX / GLYCOLAX 17grams in 6 oz of water twice a day until bowel movement.  LAXITIVE.  Restart if two days since last bowel movement       Immunizations:  There is no immunization history on file for this patient.   Physical Exam: Vitals:  01/04/16 1146  BP: 127/61  Pulse: 92  Resp: 19  Temp: 98.4 F (36.9 C)  TempSrc: Oral  SpO2: 95%  Weight: 172 lb 8 oz (78.2 kg)  Height: 5\' 4"  (1.626 m)   Body mass index is 29.61 kg/m.  General- elderly female, well built, in no acute distress Head- normocephalic, atraumatic Nose- no nasal discharge Throat- moist mucus membrane Eyes- PERRLA, EOMI, no pallor, no icterus, no discharge, normal conjunctiva, normal sclera Neck- no cervical lymphadenopathy Cardiovascular- normal s1,s2, no murmur, trace leg edema Respiratory- bilateral clear to auscultation, no wheeze, no rhonchi, no crackles, no use of accessory muscles Abdomen- bowel sounds present, soft, non tender Musculoskeletal- able to move all 4 extremities, right leg limited range of motion, right leg in ace wrap Neurological- alert and oriented to person, place and time Skin- warm and dry Psychiatry- normal mood and affect    Labs reviewed: Basic Metabolic Panel:  Recent Labs  16/10/96 0517 12/30/15 0503 12/31/15 0411  NA 137 136 138  K 4.7 4.8 4.3  CL 107 106 107  CO2 22 22 26   GLUCOSE 249* 132* 76  BUN 11 20 19     CREATININE 0.96 1.08* 0.94  CALCIUM 8.7* 8.9 8.4*   Liver Function Tests:  Recent Labs  11/20/15 1224 12/07/15 0024 12/17/15 1207  AST 16 17 17   ALT 11* 11* 12*  ALKPHOS 86 86 92  BILITOT 0.7 0.5 0.6  PROT 7.3 7.0 7.0  ALBUMIN 4.1 4.0 3.8   No results for input(s): LIPASE, AMYLASE in the last 8760 hours. No results for input(s): AMMONIA in the last 8760 hours. CBC:  Recent Labs  11/20/15 1224 12/07/15 0024 12/17/15 1207 12/29/15 0517 12/30/15 0503 12/31/15 0411  WBC 4.9 7.0 6.2 10.4 13.7* 11.9*  NEUTROABS 2.2 3.0 3.1  --   --   --   HGB 12.4 12.8 12.1 9.3* 8.9* 8.7*  HCT 37.3 38.5 36.3 28.8* 26.3* 27.3*  MCV 81.4 83.0 83.4 84.0 81.9 85.3  PLT 332 279 357 262 240 276   Cardiac Enzymes: No results for input(s): CKTOTAL, CKMB, CKMBINDEX, TROPONINI in the last 8760 hours. BNP: Invalid input(s): POCBNP CBG:  Recent Labs  12/31/15 1635 12/31/15 2053 01/01/16 0706  GLUCAP 106* 157* 83    Radiological Exams: Ct Abdomen Pelvis Wo Contrast  Result Date: 12/07/2015 CLINICAL DATA:  Abdominal pain.  Constipation. EXAM: CT ABDOMEN AND PELVIS WITHOUT CONTRAST TECHNIQUE: Multidetector CT imaging of the abdomen and pelvis was performed following the standard protocol without IV contrast. COMPARISON:  None. FINDINGS: There is prior cholecystectomy. There are unremarkable unenhanced appearances of the liver and bile ducts. The pancreas, spleen, adrenals and kidneys are unremarkable. Ureters and urinary bladder appear normal. There is a hiatal hernia. Stomach and small bowel appear unremarkable. No evidence of appendicitis. There is colonic diverticulosis. Mild stranding opacity around the mid descending colon may represent minimal changes of diverticulitis. No bowel obstruction. No extraluminal air. There is a 3 cm calcified uterine fibroid, and several additional fibroids are suspected. Adnexal regions are unremarkable. The abdominal aorta is normal in caliber with mild  atherosclerotic calcification. There is no significant abnormality in the lower chest. There is no significant musculoskeletal lesion. There is grade 1 spondylolisthesis at L4-5. IMPRESSION: 1. Diverticulosis. Mild stranding opacity around the mid descending colon may represent minimal changes of diverticulitis. No abscess. No extraluminal air. No bowel obstruction. 2. Uterine fibroids. 3. Hiatal hernia. Electronically Signed   By: Ellery Plunk M.D.   On: 12/07/2015  03:35   Dg Knee Right Port  Result Date: 12/30/2015 CLINICAL DATA:  70 year old female with history of trauma from a fall yesterday evening with injury to the right knee. History of right knee total arthroplasty two days ago. EXAM: PORTABLE RIGHT KNEE - 1-2 VIEW COMPARISON:  No priors. FINDINGS: Postoperative changes of total knee arthroplasty are noted. The femoral and tibial components of the prosthesis appear properly seated without periprosthetic fracture. There is high density fluid in the joint space. Patella alta. Patellar tendon is not well visualized. IMPRESSION: 1. Findings are concerning for probable ruptured patellar tendon with hemarthrosis. Clinical correlation is recommended. Electronically Signed   By: Trudie Reed M.D.   On: 12/30/2015 12:43   Dg Abd Acute W/chest  Result Date: 12/13/2015 CLINICAL DATA:  Constipation 1 week. EXAM: DG ABDOMEN ACUTE W/ 1V CHEST COMPARISON:  CT 12/07/2015 FINDINGS: Lungs are adequately inflated without consolidation or effusion. Calcified granuloma over the right base. Cardiomediastinal silhouette is within normal. There is calcified plaque over the aortic arch. Degenerative changes of the spine. Bowel gas pattern is nonobstructive. There is spur few scattered air-fluid levels over the colon. No free peritoneal air. Surgical clips over the right upper quadrant. Calcified uterine fibroid over the left pelvis. There are degenerative changes of the spine, sacroiliac joints, symphysis pubis  joint and hips. IMPRESSION: Nonobstructive bowel gas pattern. No acute cardiopulmonary disease. Electronically Signed   By: Elberta Fortis M.D.   On: 12/13/2015 17:40    Assessment/Plan  Unsteady gait Will have patient work with PT/OT as tolerated to regain strength and restore function.  Fall precautions are in place.  Right knee OA S/p right total knee arthroplasty. Has orthopedics follow up. To work with therapy team. Continue oxyIR 5 mg 1-2 tab q4h prn pain. Continue apixaban for dvt prophylaxis  Right patellar rupture S/p repair, has orthopedic follow up on 01/07/16. Will have her work with physical therapy and occupational therapy team to help with gait training and muscle strengthening exercises.fall precautions. Skin care. Encourage to be out of bed. Continue pain meds as above and dvt prophylaxis.   Leukocytosis Afebrile, monitor cbc, likely reactive leukocytosis  Blood loss anemia Post op, monitor cbc  Dm type 2 Lab Results  Component Value Date   HGBA1C 5.7 (H) 11/20/2015   Monitor cbg. Continue metformin 500 mg bid  Allergic rhinitis Continue zyrtec  HLD Continue lipitor 10 mg daily  Insomnia Continue her ambien  Chronic constipation Continue linzess 145 mcg daily with colace 100 mg bid, miralax 17 g bid  HTN Monitor bp, continue losartan-hctz 100-12.5 mg half a tab po daily, check bmp  GAD Continue ativan 0.5 mg q6h prn and get psychiatry consult  Asthma Breathing stable, continue her symbicort and prn albuterol    Goals of care: short term rehabilitation   Labs/tests ordered: cbc, cmp 01/05/16   Family/ staff Communication: reviewed care plan with patient and nursing supervisor    Oneal Grout, MD Internal Medicine [facility] [address] [place], [place] [address] Cell Phone (Monday-Friday 8 am - 5 pm): [phone] On Call: [phone] and follow prompts after 5 pm and on weekends Office Phone:  [phone] Office Fax: [phone]

## 2016-01-04 NOTE — Telephone Encounter (Signed)
Rx faxed to [facility] @ [phone], phone number [phone]  

## 2016-01-15 ENCOUNTER — Other Ambulatory Visit: Payer: Self-pay | Admitting: *Deleted

## 2016-01-15 ENCOUNTER — Non-Acute Institutional Stay (SKILLED_NURSING_FACILITY): Payer: Medicare HMO | Admitting: Family

## 2016-01-15 DIAGNOSIS — F259 Schizoaffective disorder, unspecified: Secondary | ICD-10-CM | POA: Diagnosis not present

## 2016-01-15 DIAGNOSIS — Z96651 Presence of right artificial knee joint: Secondary | ICD-10-CM | POA: Diagnosis not present

## 2016-01-15 DIAGNOSIS — I10 Essential (primary) hypertension: Secondary | ICD-10-CM | POA: Diagnosis not present

## 2016-01-15 DIAGNOSIS — J4530 Mild persistent asthma, uncomplicated: Secondary | ICD-10-CM | POA: Diagnosis not present

## 2016-01-15 DIAGNOSIS — E119 Type 2 diabetes mellitus without complications: Secondary | ICD-10-CM | POA: Diagnosis not present

## 2016-01-15 DIAGNOSIS — R269 Unspecified abnormalities of gait and mobility: Secondary | ICD-10-CM

## 2016-01-15 DIAGNOSIS — E785 Hyperlipidemia, unspecified: Secondary | ICD-10-CM

## 2016-01-15 DIAGNOSIS — G4700 Insomnia, unspecified: Secondary | ICD-10-CM

## 2016-01-15 DIAGNOSIS — H409 Unspecified glaucoma: Secondary | ICD-10-CM | POA: Diagnosis not present

## 2016-01-15 MED ORDER — OXYCODONE HCL 5 MG PO CAPS: 5.0000 mg | ORAL_CAPSULE | ORAL | 0 refills | Status: DC | PRN

## 2016-01-15 NOTE — Progress Notes (Signed)
Location:   [facility]    Place of Service:  SNF [phone]) Provider:  Reinette Cuneo FNP-C   Dorrene German, MD  Patient Care Team: Fleet Contras, MD as PCP - General (Internal Medicine)  Extended Emergency Contact Information Primary Emergency Contact: Rosezena Sensor of [place] Home Phone: [phone] Mobile Phone: [phone] Relation: Daughter  Code Status:  Full Code  Goals of care: Advanced Directive information Advanced Directives 12/17/2015  Does patient have an advance directive? No  Would patient like information on creating an advanced directive? No - patient declined information     Chief Complaint  Patient presents with  . Discharge Note    HPI:  Pt is a 70 y.o. female seen today at [facility] for an acute visit for discharge home. She was here for short term rehabilitation post hospital admission from 12/28/15-01/01/16 with right knee OA. She underwent right toal knee arthroplasty. Post surgery, she had right patellar tendon rupture and underwent irrigation, debridement and rupture repair.She has a medical history of type 2 DM, Hyperlipidemia, Asthma, constipation, GERD,Glaucoma, Depression among others. She is seen in her room today with her sister at bedside. She request her Ativan and colace changed to as needed. She was seen by Ortho specialist Dr. Thurston Hole for follow up right knee incision Keflex 500 mg Tablet QiD started " due to drainage" per patient. She wears a full right leg cast. She has a follow up appointment with Ortho 01/21/2016. She has worked well with PT/OT and ambulates at times by herself on facility hallway with front wheel walker. She states right leg pain under control with current regimen rates pain 7/10 prior to taking medication. She will be discharged home with outpatient  PT/OT to continue with ROM, Exercise, Gait stability and muscle strengthening. She does not require any DME states has own  Rollator at home. She will be discharge home with medication from facility. Prescription medication will be written x 1 month then patient to follow up with PCP in 1-2 weeks. Facility staff report no new concerns.       Past Medical History:  Diagnosis Date  . Anemia    in the past  . Asthma    uses Symbicort daily and ALbuterol as needed  . Chronic back pain   . Complication of anesthesia    slow to wake up  . Constipation   . Contact dermatitis due to chemicals   . Cough    nonproductive and without fever  . Depression    but doesn't take any meds   . Diabetes mellitus without complication (HCC)    takes Metformin daily  . Eczema   . Fall during current hospitalization 12/30/2015 12/30/2015  . GERD (gastroesophageal reflux disease)    doesn't take any meds  . Glaucoma    uses eye drops daily  . Heart murmur    states it was told to her as a younger woman, no problems that she knows   . History of bronchitis a yr ago  . HLD (hyperlipidemia)    takes Pravastatin daily  . Hypertension    takes Losartan-HCTZ daily  . Insomnia    takes Ambien nightly   . Joint pain   . Joint swelling   . Muscle spasm    takes Baclofen daily as needed  . Osteoarthritis    knees  . Primary localized osteoarthritis of left knee   . Primary localized osteoarthritis of right knee 12/17/2015  .  Pulmonary nodule   . Rupture of right patellar tendon 12/31/2015   Patient fell the second evening after total knee replacement and suffer and open patella tendon rupture of her right knee.  Surgically repaired after an extensive irrigation and debridement on 12/30/2015  . 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 Urinary frequency   . Urinary urgency    Past Surgical History:  Procedure Laterality Date  . GALLBLADDER SURGERY  1997  . IRRIGATION AND DEBRIDEMENT KNEE Right 12/30/2015   Procedure: IRRIGATION AND DEBRIDEMENT KNEE;  Surgeon: Salvatore Marvel, MD;  Location: [facility] OR;  Service:  Orthopedics;  Laterality: Right;  . PATELLAR TENDON REPAIR Right 12/30/2015   Procedure: PATELLA TENDON REPAIR;  Surgeon: Salvatore Marvel, MD;  Location: [facility] OR;  Service: Orthopedics;  Laterality: Right;  . TOTAL KNEE ARTHROPLASTY Left 01/05/2015   Procedure: TOTAL KNEE ARTHROPLASTY;  Surgeon: Salvatore Marvel, MD;  Location: [facility] OR;  Service: Orthopedics;  Laterality: Left;  . TOTAL KNEE ARTHROPLASTY Right 12/28/2015   Procedure: TOTAL KNEE ARTHROPLASTY;  Surgeon: Salvatore Marvel, MD;  Location: [facility] OR;  Service: Orthopedics;  Laterality: Right;    Allergies  Allergen Reactions  . Bactrim [Sulfamethoxazole-Trimethoprim] Swelling    Tongue swells  . Ivp Dye [Iodinated Diagnostic Agents] Hives  . Clonazepam     dizziness  . Sulfa Antibiotics Swelling  . Codeine Itching  . Other Itching and Rash    Perfume  . Vicodin [Hydrocodone-Acetaminophen] Itching      Medication List       Accurate as of 01/15/16  4:10 PM. Always use your most recent med list.          acetaminophen 500 MG tablet Commonly known as:  TYLENOL Take 500 mg by mouth at bedtime.   albuterol 108 (90 Base) MCG/ACT inhaler Commonly known as:  PROVENTIL HFA;VENTOLIN HFA Inhale 2 puffs into the lungs every 6 (six) hours as needed for wheezing or shortness of breath.   apixaban 2.5 MG Tabs tablet Commonly known as:  ELIQUIS 1 tab po BID to prevent blood clots   atorvastatin 10 MG tablet Commonly known as:  LIPITOR Take 10 mg by mouth at bedtime.   budesonide-formoterol 80-4.5 MCG/ACT inhaler Commonly known as:  SYMBICORT Inhale 2 puffs into the lungs 2 (two) times daily.   cetirizine 10 MG tablet Commonly known as:  ZYRTEC Take 10 mg by mouth daily.   docusate sodium 100 MG capsule Commonly known as:  COLACE Take 1 capsule (100 mg total) by mouth every 12 (twelve) hours.   latanoprost 0.005 % ophthalmic solution Commonly known as:  XALATAN Place 1 drop into both eyes at bedtime.   linaclotide 145 MCG Caps  capsule Commonly known as:  LINZESS Take 1 capsule (145 mcg total) by mouth daily before breakfast.   LORazepam 0.5 MG tablet Commonly known as:  ATIVAN Take 0.5 mg by mouth every 6 (six) hours as needed for anxiety.   losartan-hydrochlorothiazide 100-12.5 MG tablet Commonly known as:  HYZAAR Take 0.5 tablets by mouth daily.   metFORMIN 500 MG tablet Commonly known as:  GLUCOPHAGE Take 500 mg by mouth 2 (two) times daily with a meal.   oxycodone 5 MG capsule Commonly known as:  OXY-IR Take 1 capsule (5 mg total) by mouth every 4 (four) hours as needed for pain (Take 1-2 tablets (5-10 mg) for moderate to severe pain).   polyethylene glycol packet Commonly known as:  MIRALAX / GLYCOLAX 17grams in 6 oz of water twice  a day until bowel movement.  LAXITIVE.  Restart if two days since last bowel movement   zolpidem 5 MG tablet Commonly known as:  AMBIEN Take 1 tablet (5 mg total) by mouth at bedtime.       Review of Systems  Constitutional: Negative for activity change, appetite change, chills, fatigue and fever.  HENT: Negative for congestion, rhinorrhea, sinus pressure, sneezing and sore throat.   Eyes: Positive for photophobia. Negative for visual disturbance.       Wears sun glasses due to glaucoma   Respiratory: Negative for cough, chest tightness, shortness of breath and wheezing.   Cardiovascular: Negative for chest pain, palpitations and leg swelling.  Gastrointestinal: Negative for abdominal distention, abdominal pain, constipation, diarrhea, nausea and vomiting.  Endocrine: Negative for cold intolerance, heat intolerance, polydipsia, polyphagia and polyuria.  Genitourinary: Negative for dysuria, flank pain, frequency and urgency.  Musculoskeletal: Positive for gait problem.       Right leg pain rating 7/10 on scale prior to pain meds. Pain meds effective.   Skin: Negative for color change, pallor and rash.  Neurological: Negative for dizziness, seizures, syncope,  light-headedness and headaches.  Hematological: Does not bruise/bleed easily.  Psychiatric/Behavioral: Negative for agitation, confusion, hallucinations and sleep disturbance.     There is no immunization history on file for this patient. Pertinent  Health Maintenance Due  Topic Date Due  . FOOT EXAM  01/21/1956  . OPHTHALMOLOGY EXAM  01/21/1956  . DEXA SCAN  01/22/2012  . PNA vac Low Risk Adult (1 of 2 - PCV13) 01/22/2012  . INFLUENZA VACCINE  01/12/2016  . HEMOGLOBIN A1C  05/21/2016  . MAMMOGRAM  09/29/2017  . COLONOSCOPY  09/08/2025   Fall Risk  01/16/2015 10/28/2014 09/30/2014 09/30/2014  Falls in the past year? No Yes Yes No  Number falls in past yr: - 2 or more 1 -  Injury with Fall? - No Yes -  Risk for fall due to : - Impaired balance/gait;History of fall(s) - Impaired balance/gait  Risk for fall due to (comments): - - - because of knees   Follow up - Education provided;Falls prevention discussed Falls prevention discussed -   Functional Status Survey:    Vitals:   01/15/16 1531  BP: 126/62  Pulse: 82  Resp: 20  Temp: 97.6 F (36.4 C)  SpO2: 98%  Weight: 182 lb (82.6 kg)  Height: 5\' 4"  (1.626 m)   Body mass index is 31.24 kg/m. Physical Exam  Constitutional: She is oriented to person, place, and time. She appears well-developed and well-nourished. No distress.  HENT:  Head: Normocephalic.  Mouth/Throat: Oropharynx is clear and moist. No oropharyngeal exudate.  Eyes: Conjunctivae and EOM are normal. Pupils are equal, round, and reactive to light. Right eye exhibits no discharge. Left eye exhibits no discharge. No scleral icterus.  Neck: Normal range of motion. No JVD present. No thyromegaly present.  Cardiovascular: Intact distal pulses.  Exam reveals no gallop and no friction rub.   Murmur heard. Pulmonary/Chest: Effort normal and breath sounds normal. No respiratory distress. She has no wheezes. She has no rales.  Abdominal: Soft. Bowel sounds are normal. She  exhibits no distension. There is no tenderness. There is no rebound and no guarding.  Genitourinary:  Genitourinary Comments: Continent   Musculoskeletal: She exhibits no edema, tenderness or deformity.  Moves all X 4 extremities without difficulties except right leg limited due to full leg cast in place. Toes pink and moves without any difficulty.   Lymphadenopathy:  She has no cervical adenopathy.  Neurological: She is oriented to person, place, and time.  Skin: Skin is warm and dry. No rash noted. No erythema. No pallor.  Unable to visualize right knee incision due to full leg cast in place. Upper thigh area without any signs of infections.   Psychiatric: She has a normal mood and affect.    Labs reviewed:  Recent Labs  12/29/15 0517 12/30/15 0503 12/31/15 0411  NA 137 136 138  K 4.7 4.8 4.3  CL 107 106 107  CO2 22 22 26   GLUCOSE 249* 132* 76  BUN 11 20 19   CREATININE 0.96 1.08* 0.94  CALCIUM 8.7* 8.9 8.4*    Recent Labs  11/20/15 1224 12/07/15 0024 12/17/15 1207  AST 16 17 17   ALT 11* 11* 12*  ALKPHOS 86 86 92  BILITOT 0.7 0.5 0.6  PROT 7.3 7.0 7.0  ALBUMIN 4.1 4.0 3.8    Recent Labs  11/20/15 1224 12/07/15 0024 12/17/15 1207 12/29/15 0517 12/30/15 0503 12/31/15 0411  WBC 4.9 7.0 6.2 10.4 13.7* 11.9*  NEUTROABS 2.2 3.0 3.1  --   --   --   HGB 12.4 12.8 12.1 9.3* 8.9* 8.7*  HCT 37.3 38.5 36.3 28.8* 26.3* 27.3*  MCV 81.4 83.0 83.4 84.0 81.9 85.3  PLT 332 279 357 262 240 276   No results found for: TSH Lab Results  Component Value Date   HGBA1C 5.7 (H) 11/20/2015   No results found for: CHOL, HDL, LDLCALC, LDLDIRECT, TRIG, CHOLHDL  Significant Diagnostic Results in last 30 days:  Dg Knee Right Port  Result Date: 12/30/2015 CLINICAL DATA:  70 year old female with history of trauma from a fall yesterday evening with injury to the right knee. History of right knee total arthroplasty two days ago. EXAM: PORTABLE RIGHT KNEE - 1-2 VIEW COMPARISON:  No  priors. FINDINGS: Postoperative changes of total knee arthroplasty are noted. The femoral and tibial components of the prosthesis appear properly seated without periprosthetic fracture. There is high density fluid in the joint space. Patella alta. Patellar tendon is not well visualized. IMPRESSION: 1. Findings are concerning for probable ruptured patellar tendon with hemarthrosis. Clinical correlation is recommended. Electronically Signed   By: Trudie Reed M.D.   On: 12/30/2015 12:43    Assessment/Plan 1. Essential hypertension B/p stable. Continue on Hyzaar 100-12.5 mg Tablet 1/2 tablet daily. BMP in 1-2 weeks with PCP.   2. Status post total right knee replacement  post short term rehabilitation post hospital admission from 12/28/15-01/01/16 with right knee OA. She underwent right toal knee arthroplasty. Post surgery, she had right patellar tendon rupture and underwent irrigation, debridement and rupture repair.Has worked well PT/OT. Right leg pain under control. Continue current regimen and wean off as tolerated. Continue Eliquis 2.5 mg for DVT prophylaxis to be managed by Ortho.Will discharge on outpatient PT/OT. Follow up with Ortho as directed next appt 01/21/2016.No DME required. Has own Rollator at home.   3. Asthma, mild persistent, uncomplicated Asymptomatic. Continue Symbicort 80-4.5 mcg/ACT inhaler and Proventil HFA 108 mcg/inhaler   4. Diabetes mellitus without complication (HCC) CBG's stable in 110's. Continue on Metformin 500 mg Tablet twice daily. Hgb A1C with PCP.   5. HLD (hyperlipidemia) Continue on Atorvastatin 10 mg Tablet   6. Schizoaffective disorder, unspecified type (HCC) Stable. Continue on Ativan PRN   7. Glaucoma Continue on current eye drops.   8. Insomnia Continue on Ambien 5 mg Tablet. Consider D/c and trying Melatonin to prevent falls.  9.Abnormal  Gait   Has worked well with PT/ OT. Will discharge home outpatient PT/OT to continue with ROM, Exercise, Gait  stability and muscle strengthening. Has own Rollator. Fall and safety precautions.    Patient is being discharged with the following home health services:     Outpatient PT/OT    Patient is being discharged with the following durable medical equipment: No DME required has own rolator.    Patient has been advised to f/u with their PCP in 1-2 weeks to bring them up to date on their rehab stay.  Social services at facility was responsible for arranging this appointment.  Pt was provided with a 30 day supply of prescriptions for medications and refills must be obtained from their PCP.  For controlled substances, a more limited supply may be provided adequate until PCP appointment only.   Family/ staff Communication: Reviewed plan with patient and facility Nurse supervisor.  Labs/tests ordered:  CBC, BMP in 1-2 weeks with PCP

## 2016-01-15 NOTE — Telephone Encounter (Signed)
[facility] [phone] Fax: [phone]  

## 2016-01-19 ENCOUNTER — Other Ambulatory Visit: Payer: Self-pay

## 2016-01-19 DIAGNOSIS — E118 Type 2 diabetes mellitus with unspecified complications: Secondary | ICD-10-CM

## 2016-01-19 DIAGNOSIS — Z96651 Presence of right artificial knee joint: Secondary | ICD-10-CM

## 2016-01-19 NOTE — Patient Outreach (Signed)
[facility] Care Management  01/19/2016  Brooke Davidson 07-11-1945 [id number]   Referral Date: 01-18-16 Referral Source: Dr. Albertina ParrAvbuere's office called in referral as patient recent discharge from the nursing home.  Referral Reason: Recent discharge Outreach Attempt: First Attempt Successful Social: Patient lives alone.  Patient states she is still in her cast but states she is to see orthopedic today for cast removal.  Patient does have the help of her sister.   Conditions: Patient underwent knee surgery on 12-28-15 was discharged from the hospital on 01-01-16. Patient then [facility].  Patient discharged from the nursing home on 01-18-16.  Patient also admits to diabetes and hypertension.  Medications: Patient reports she was discharged with medications from the nursing home but needs help with medication organization.   Services: Patient agreeable to nurse for transition of care  Plan: RN Health Coach will refer patient to community nurse for transition of care.    Bary Lericheionne J Elysha Daw, RN, MSN [facility] Care [facility] RN Telephonic Health Coach [phone]

## 2016-01-22 ENCOUNTER — Other Ambulatory Visit: Payer: Self-pay

## 2016-01-22 NOTE — Patient Outreach (Signed)
[facility] Care Management  01/22/2016  Glean SalvoSandra H Bua 05-02-1946 [id number]   Telephone call to patient to notify patient that [facility] will not be following her and that Dr. Allena KatzAvbeure's office made aware.  She verbalized understanding and states [facility].   Bary Lericheionne J Latrenda Irani, RN, MSN [facility] Care [facility] RN Telephonic Health Coach [phone]

## 2016-02-18 ENCOUNTER — Institutional Professional Consult (permissible substitution): Payer: Commercial Managed Care - HMO | Admitting: Family Medicine

## 2016-04-11 ENCOUNTER — Encounter (HOSPITAL_COMMUNITY): Payer: Self-pay | Admitting: Family Medicine

## 2016-04-11 ENCOUNTER — Ambulatory Visit (HOSPITAL_COMMUNITY)
Admission: EM | Admit: 2016-04-11 | Discharge: 2016-04-11 | Disposition: A | Discharge status: Home / Self Care | Payer: Medicare HMO | Attending: Family Medicine | Admitting: Family Medicine

## 2016-04-11 DIAGNOSIS — E785 Hyperlipidemia, unspecified: Secondary | ICD-10-CM | POA: Diagnosis not present

## 2016-04-11 DIAGNOSIS — R011 Cardiac murmur, unspecified: Secondary | ICD-10-CM | POA: Diagnosis not present

## 2016-04-11 DIAGNOSIS — Z7984 Long term (current) use of oral hypoglycemic drugs: Secondary | ICD-10-CM | POA: Diagnosis not present

## 2016-04-11 DIAGNOSIS — G8929 Other chronic pain: Secondary | ICD-10-CM | POA: Diagnosis not present

## 2016-04-11 DIAGNOSIS — M546 Pain in thoracic spine: Secondary | ICD-10-CM | POA: Insufficient documentation

## 2016-04-11 DIAGNOSIS — K219 Gastro-esophageal reflux disease without esophagitis: Secondary | ICD-10-CM | POA: Insufficient documentation

## 2016-04-11 DIAGNOSIS — D649 Anemia, unspecified: Secondary | ICD-10-CM | POA: Diagnosis not present

## 2016-04-11 DIAGNOSIS — Z96651 Presence of right artificial knee joint: Secondary | ICD-10-CM | POA: Diagnosis not present

## 2016-04-11 DIAGNOSIS — S39011A Strain of muscle, fascia and tendon of abdomen, initial encounter: Secondary | ICD-10-CM | POA: Diagnosis not present

## 2016-04-11 DIAGNOSIS — R109 Unspecified abdominal pain: Secondary | ICD-10-CM | POA: Diagnosis present

## 2016-04-11 DIAGNOSIS — L259 Unspecified contact dermatitis, unspecified cause: Secondary | ICD-10-CM | POA: Insufficient documentation

## 2016-04-11 DIAGNOSIS — Z7901 Long term (current) use of anticoagulants: Secondary | ICD-10-CM | POA: Diagnosis not present

## 2016-04-11 DIAGNOSIS — I10 Essential (primary) hypertension: Secondary | ICD-10-CM | POA: Diagnosis not present

## 2016-04-11 DIAGNOSIS — E119 Type 2 diabetes mellitus without complications: Secondary | ICD-10-CM | POA: Diagnosis not present

## 2016-04-11 DIAGNOSIS — F329 Major depressive disorder, single episode, unspecified: Secondary | ICD-10-CM | POA: Insufficient documentation

## 2016-04-11 DIAGNOSIS — H409 Unspecified glaucoma: Secondary | ICD-10-CM | POA: Diagnosis not present

## 2016-04-11 DIAGNOSIS — G4700 Insomnia, unspecified: Secondary | ICD-10-CM | POA: Diagnosis not present

## 2016-04-11 DIAGNOSIS — J45909 Unspecified asthma, uncomplicated: Secondary | ICD-10-CM | POA: Diagnosis not present

## 2016-04-11 DIAGNOSIS — R911 Solitary pulmonary nodule: Secondary | ICD-10-CM | POA: Diagnosis not present

## 2016-04-11 DIAGNOSIS — R197 Diarrhea, unspecified: Secondary | ICD-10-CM | POA: Insufficient documentation

## 2016-04-11 DIAGNOSIS — M1711 Unilateral primary osteoarthritis, right knee: Secondary | ICD-10-CM | POA: Insufficient documentation

## 2016-04-11 DIAGNOSIS — F259 Schizoaffective disorder, unspecified: Secondary | ICD-10-CM | POA: Insufficient documentation

## 2016-04-11 LAB — CBC
HCT: 41.4 % (ref 36.0–46.0)
Hemoglobin: 13.8 g/dL (ref 12.0–15.0)
MCH: 26.5 pg (ref 26.0–34.0)
MCHC: 33.3 g/dL (ref 30.0–36.0)
MCV: 79.6 fL (ref 78.0–100.0)
Platelets: 404 10*3/uL — ABNORMAL HIGH (ref 150–400)
RBC: 5.2 MIL/uL — ABNORMAL HIGH (ref 3.87–5.11)
RDW: 14.5 % (ref 11.5–15.5)
WBC: 5.5 10*3/uL (ref 4.0–10.5)

## 2016-04-11 LAB — POCT I-STAT, CHEM 8
BUN: 27 mg/dL — ABNORMAL HIGH (ref 6–20)
CALCIUM ION: 1.24 mmol/L (ref 1.15–1.40)
Chloride: 107 mmol/L (ref 101–111)
Creatinine, Ser: 1.1 mg/dL — ABNORMAL HIGH (ref 0.44–1.00)
GLUCOSE: 103 mg/dL — AB (ref 65–99)
HCT: 45 % (ref 36.0–46.0)
HEMOGLOBIN: 15.3 g/dL — AB (ref 12.0–15.0)
POTASSIUM: 4.6 mmol/L (ref 3.5–5.1)
Sodium: 140 mmol/L (ref 135–145)
TCO2: 23 mmol/L (ref 0–100)

## 2016-04-11 MED ORDER — PREDNISONE 10 MG PO TABS: ORAL_TABLET | ORAL | 0 refills | Status: DC

## 2016-04-11 MED ORDER — METRONIDAZOLE 500 MG PO TABS: 500.0000 mg | ORAL_TABLET | Freq: Two times a day (BID) | ORAL | 0 refills | Status: DC

## 2016-04-11 NOTE — ED Triage Notes (Addendum)
[facility] with upper [facility], she states that she has loose stools on and off since Saturday. She states that she is having back pain in the back, and her eyes are sunken in per patient.

## 2016-04-11 NOTE — ED Provider Notes (Signed)
[facility]    CSN: [id number] Arrival date & time: 04/11/16  1009     History   Chief Complaint Chief Complaint  Patient presents with  . Abdominal Pain    HPI Brooke Davidson is a 70 y.o. female.   This is a 70 year old woman who presents with abdominal pain and diarrhea. Her problem list includes asthma, anemia, GERD, depression, insomnia, hypertension, hyperlipidemia, and diabetes.  Her problems began on Saturday, one day after she had a vigorous workout. She knows that she had back pain which radiated around to her epigastrium. It was burning in quality. It's also steady and not crampy. She denies actually having diarrhea but rather, loose stools.  The pain that she describes in her back and abdomen is positional. She's been using heat and icy packs to help control the pain.  Patient has mild diabetes and takes metformin. Her hypertension is mild and she takes losartan HCT. She takes a sleeping medicine, glaucoma drops, aspirin, and pravastatin for her cholesterol.  Patient's in the process of trying to tone up her muscles      Past Medical History:  Diagnosis Date  . Anemia    in the past  . Asthma    uses Symbicort daily and ALbuterol as needed  . Chronic back pain   . Complication of anesthesia    slow to wake up  . Constipation   . Contact dermatitis due to chemicals   . Cough    nonproductive and without fever  . Depression    but doesn't take any meds   . Diabetes mellitus without complication (HCC)    takes Metformin daily  . Eczema   . Fall during current hospitalization 12/30/2015 12/30/2015  . GERD (gastroesophageal reflux disease)    doesn't take any meds  . Glaucoma    uses eye drops daily  . Heart murmur    states it was told to her as a younger woman, no problems that she knows   . History of bronchitis a yr ago  . HLD (hyperlipidemia)    takes Pravastatin daily  . Hypertension    takes Losartan-HCTZ daily  . Insomnia    takes Ambien nightly   . Joint pain   . Joint swelling   . Muscle spasm    takes Baclofen daily as needed  . Osteoarthritis    knees  . Primary localized osteoarthritis of left knee   . Primary localized osteoarthritis of right knee 12/17/2015  . Pulmonary nodule   . Rupture of right patellar tendon 12/31/2015   Patient fell the second evening after total knee replacement and suffer and open patella tendon rupture of her right knee.  Surgically repaired after an extensive irrigation and debridement on 12/30/2015  . 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 Urinary frequency   . Urinary urgency     Patient Active Problem List   Diagnosis Date Noted  . Rupture of right patellar tendon 12/31/2015  . Fall during current hospitalization 12/30/2015 12/30/2015  . 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 Diverticulitis of colon 12/18/2015  . Primary localized osteoarthritis of right knee 12/17/2015  . Contact dermatitis due to chemicals   . DJD (degenerative joint disease) of knee 01/05/2015  . Primary localized osteoarthritis of left knee   . Osteoarthritis   . Schizoaffective disorder (HCC)   . Diabetes mellitus without complication (HCC)   . Hypertension   . HLD (hyperlipidemia)   .  Asthma   . Pulmonary nodule   . Glaucoma   . Insomnia   . Well woman exam with routine gynecological exam 08/04/2014    Past Surgical History:  Procedure Laterality Date  . GALLBLADDER SURGERY  1997  . IRRIGATION AND DEBRIDEMENT KNEE Right 12/30/2015   Procedure: IRRIGATION AND DEBRIDEMENT KNEE;  Surgeon: Salvatore Marvel, MD;  Location: [facility] OR;  Service: Orthopedics;  Laterality: Right;  . PATELLAR TENDON REPAIR Right 12/30/2015   Procedure: PATELLA TENDON REPAIR;  Surgeon: Salvatore Marvel, MD;  Location: [facility] OR;  Service: Orthopedics;  Laterality: Right;  . TOTAL KNEE ARTHROPLASTY Left 01/05/2015   Procedure: TOTAL KNEE ARTHROPLASTY;  Surgeon: Salvatore Marvel, MD;  Location:  [facility] OR;  Service: Orthopedics;  Laterality: Left;  . TOTAL KNEE ARTHROPLASTY Right 12/28/2015   Procedure: TOTAL KNEE ARTHROPLASTY;  Surgeon: Salvatore Marvel, MD;  Location: [facility] OR;  Service: Orthopedics;  Laterality: Right;    OB History    Gravida Para Term Preterm AB Living   3 2 2   1 2    SAB TAB Ectopic Multiple Live Births     1             Home Medications    Prior to Admission medications   Medication Sig Start Date End Date Taking? Authorizing Provider  aspirin 81 MG chewable tablet Chew by mouth daily.   Yes Historical Provider, MD  losartan-hydrochlorothiazide (HYZAAR) 100-12.5 MG tablet Take 0.5 tablets by mouth daily.  05/22/15  Yes Historical Provider, MD  metFORMIN (GLUCOPHAGE) 500 MG tablet Take 500 mg by mouth 2 (two) times daily with a meal.   Yes Historical Provider, MD  pravastatin (PRAVACHOL) 80 MG tablet Take 80 mg by mouth daily.   Yes Historical Provider, MD  zolpidem (AMBIEN) 5 MG tablet Take 1 tablet (5 mg total) by mouth at bedtime. 01/04/16  Yes Sharon Seller, NP  acetaminophen (TYLENOL) 500 MG tablet Take 500 mg by mouth at bedtime.    Historical Provider, MD  albuterol (PROVENTIL HFA;VENTOLIN HFA) 108 (90 BASE) MCG/ACT inhaler Inhale 2 puffs into the lungs every 6 (six) hours as needed for wheezing or shortness of breath.     Historical Provider, MD  apixaban (ELIQUIS) 2.5 MG TABS tablet 1 tab po BID to prevent blood clots 01/01/16   Kirstin Shepperson, PA-C  atorvastatin (LIPITOR) 10 MG tablet Take 10 mg by mouth at bedtime.    Historical Provider, MD  budesonide-formoterol (SYMBICORT) 80-4.5 MCG/ACT inhaler Inhale 2 puffs into the lungs 2 (two) times daily.    Historical Provider, MD  cetirizine (ZYRTEC) 10 MG tablet Take 10 mg by mouth daily.    Historical Provider, MD  docusate sodium (COLACE) 100 MG capsule Take 1 capsule (100 mg total) by mouth every 12 (twelve) hours. Patient taking differently: Take 100 mg by mouth daily.  12/07/15   Elpidio Anis, PA-C    latanoprost (XALATAN) 0.005 % ophthalmic solution Place 1 drop into both eyes at bedtime.  07/31/14   Historical Provider, MD  linaclotide Karlene Einstein) 145 MCG CAPS capsule Take 1 capsule (145 mcg total) by mouth daily before breakfast. 11/29/15   Roma Kayser Schorr, NP  LORazepam (ATIVAN) 0.5 MG tablet Take 0.5 mg by mouth every 6 (six) hours as needed for anxiety.    Historical Provider, MD  metroNIDAZOLE (FLAGYL) 500 MG tablet Take 1 tablet (500 mg total) by mouth 2 (two) times daily. 04/11/16   Elvina Sidle, MD  oxycodone (OXY-IR) 5 MG capsule Take 1 capsule (  5 mg total) by mouth every 4 (four) hours as needed for pain (Take 1-2 tablets (5-10 mg) for moderate to severe pain). 01/15/16   Kirt Boys, DO  polyethylene glycol (MIRALAX / GLYCOLAX) packet 17grams in 6 oz of water twice a day until bowel movement.  LAXITIVE.  Restart if two days since last bowel movement Patient taking differently: daily as needed. 17grams in 6 oz of water twice a day until bowel movement.  LAXITIVE.  Restart if two days since last bowel movement 01/01/16   Kirstin Shepperson, PA-C  predniSONE (DELTASONE) 10 MG tablet One daily with food 04/11/16   Elvina Sidle, MD    Family History Family History  Problem Relation Age of Onset  . Heart attack Father   . Glaucoma Mother   . Diabetes Mellitus II Mother   . Hypertension Sister   . Cancer Maternal Grandmother   . Colon cancer Maternal Grandmother     Social History Social History  Substance Use Topics  . Smoking status: Former Games developer  . Smokeless tobacco: Never Used     Comment: quit smoking 43yrs ago  . Alcohol use No     Allergies   Bactrim [sulfamethoxazole-trimethoprim]; Ivp dye [iodinated diagnostic agents]; Clonazepam; Sulfa antibiotics; Codeine; Other; and Vicodin [hydrocodone-acetaminophen]   Review of Systems Review of Systems  Constitutional: Negative.   HENT: Negative.   Eyes: Positive for redness.  Respiratory: Negative.    Cardiovascular: Negative.   Gastrointestinal: Positive for abdominal pain and vomiting.  Genitourinary: Negative.   Musculoskeletal: Positive for back pain.  Neurological: Negative.   Psychiatric/Behavioral: Negative.      Physical Exam Triage Vital Signs ED Triage Vitals  Enc Vitals Group     BP      Pulse      Resp      Temp      Temp src      SpO2      Weight      Height      Head Circumference      Peak Flow      Pain Score      Pain Loc      Pain Edu?      Excl. in GC?    No data found.   Updated Vital Signs BP 110/71 (BP Location: Right Arm)   Pulse 77   Temp 97.8 F (36.6 C)   Resp 16   SpO2 98%   Physical Exam  Constitutional: She is oriented to person, place, and time. She appears well-developed and well-nourished.  HENT:  Head: Normocephalic.  Right Ear: External ear normal.  Left Ear: External ear normal.  Mouth/Throat: Oropharynx is clear and moist.  Eyes: EOM are normal. Pupils are equal, round, and reactive to light.  Mild bilateral conjunctival injection  Neck: Normal range of motion. Neck supple.  Cardiovascular: Normal rate, regular rhythm and normal heart sounds.   Pulmonary/Chest: Effort normal and breath sounds normal.  Abdominal: Soft. Bowel sounds are normal. She exhibits no mass. There is no tenderness. There is no rebound and no guarding.  Musculoskeletal: Normal range of motion.  Neurological: She is alert and oriented to person, place, and time.  Skin: Skin is warm and dry.  Nursing note and vitals reviewed.    UC Treatments / Results  Labs (all labs ordered are listed, but only abnormal results are displayed) Labs Reviewed  POCT I-STAT, CHEM 8 - Abnormal; Notable for the following:       Result Value  BUN 27 (*)    Creatinine, Ser 1.10 (*)    Glucose, Bld 103 (*)    Hemoglobin 15.3 (*)    All other components within normal limits  CBC    EKG  EKG Interpretation None       Radiology No results  found.  Procedures Procedures (including critical care time)  Medications Ordered in UC Medications - No data to display   Initial Impression / Assessment and Plan / UC Course  I have reviewed the triage vital signs and the nursing notes.  Pertinent labs & imaging results that were available during my care of the patient were reviewed by me and considered in my medical decision making (see chart for details).  Clinical Course    Final Clinical Impressions(s) / UC Diagnoses   Final diagnoses:  Strain of abdominal muscle, initial encounter  Acute bilateral thoracic back pain    New Prescriptions New Prescriptions   METRONIDAZOLE (FLAGYL) 500 MG TABLET    Take 1 tablet (500 mg total) by mouth 2 (two) times daily.   PREDNISONE (DELTASONE) 10 MG TABLET    One daily with food     Elvina SidleKurt Shakara Tweedy, MD 04/11/16 1147

## 2016-04-21 ENCOUNTER — Other Ambulatory Visit: Payer: Self-pay | Admitting: Internal Medicine

## 2016-04-21 DIAGNOSIS — E2839 Other primary ovarian failure: Secondary | ICD-10-CM

## 2016-05-02 ENCOUNTER — Ambulatory Visit: Payer: Medicare HMO | Admitting: Internal Medicine

## 2016-05-03 ENCOUNTER — Ambulatory Visit: Payer: Medicare HMO | Admitting: Internal Medicine

## 2016-06-01 ENCOUNTER — Other Ambulatory Visit: Payer: Self-pay | Admitting: Internal Medicine

## 2016-06-01 DIAGNOSIS — Z1231 Encounter for screening mammogram for malignant neoplasm of breast: Secondary | ICD-10-CM

## 2016-06-24 ENCOUNTER — Other Ambulatory Visit: Payer: Self-pay | Admitting: Internal Medicine

## 2016-06-24 DIAGNOSIS — N63 Unspecified lump in unspecified breast: Secondary | ICD-10-CM

## 2016-06-24 DIAGNOSIS — N644 Mastodynia: Secondary | ICD-10-CM

## 2016-07-01 ENCOUNTER — Ambulatory Visit
Admission: RE | Admit: 2016-07-01 | Discharge: 2016-07-01 | Disposition: A | Discharge status: Home / Self Care | Payer: Medicare HMO | Source: Ambulatory Visit | Attending: Internal Medicine | Admitting: Internal Medicine

## 2016-07-01 ENCOUNTER — Other Ambulatory Visit: Payer: Medicare HMO

## 2016-07-01 DIAGNOSIS — N644 Mastodynia: Secondary | ICD-10-CM

## 2016-07-01 DIAGNOSIS — N63 Unspecified lump in unspecified breast: Secondary | ICD-10-CM

## 2016-07-19 ENCOUNTER — Ambulatory Visit (HOSPITAL_COMMUNITY)
Admission: EM | Admit: 2016-07-19 | Discharge: 2016-07-19 | Disposition: A | Discharge status: Home / Self Care | Payer: Medicare HMO | Attending: Family Medicine | Admitting: Family Medicine

## 2016-07-19 ENCOUNTER — Encounter (HOSPITAL_COMMUNITY): Payer: Self-pay | Admitting: Family Medicine

## 2016-07-19 DIAGNOSIS — J40 Bronchitis, not specified as acute or chronic: Secondary | ICD-10-CM

## 2016-07-19 MED ORDER — AZITHROMYCIN 250 MG PO TABS: 250.0000 mg | ORAL_TABLET | Freq: Every day | ORAL | 0 refills | Status: DC

## 2016-07-19 NOTE — ED Provider Notes (Signed)
[facility]    CSN: [id number] Arrival date & time: 07/19/16  1010     History   Chief Complaint Chief Complaint  Patient presents with  . flu like sx    HPI Brooke Davidson is a 71 y.o. female.   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 She's been sick for about for 5 days. He's had no significant fever, rhinorrhea, or GI symptoms. She has had a mild sore throat however.  Asian states that she gets this kind of cough every year or 2. She has an inhaler which she's been using which gives her some relief. The cough is worse at night. She's not bringing up much in the way of phlegm. She's not short of breath.      Past Medical History:  Diagnosis Date  . Anemia    in the past  . Asthma    uses Symbicort daily and ALbuterol as needed  . Chronic back pain   . Complication of anesthesia    slow to wake up  . Constipation   . Contact dermatitis due to chemicals   . Cough    nonproductive and without fever  . Depression    but doesn't take any meds   . Diabetes mellitus without complication (HCC)    takes Metformin daily  . Eczema   . Fall during current hospitalization 12/30/2015 12/30/2015  . GERD (gastroesophageal reflux disease)    doesn't take any meds  . Glaucoma    uses eye drops daily  . Heart murmur    states it was told to her as a younger woman, no problems that she knows   . History of bronchitis a yr ago  . HLD (hyperlipidemia)    takes Pravastatin daily  . Hypertension    takes Losartan-HCTZ daily  . Insomnia    takes Ambien nightly   . Joint pain   . Joint swelling   . Muscle spasm    takes Baclofen daily as needed  . Osteoarthritis    knees  . Primary localized osteoarthritis of left knee   . Primary localized osteoarthritis of right knee 12/17/2015  . Pulmonary nodule   . Rupture of right patellar tendon 12/31/2015   Patient fell the second  evening after total knee replacement and suffer and open patella tendon rupture of her right knee.  Surgically repaired after an extensive irrigation and debridement on 12/30/2015  . 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 Urinary frequency   . Urinary urgency     Patient Active Problem List   Diagnosis Date Noted  . Rupture of right patellar tendon 12/31/2015  . Fall during current hospitalization 12/30/2015 12/30/2015  . 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 Diverticulitis of colon 12/18/2015  . Primary localized osteoarthritis of right knee 12/17/2015  . Contact dermatitis due to chemicals   . DJD (degenerative joint disease) of knee 01/05/2015  . Primary localized osteoarthritis of left knee   . Osteoarthritis   . Schizoaffective disorder (HCC)   . Diabetes mellitus without complication (HCC)   . Hypertension   . HLD (hyperlipidemia)   . Asthma   . Pulmonary nodule   . Glaucoma   . Insomnia   . Well woman exam with routine gynecological exam 08/04/2014    Past Surgical History:  Procedure Laterality Date  . GALLBLADDER SURGERY  1997  . IRRIGATION  AND DEBRIDEMENT KNEE Right 12/30/2015   Procedure: IRRIGATION AND DEBRIDEMENT KNEE;  Surgeon: Salvatore Marvelobert Wainer, MD;  [facility];  Service: Orthopedics;  Laterality: Right;  . PATELLAR TENDON REPAIR Right 12/30/2015   Procedure: PATELLA TENDON REPAIR;  Surgeon: Salvatore Marvelobert Wainer, MD;  [facility];  Service: Orthopedics;  Laterality: Right;  . TOTAL KNEE ARTHROPLASTY Left 01/05/2015   Procedure: TOTAL KNEE ARTHROPLASTY;  Surgeon: Salvatore Marvelobert Wainer, MD;  [facility];  Service: Orthopedics;  Laterality: Left;  . TOTAL KNEE ARTHROPLASTY Right 12/28/2015   Procedure: TOTAL KNEE ARTHROPLASTY;  Surgeon: Salvatore Marvelobert Wainer, MD;  [facility];  Service: Orthopedics;  Laterality: Right;    OB History    Gravida Para Term Preterm AB Living   3 2 2   1 2    SAB TAB Ectopic Multiple Live Births     1              Home Medications    Prior to Admission medications   Medication Sig Start Date End Date Taking? Authorizing Provider  acetaminophen (TYLENOL) 500 MG tablet Take 500 mg by mouth at bedtime.   Yes Historical Provider, MD  albuterol (PROVENTIL HFA;VENTOLIN HFA) 108 (90 BASE) MCG/ACT inhaler Inhale 2 puffs into the lungs every 6 (six) hours as needed for wheezing or shortness of breath.    Yes Historical Provider, MD  apixaban (ELIQUIS) 2.5 MG TABS tablet 1 tab po BID to prevent blood clots 01/01/16  Yes Kirstin Shepperson, PA-C  aspirin 81 MG chewable tablet Chew by mouth daily.   Yes Historical Provider, MD  atorvastatin (LIPITOR) 10 MG tablet Take 10 mg by mouth at bedtime.   Yes Historical Provider, MD  budesonide-formoterol (SYMBICORT) 80-4.5 MCG/ACT inhaler Inhale 2 puffs into the lungs 2 (two) times daily.   Yes Historical Provider, MD  cetirizine (ZYRTEC) 10 MG tablet Take 10 mg by mouth daily.   Yes Historical Provider, MD  latanoprost (XALATAN) 0.005 % ophthalmic solution Place 1 drop into both eyes at bedtime.  07/31/14  Yes Historical Provider, MD  linaclotide Karlene Einstein(LINZESS) 145 MCG CAPS capsule Take 1 capsule (145 mcg total) by mouth daily before breakfast. 11/29/15  Yes Roma KayserKatherine P Schorr, NP  LORazepam (ATIVAN) 0.5 MG tablet Take 0.5 mg by mouth every 6 (six) hours as needed for anxiety.   Yes Historical Provider, MD  losartan-hydrochlorothiazide (HYZAAR) 100-12.5 MG tablet Take 0.5 tablets by mouth daily.  05/22/15  Yes Historical Provider, MD  metFORMIN (GLUCOPHAGE) 500 MG tablet Take 500 mg by mouth 2 (two) times daily with a meal.   Yes Historical Provider, MD  metroNIDAZOLE (FLAGYL) 500 MG tablet Take 1 tablet (500 mg total) by mouth 2 (two) times daily. 04/11/16  Yes Elvina SidleKurt Mareli Antunes, MD  oxycodone (OXY-IR) 5 MG capsule Take 1 capsule (5 mg total) by mouth every 4 (four) hours as needed for pain (Take 1-2 tablets (5-10 mg) for moderate to severe pain). 01/15/16  Yes Kirt BoysMonica Carter, DO   polyethylene glycol (MIRALAX / GLYCOLAX) packet 17grams in 6 oz of water twice a day until bowel movement.  LAXITIVE.  Restart if two days since last bowel movement Patient taking differently: daily as needed. 17grams in 6 oz of water twice a day until bowel movement.  LAXITIVE.  Restart if two days since last bowel movement 01/01/16  Yes Kirstin Shepperson, PA-C  pravastatin (PRAVACHOL) 80 MG tablet Take 80 mg by mouth daily.   Yes Historical Provider, MD  predniSONE (DELTASONE) 10 MG tablet One daily with  food 04/11/16  Yes Elvina Sidle, MD  zolpidem (AMBIEN) 5 MG tablet Take 1 tablet (5 mg total) by mouth at bedtime. 01/04/16  Yes Sharon Seller, NP  azithromycin (ZITHROMAX) 250 MG tablet Take 1 tablet (250 mg total) by mouth daily. Take first 2 tablets together, then 1 every day until finished. 07/19/16   Elvina Sidle, MD    Family History Family History  Problem Relation Age of Onset  . Heart attack Father   . Glaucoma Mother   . Diabetes Mellitus II Mother   . Hypertension Sister   . Cancer Maternal Grandmother   . Colon cancer Maternal Grandmother     Social History Social History  Substance Use Topics  . Smoking status: Former Games developer  . Smokeless tobacco: Never Used     Comment: quit smoking 36yrs ago  . Alcohol use No     Allergies   Bactrim [sulfamethoxazole-trimethoprim]; Ivp dye [iodinated diagnostic agents]; Clonazepam; Sulfa antibiotics; Codeine; Other; and Vicodin [hydrocodone-acetaminophen]   Review of Systems Review of Systems  Constitutional: Positive for fatigue.  HENT: Positive for sore throat.   Respiratory: Positive for cough. Negative for shortness of breath.   Cardiovascular: Negative.   Gastrointestinal: Negative.   Genitourinary: Negative.   Musculoskeletal: Positive for myalgias.  Neurological: Positive for headaches.     Physical Exam Triage Vital Signs ED Triage Vitals [07/19/16 1052]  Enc Vitals Group     BP 132/78     Pulse Rate  66     Resp 16     Temp 98.8 F (37.1 C)     Temp Source Oral     SpO2 95 %     Weight      Height      Head Circumference      Peak Flow      Pain Score      Pain Loc      Pain Edu?      Excl. in GC?    No data found.   Updated Vital Signs BP 132/78 (BP Location: Left Arm)   Pulse 66   Temp 98.8 F (37.1 C) (Oral)   Resp 16   SpO2 95%    Physical Exam  Constitutional: She is oriented to person, place, and time. She appears well-developed and well-nourished.  HENT:  Head: Normocephalic.  Right Ear: External ear normal.  Left Ear: External ear normal.  Mouth/Throat: Oropharynx is clear and moist.  Eyes: Conjunctivae and EOM are normal. Pupils are equal, round, and reactive to light.  Neck: Normal range of motion. Neck supple.  Cardiovascular: Normal rate, regular rhythm and normal heart sounds.   Pulmonary/Chest: Effort normal and breath sounds normal. No respiratory distress.  Abdominal: Soft.  Musculoskeletal: Normal range of motion.  Neurological: She is alert and oriented to person, place, and time.  Skin: Skin is warm and dry.  Psychiatric: Her behavior is normal.  Nursing note and vitals reviewed.    UC Treatments / Results  Labs (all labs ordered are listed, but only abnormal results are displayed) Labs Reviewed - No data to display  EKG  EKG Interpretation None       Radiology No results found.  Procedures Procedures (including critical care time)  Medications Ordered in UC Medications - No data to display   Initial Impression / Assessment and Plan / UC Course  I have reviewed the triage vital signs and the nursing notes.  Pertinent labs & imaging results that were available during my care  of the patient were reviewed by me and considered in my medical decision making (see chart for details).     Final Clinical Impressions(s) / UC Diagnoses   Final diagnoses:  Bronchitis    New Prescriptions New Prescriptions   AZITHROMYCIN  (ZITHROMAX) 250 MG TABLET    Take 1 tablet (250 mg total) by mouth daily. Take first 2 tablets together, then 1 every day until finished.     Elvina Sidle, MD 07/19/16 [phone]

## 2016-07-19 NOTE — ED Triage Notes (Signed)
C/o cough, sore throat, sob, headache States she usually gets antibiotic otc meds used as tx

## 2018-01-26 ENCOUNTER — Ambulatory Visit: Payer: Medicare HMO | Admitting: Podiatry

## 2018-03-16 ENCOUNTER — Ambulatory Visit: Payer: Medicare HMO | Admitting: Podiatry

## 2018-03-26 ENCOUNTER — Other Ambulatory Visit: Payer: Self-pay

## 2018-03-26 ENCOUNTER — Encounter

## 2018-03-26 ENCOUNTER — Ambulatory Visit: Payer: 59 | Admitting: Podiatry

## 2018-03-26 ENCOUNTER — Encounter: Payer: Self-pay | Admitting: Podiatry

## 2018-03-26 VITALS — BP 152/69 | HR 51

## 2018-03-26 DIAGNOSIS — M7751 Other enthesopathy of right foot: Secondary | ICD-10-CM | POA: Diagnosis not present

## 2018-03-26 DIAGNOSIS — M79674 Pain in right toe(s): Secondary | ICD-10-CM

## 2018-03-26 DIAGNOSIS — M205X2 Other deformities of toe(s) (acquired), left foot: Secondary | ICD-10-CM | POA: Diagnosis not present

## 2018-03-26 DIAGNOSIS — M205X1 Other deformities of toe(s) (acquired), right foot: Secondary | ICD-10-CM

## 2018-03-26 DIAGNOSIS — Q828 Other specified congenital malformations of skin: Secondary | ICD-10-CM

## 2018-03-26 DIAGNOSIS — M79675 Pain in left toe(s): Secondary | ICD-10-CM

## 2018-03-26 NOTE — Progress Notes (Signed)
Subjective:  Patient ID: Brooke Davidson, female    DOB: [birth year],  MRN: [id number]  Chief Complaint  Patient presents with  . Callouses    B/ l great toes, bottom of right 5th toe trim    71 y.o. female presents with the above complaint. Reports painful lesion on the side of the right toe about the 5th toe and the inside of both great toes.   Review of Systems: Negative except as noted in the HPI. Denies N/V/F/Ch.  Past Medical History:  Diagnosis Date  . Anemia    in the past  . Asthma    uses Symbicort daily and ALbuterol as needed  . Chronic back pain   . Complication of anesthesia    slow to wake up  . Constipation   . Contact dermatitis due to chemicals   . Cough    nonproductive and without fever  . Depression    but doesn't take any meds   . Diabetes mellitus without complication (HCC)    takes Metformin daily  . Eczema   . Fall during current hospitalization 12/30/2015 12/30/2015  . GERD (gastroesophageal reflux disease)    doesn't take any meds  . Glaucoma    uses eye drops daily  . Heart murmur    states it was told to her as a younger woman, no problems that she knows   . History of bronchitis a yr ago  . HLD (hyperlipidemia)    takes Pravastatin daily  . Hypertension    takes Losartan-HCTZ daily  . Insomnia    takes Ambien nightly   . Joint pain   . Joint swelling   . Muscle spasm    takes Baclofen daily as needed  . Osteoarthritis    knees  . Primary localized osteoarthritis of left knee   . Primary localized osteoarthritis of right knee 12/17/2015  . Pulmonary nodule   . Rupture of right patellar tendon 12/31/2015   Patient fell the second evening after total knee replacement and suffer and open patella tendon rupture of her right knee.  Surgically repaired after an extensive irrigation and debridement on 12/30/2015  . 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 Urinary frequency   . Urinary urgency     Current Outpatient  Medications:  .  albuterol (PROVENTIL HFA;VENTOLIN HFA) 108 (90 BASE) MCG/ACT inhaler, Inhale 2 puffs into the lungs every 6 (six) hours as needed for wheezing or shortness of breath. , Disp: , Rfl:  .  apixaban (ELIQUIS) 2.5 MG TABS tablet, 1 tab po BID to prevent blood clots, Disp: 60 tablet, Rfl: 1 .  aspirin 81 MG chewable tablet, Chew by mouth daily., Disp: , Rfl:  .  atorvastatin (LIPITOR) 10 MG tablet, Take 10 mg by mouth at bedtime., Disp: , Rfl:  .  budesonide-formoterol (SYMBICORT) 80-4.5 MCG/ACT inhaler, Inhale 2 puffs into the lungs 2 (two) times daily., Disp: , Rfl:  .  cetirizine (ZYRTEC) 10 MG tablet, Take 10 mg by mouth daily., Disp: , Rfl:  .  diclofenac sodium (VOLTAREN) 1 % GEL, Apply topically 4 (four) times daily., Disp: , Rfl:  .  losartan-hydrochlorothiazide (HYZAAR) 100-12.5 MG tablet, Take 0.5 tablets by mouth daily. , Disp: , Rfl: 2 .  metFORMIN (GLUCOPHAGE) 500 MG tablet, Take 500 mg by mouth 2 (two) times daily with a meal., Disp: , Rfl:  .  polyethylene glycol (MIRALAX / GLYCOLAX) packet, 17grams in 6 oz of water twice a day until bowel movement.  LAXITIVE.  Restart if two days since last bowel movement (Patient taking differently: daily as needed. 17grams in 6 oz of water twice a day until bowel movement.  LAXITIVE.  Restart if two days since last bowel movement), Disp: 14 each, Rfl: 0 .  pravastatin (PRAVACHOL) 80 MG tablet, Take 80 mg by mouth daily., Disp: , Rfl:  .  acetaminophen (TYLENOL) 500 MG tablet, Take 500 mg by mouth at bedtime., Disp: , Rfl:  .  azithromycin (ZITHROMAX) 250 MG tablet, Take 1 tablet (250 mg total) by mouth daily. Take first 2 tablets together, then 1 every day until finished. (Patient not taking: Reported on 03/26/2018), Disp: 6 tablet, Rfl: 0 .  latanoprost (XALATAN) 0.005 % ophthalmic solution, Place 1 drop into both eyes at bedtime. , Disp: , Rfl: 12 .  linaclotide (LINZESS) 145 MCG CAPS capsule, Take 1 capsule (145 mcg total) by mouth  daily before breakfast. (Patient not taking: Reported on 03/26/2018), Disp: 30 capsule, Rfl: 0 .  LORazepam (ATIVAN) 0.5 MG tablet, Take 0.5 mg by mouth every 6 (six) hours as needed for anxiety., Disp: , Rfl:  .  metroNIDAZOLE (FLAGYL) 500 MG tablet, Take 1 tablet (500 mg total) by mouth 2 (two) times daily. (Patient not taking: Reported on 03/26/2018), Disp: 10 tablet, Rfl: 0 .  oxycodone (OXY-IR) 5 MG capsule, Take 1 capsule (5 mg total) by mouth every 4 (four) hours as needed for pain (Take 1-2 tablets (5-10 mg) for moderate to severe pain). (Patient not taking: Reported on 03/26/2018), Disp: 60 capsule, Rfl: 0 .  predniSONE (DELTASONE) 10 MG tablet, One daily with food (Patient not taking: Reported on 03/26/2018), Disp: 5 tablet, Rfl: 0 .  triamcinolone ointment (KENALOG) 0.1 %, , Disp: , Rfl:  .  zolpidem (AMBIEN) 5 MG tablet, Take 1 tablet (5 mg total) by mouth at bedtime. (Patient not taking: Reported on 03/26/2018), Disp: 30 tablet, Rfl: 5  Social History   Tobacco Use  Smoking Status Former Smoker  Smokeless Tobacco Never Used  Tobacco Comment   quit smoking 50yrs ago    Allergies  Allergen Reactions  . Bactrim [Sulfamethoxazole-Trimethoprim] Swelling    Tongue swells  . Ivp Dye [Iodinated Diagnostic Agents] Hives  . Clonazepam     dizziness  . Sulfa Antibiotics Swelling  . Codeine Itching  . Other Itching and Rash    Perfume  . Vicodin [Hydrocodone-Acetaminophen] Itching   Objective:   Vitals:   03/26/18 1425  BP: (!) 152/69  Pulse: (!) 51   There is no height or weight on file to calculate BMI. Constitutional Well developed. Well nourished.  Vascular Dorsalis pedis pulses palpable bilaterally. Posterior tibial pulses palpable bilaterally. Capillary refill normal to all digits.  No cyanosis or clubbing noted. Pedal hair growth normal.  Neurologic Normal speech. Oriented to person, place, and time. Epicritic sensation to light touch grossly present  bilaterally.  Dermatologic Nails well groomed and normal in appearance. No open wounds. HPK R 5th MPJ, Hallux IPJ bilat  Orthopedic: Normal joint ROM without pain or crepitus bilaterally. No visible deformities. POP R 5th MPJ   Radiographs: None Assessment:   1. Capsulitis of metatarsophalangeal (MTP) joint of right foot   2. Hallux limitus of left foot   3. Hallux limitus, right   4. Pain in toes of both feet   5. Porokeratosis    Plan:  Patient was evaluated and treated and all questions answered.  Capsulitis 5th MPJ -Educated on etiology -Injection as below  Procedure:  Joint Injection Location: Right 5th MPJ joint Skin Prep: Alcohol. Injectate: 0.5 cc 1% lidocaine plain, 0.5 cc dexamethasone phosphate. Disposition: Patient tolerated procedure well. Injection site dressed with a band-aid.   Porokeratoses -Debrided x3  Procedure: Paring of Lesion Rationale: painful hyperkeratotic lesion Type of Debridement: manual, sharp debridement. Instrumentation: 312 blade Number of Lesions: 3   Return if symptoms worsen or fail to improve.

## 2018-03-26 NOTE — Patient Instructions (Signed)
[employer] sneakers

## 2018-05-05 ENCOUNTER — Ambulatory Visit (HOSPITAL_COMMUNITY)
Admission: EM | Admit: 2018-05-05 | Discharge: 2018-05-05 | Disposition: A | Discharge status: Home / Self Care | Payer: Medicare HMO | Attending: Family Medicine | Admitting: Family Medicine

## 2018-05-05 ENCOUNTER — Encounter (HOSPITAL_COMMUNITY): Payer: Self-pay | Admitting: Emergency Medicine

## 2018-05-05 ENCOUNTER — Other Ambulatory Visit: Payer: Self-pay

## 2018-05-05 DIAGNOSIS — J42 Unspecified chronic bronchitis: Secondary | ICD-10-CM

## 2018-05-05 MED ORDER — CETIRIZINE HCL 10 MG PO TABS: 10.0000 mg | ORAL_TABLET | Freq: Every day | ORAL | 0 refills | Status: AC

## 2018-05-05 MED ORDER — PREDNISONE 20 MG PO TABS: ORAL_TABLET | ORAL | 0 refills | Status: DC

## 2018-05-05 MED ORDER — BUDESONIDE-FORMOTEROL FUMARATE 80-4.5 MCG/ACT IN AERO: 2.0000 | INHALATION_SPRAY | Freq: Two times a day (BID) | RESPIRATORY_TRACT | 0 refills | Status: DC

## 2018-05-05 MED ORDER — ALBUTEROL SULFATE HFA 108 (90 BASE) MCG/ACT IN AERS: 2.0000 | INHALATION_SPRAY | RESPIRATORY_TRACT | Status: DC | PRN

## 2018-05-05 MED ORDER — ALBUTEROL SULFATE HFA 108 (90 BASE) MCG/ACT IN AERS: 2.0000 | INHALATION_SPRAY | Freq: Four times a day (QID) | RESPIRATORY_TRACT | 0 refills | Status: AC | PRN

## 2018-05-05 NOTE — ED Provider Notes (Signed)
[facility]    CSN: [id number] Arrival date & time: 05/05/18  1611     History   Chief Complaint Chief Complaint  Patient presents with  . Shortness of Breath    HPI Brooke Davidson is a 72 y.o. female.   HPI  Patient suffers from chronic bronchitis.  Current exacerbation has been ongoing for over a month.  She reports worsening of shortness of breath and nonproductive cough.  She also complains of associated nasal congestion and throat soreness at times.  Cough and chest tightness occur mostly at night. She has ran out of her albuterol inhaler although reports she is consistently used her Symbicort twice daily as prescribed.  She is a non-smoker denies any exposure to secondhand smoke.  She denies any known history of CHF, chest pain, lower extremity edema.  She has a regular PCP however has not been seen within the last 6 months.  Past Medical History:  Diagnosis Date  . Anemia    in the past  . Asthma    uses Symbicort daily and ALbuterol as needed  . Chronic back pain   . Complication of anesthesia    slow to wake up  . Constipation   . Contact dermatitis due to chemicals   . Cough    nonproductive and without fever  . Depression    but doesn't take any meds   . Diabetes mellitus without complication (HCC)    takes Metformin daily  . Eczema   . Fall during current hospitalization 12/30/2015 12/30/2015  . GERD (gastroesophageal reflux disease)    doesn't take any meds  . Glaucoma    uses eye drops daily  . Heart murmur    states it was told to her as a younger woman, no problems that she knows   . History of bronchitis a yr ago  . HLD (hyperlipidemia)    takes Pravastatin daily  . Hypertension    takes Losartan-HCTZ daily  . Insomnia    takes Ambien nightly   . Joint pain   . Joint swelling   . Muscle spasm    takes Baclofen daily as needed  . Osteoarthritis    knees  . Primary localized osteoarthritis of left knee   . Primary localized  osteoarthritis of right knee 12/17/2015  . Pulmonary nodule   . Rupture of right patellar tendon 12/31/2015   Patient fell the second evening after total knee replacement and suffer and open patella tendon rupture of her right knee.  Surgically repaired after an extensive irrigation and debridement on 12/30/2015  . 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 Urinary frequency   . Urinary urgency     Patient Active Problem List   Diagnosis Date Noted  . Rupture of right patellar tendon 12/31/2015  . Fall during current hospitalization 12/30/2015 12/30/2015  . 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 Diverticulitis of colon 12/18/2015  . Primary localized osteoarthritis of right knee 12/17/2015  . Contact dermatitis due to chemicals   . DJD (degenerative joint disease) of knee 01/05/2015  . Primary localized osteoarthritis of left knee   . Osteoarthritis   . Schizoaffective disorder (HCC)   . Diabetes mellitus without complication (HCC)   . Hypertension   . HLD (hyperlipidemia)   . Asthma   . Pulmonary nodule   . Glaucoma   . Insomnia   . Well woman exam with routine gynecological exam 08/04/2014    Past Surgical  History:  Procedure Laterality Date  . GALLBLADDER SURGERY  1997  . IRRIGATION AND DEBRIDEMENT KNEE Right 12/30/2015   Procedure: IRRIGATION AND DEBRIDEMENT KNEE;  Surgeon: Salvatore Marvel, MD;  Location: [facility] OR;  Service: Orthopedics;  Laterality: Right;  . PATELLAR TENDON REPAIR Right 12/30/2015   Procedure: PATELLA TENDON REPAIR;  Surgeon: Salvatore Marvel, MD;  Location: [facility] OR;  Service: Orthopedics;  Laterality: Right;  . TOTAL KNEE ARTHROPLASTY Left 01/05/2015   Procedure: TOTAL KNEE ARTHROPLASTY;  Surgeon: Salvatore Marvel, MD;  Location: [facility] OR;  Service: Orthopedics;  Laterality: Left;  . TOTAL KNEE ARTHROPLASTY Right 12/28/2015   Procedure: TOTAL KNEE ARTHROPLASTY;  Surgeon: Salvatore Marvel, MD;  Location: [facility] OR;  Service: Orthopedics;   Laterality: Right;    OB History    Gravida  3   Para  2   Term  2   Preterm      AB  1   Living  2     SAB      TAB  1   Ectopic      Multiple      Live Births               Home Medications    Prior to Admission medications   Medication Sig Start Date End Date Taking? Authorizing Provider  acetaminophen (TYLENOL) 500 MG tablet Take 500 mg by mouth at bedtime.    [provider]  albuterol (PROVENTIL HFA;VENTOLIN HFA) 108 (90 BASE) MCG/ACT inhaler Inhale 2 puffs into the lungs every 6 (six) hours as needed for wheezing or shortness of breath.     [provider]  apixaban (ELIQUIS) 2.5 MG TABS tablet 1 tab po BID to prevent blood clots 01/01/16   Shepperson, Kirstin, PA-C  aspirin 81 MG chewable tablet Chew by mouth daily.    [provider]  atorvastatin (LIPITOR) 10 MG tablet Take 10 mg by mouth at bedtime.    [provider]  azithromycin (ZITHROMAX) 250 MG tablet Take 1 tablet (250 mg total) by mouth daily. Take first 2 tablets together, then 1 every day until finished. Patient not taking: Reported on 03/26/2018 07/19/16   Elvina Sidle, MD  budesonide-formoterol [facility]) 80-4.5 MCG/ACT inhaler Inhale 2 puffs into the lungs 2 (two) times daily.    [provider]  cetirizine (ZYRTEC) 10 MG tablet Take 10 mg by mouth daily.    [provider]  diclofenac sodium (VOLTAREN) 1 % GEL Apply topically 4 (four) times daily.    [provider]  latanoprost (XALATAN) 0.005 % ophthalmic solution Place 1 drop into both eyes at bedtime.  07/31/14   [provider]  linaclotide Karlene Einstein) 145 MCG CAPS capsule Take 1 capsule (145 mcg total) by mouth daily before breakfast. Patient not taking: Reported on 03/26/2018 11/29/15   Schorr, Roma Kayser, NP  LORazepam (ATIVAN) 0.5 MG tablet Take 0.5 mg by mouth every 6 (six) hours as needed for anxiety.    [provider]  losartan-hydrochlorothiazide  (HYZAAR) 100-12.5 MG tablet Take 0.5 tablets by mouth daily.  05/22/15   [provider]  metFORMIN (GLUCOPHAGE) 500 MG tablet Take 500 mg by mouth 2 (two) times daily with a meal.    [provider]  metroNIDAZOLE (FLAGYL) 500 MG tablet Take 1 tablet (500 mg total) by mouth 2 (two) times daily. Patient not taking: Reported on 03/26/2018 04/11/16   Elvina Sidle, MD  oxycodone (OXY-IR) 5 MG capsule Take 1 capsule (5 mg total) by mouth every  4 (four) hours as needed for pain (Take 1-2 tablets (5-10 mg) for moderate to severe pain). Patient not taking: Reported on 03/26/2018 01/15/16   Kirt Boys, DO  polyethylene glycol [facility] / GLYCOLAX) packet 17grams in 6 oz of water twice a day until bowel movement.  LAXITIVE.  Restart if two days since last bowel movement Patient taking differently: daily as needed. 17grams in 6 oz of water twice a day until bowel movement.  LAXITIVE.  Restart if two days since last bowel movement 01/01/16   Shepperson, Kirstin, PA-C  pravastatin (PRAVACHOL) 80 MG tablet Take 80 mg by mouth daily.    [provider]  predniSONE (DELTASONE) 10 MG tablet One daily with food Patient not taking: Reported on 03/26/2018 04/11/16   Elvina Sidle, MD  triamcinolone ointment (KENALOG) 0.1 %  02/13/18   [provider]  zolpidem (AMBIEN) 5 MG tablet Take 1 tablet (5 mg total) by mouth at bedtime. Patient not taking: Reported on 03/26/2018 01/04/16   Sharon Seller, NP    Family History Family History  Problem Relation Age of Onset  . Heart attack Father   . Glaucoma Mother   . Diabetes Mellitus II Mother   . Hypertension Sister   . Cancer Maternal Grandmother   . Colon cancer Maternal Grandmother     Social History Social History   Tobacco Use  . Smoking status: Former Games developer  . Smokeless tobacco: Never Used  . Tobacco comment: quit smoking 32yrs ago  Substance Use Topics  . Alcohol use: No  . Drug use: No     Allergies     Bactrim [sulfamethoxazole-trimethoprim]; Ivp dye [iodinated diagnostic agents]; Clonazepam; Sulfa antibiotics; Codeine; Other; and Vicodin [hydrocodone-acetaminophen]   Review of Systems Review of Systems Pertinent negatives listed in HPI  Physical Exam Triage Vital Signs ED Triage Vitals  Enc Vitals Group     BP 05/05/18 1650 (!) 136/55     Pulse Rate 05/05/18 1650 69     Resp 05/05/18 1650 (!) 22     Temp 05/05/18 1650 97.8 F (36.6 C)     Temp Source 05/05/18 1650 Oral     SpO2 05/05/18 1650 99 %     Weight --      Height --      Head Circumference --      Peak Flow --      Pain Score 05/05/18 1646 3     Pain Loc --      Pain Edu? --      Excl. in GC? --    No data found.  Updated Vital Signs BP (!) 136/55 (BP Location: Left Arm)   Pulse 69   Temp 97.8 F (36.6 C) (Oral)   Resp (!) 22   SpO2 99%   Visual Acuity Right Eye Distance:   Left Eye Distance:   Bilateral Distance:    Right Eye Near:   Left Eye Near:    Bilateral Near:     Physical Exam   UC Treatments / Results  Labs (all labs ordered are listed, but only abnormal results are displayed) Labs Reviewed - No data to display  EKG None  Radiology No results found.  Procedures Procedures (including critical care time)  Medications Ordered in UC Medications - No data to display  Initial Impression / Assessment and Plan / UC Course  I have reviewed the triage vital signs and the nursing notes.  Pertinent labs & imaging results that were available during my  care of the patient were reviewed by me and considered in my medical decision making (see chart for details).   Patient presents with an acute chronic bronchitis exacerbation.  She is not in distress although is having some increased work of breathing.  Treatment was as follows: Prednisone taper 40 mg x 5 days, refilled Symbicort, Continue  Albuterol inhaler 2 puffs every 4-6 hours as needed. Prescribed antihistamine treatment with  cetirizine given intermittent rhinitis symptoms. Precautions and red flags discussed.  Patient verbalizes understanding. Follow-up with PCP recommended for further management. Final Clinical Impressions(s) / UC Diagnoses   Final diagnoses:  Chronic bronchitis, unspecified chronic bronchitis type [facility](HCC)     Discharge Instructions     Take all medication as prescribed.     ED Prescriptions    Medication Sig Dispense Auth. Provider   cetirizine (ZYRTEC) 10 MG tablet Take 1 tablet (10 mg total) by mouth daily. 90 tablet Bing NeighborsHarris, Letroy Vazguez S, FNP   predniSONE (DELTASONE) 20 MG tablet  (Status: Discontinued) Take 40 mg once daily with breakfast. 10 tablet Bing NeighborsHarris, Behr Cislo S, FNP   budesonide-formoterol (SYMBICORT) 80-4.5 MCG/ACT inhaler Inhale 2 puffs into the lungs 2 (two) times daily. 1 Inhaler Bing NeighborsHarris, Karthika Glasper S, FNP   albuterol (PROVENTIL HFA;VENTOLIN HFA) 108 (90 Base) MCG/ACT inhaler Inhale 2 puffs into the lungs every 6 (six) hours as needed for wheezing or shortness of breath. 8 g Bing NeighborsHarris, Nimrit Kehres S, FNP   predniSONE (DELTASONE) 20 MG tablet Take 40 mg once daily with breakfast. 10 tablet Bing NeighborsHarris, Cataleia Gade S, FNP     Controlled Substance Prescriptions [place] Controlled Substance Registry consulted? Not Applicable   Bing NeighborsHarris, Ariela Mochizuki S, FNP 05/09/18 [phone]

## 2018-05-05 NOTE — Discharge Instructions (Signed)
Take all medication as prescribed

## 2018-05-05 NOTE — ED Triage Notes (Signed)
Sob, nose itches, throat is sore, coughing, eyes itch.  Onset one month ago

## 2018-05-06 ENCOUNTER — Telehealth (HOSPITAL_COMMUNITY): Payer: Self-pay | Admitting: Physician Assistant

## 2018-05-06 ENCOUNTER — Encounter (HOSPITAL_COMMUNITY): Payer: Self-pay | Admitting: Physician Assistant

## 2018-05-06 MED ORDER — AZITHROMYCIN 250 MG PO TABS: 250.0000 mg | ORAL_TABLET | Freq: Every day | ORAL | 0 refills | Status: DC

## 2018-05-06 NOTE — Telephone Encounter (Deleted)
Patient returned due to missing antibiotic prescription

## 2018-05-06 NOTE — Telephone Encounter (Signed)
Patient returned due to missing antibiotic prescription.  States she has been having symptoms for the past month.  Has similar symptoms each year from bronchitis.  Uses albuterol and Symbicort for symptoms.  Has been having hot sweats, wheezing.  States she "always gets antibiotics" when this happens.  Lungs clear to auscultation bilaterally without adventitious lung sounds.  Given 1 month history of symptoms, will provide azithromycin as directed.  Return precautions given.  Patient expresses understanding and agrees to plan.

## 2018-09-22 ENCOUNTER — Other Ambulatory Visit: Payer: Self-pay | Admitting: Family Medicine

## 2018-10-11 ENCOUNTER — Other Ambulatory Visit: Payer: Self-pay

## 2018-10-15 ENCOUNTER — Other Ambulatory Visit: Payer: Self-pay

## 2018-10-15 ENCOUNTER — Emergency Department (HOSPITAL_COMMUNITY)
Admission: EM | Admit: 2018-10-15 | Discharge: 2018-10-16 | Disposition: A | Discharge status: Home / Self Care | Payer: Medicare HMO | Attending: Emergency Medicine | Admitting: Emergency Medicine

## 2018-10-15 DIAGNOSIS — Z87891 Personal history of nicotine dependence: Secondary | ICD-10-CM | POA: Insufficient documentation

## 2018-10-15 DIAGNOSIS — Z7982 Long term (current) use of aspirin: Secondary | ICD-10-CM | POA: Diagnosis not present

## 2018-10-15 DIAGNOSIS — R21 Rash and other nonspecific skin eruption: Secondary | ICD-10-CM | POA: Diagnosis present

## 2018-10-15 DIAGNOSIS — I10 Essential (primary) hypertension: Secondary | ICD-10-CM | POA: Diagnosis not present

## 2018-10-15 DIAGNOSIS — L309 Dermatitis, unspecified: Secondary | ICD-10-CM | POA: Insufficient documentation

## 2018-10-15 DIAGNOSIS — R339 Retention of urine, unspecified: Secondary | ICD-10-CM | POA: Insufficient documentation

## 2018-10-15 DIAGNOSIS — J45909 Unspecified asthma, uncomplicated: Secondary | ICD-10-CM | POA: Diagnosis not present

## 2018-10-15 DIAGNOSIS — Z96653 Presence of artificial knee joint, bilateral: Secondary | ICD-10-CM | POA: Diagnosis not present

## 2018-10-15 DIAGNOSIS — Z7901 Long term (current) use of anticoagulants: Secondary | ICD-10-CM | POA: Diagnosis not present

## 2018-10-15 DIAGNOSIS — E119 Type 2 diabetes mellitus without complications: Secondary | ICD-10-CM | POA: Insufficient documentation

## 2018-10-15 DIAGNOSIS — Z79899 Other long term (current) drug therapy: Secondary | ICD-10-CM | POA: Diagnosis not present

## 2018-10-15 DIAGNOSIS — Z7984 Long term (current) use of oral hypoglycemic drugs: Secondary | ICD-10-CM | POA: Diagnosis not present

## 2018-10-15 NOTE — ED Triage Notes (Signed)
Per pt she said her urine very dark and orange color. Pt said she is having trouble using the bathroom. No burning or hurting. Just feels very full. No abdominal pain but tightness

## 2018-10-16 LAB — URINALYSIS, ROUTINE W REFLEX MICROSCOPIC
Bilirubin Urine: NEGATIVE
Glucose, UA: NEGATIVE mg/dL
Hgb urine dipstick: NEGATIVE
Ketones, ur: NEGATIVE mg/dL
Nitrite: NEGATIVE
Protein, ur: NEGATIVE mg/dL
Specific Gravity, Urine: 1.011 (ref 1.005–1.030)
pH: 5 (ref 5.0–8.0)

## 2018-10-16 MED ORDER — HYDROCORTISONE 1 % EX CREA: TOPICAL_CREAM | CUTANEOUS | 0 refills | Status: DC

## 2018-10-16 NOTE — ED Provider Notes (Signed)
[facility] EMERGENCY DEPARTMENT Provider Note   CSN: [id number] Arrival date & time: 10/15/18  2337    History   Chief Complaint Chief Complaint  Patient presents with  . Urinary Retention    HPI Brooke Davidson is a 73 y.o. female.   The history is provided by the patient.  She has history of hypertension, diabetes, hyperlipidemia and comes in stating that she was having difficulty urinating.  She normally has urinary urgency, but tonight, when she got to the bathroom she had to push hard to get the urine out.  The urine was brown, so she came to the ED.  When she urinated here, the urine was a normal color.  She denies any ongoing sense of needing to urinate.  She denies abdominal pain, flank pain.  She denies nausea, vomiting, fever, chills.  She is also complaining of a rash on her back which is very pruritic.  Past Medical History:  Diagnosis Date  . Anemia    in the past  . Asthma    uses Symbicort daily and ALbuterol as needed  . Chronic back pain   . Complication of anesthesia    slow to wake up  . Constipation   . Contact dermatitis due to chemicals   . Cough    nonproductive and without fever  . Depression    but doesn't take any meds   . Diabetes mellitus without complication (HCC)    takes Metformin daily  . Eczema   . Fall during current hospitalization 12/30/2015 12/30/2015  . GERD (gastroesophageal reflux disease)    doesn't take any meds  . Glaucoma    uses eye drops daily  . Heart murmur    states it was told to her as a younger woman, no problems that she knows   . History of bronchitis a yr ago  . HLD (hyperlipidemia)    takes Pravastatin daily  . Hypertension    takes Losartan-HCTZ daily  . Insomnia    takes Ambien nightly   . Joint pain   . Joint swelling   . Muscle spasm    takes Baclofen daily as needed  . Osteoarthritis    knees  . Primary localized osteoarthritis of left knee   . Primary localized osteoarthritis  of right knee 12/17/2015  . Pulmonary nodule   . Rupture of right patellar tendon 12/31/2015   Patient fell the second evening after total knee replacement and suffer and open patella tendon rupture of her right knee.  Surgically repaired after an extensive irrigation and debridement on 12/30/2015  . 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 Urinary frequency   . Urinary urgency     Patient Active Problem List   Diagnosis Date Noted  . Rupture of right patellar tendon 12/31/2015  . Fall during current hospitalization 12/30/2015 12/30/2015  . 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 Diverticulitis of colon 12/18/2015  . Primary localized osteoarthritis of right knee 12/17/2015  . Contact dermatitis due to chemicals   . DJD (degenerative joint disease) of knee 01/05/2015  . Primary localized osteoarthritis of left knee   . Osteoarthritis   . Schizoaffective disorder (HCC)   . Diabetes mellitus without complication (HCC)   . Hypertension   . HLD (hyperlipidemia)   . Asthma   . Pulmonary nodule   . Glaucoma   . Insomnia   . Well woman exam with routine gynecological exam 08/04/2014  Past Surgical History:  Procedure Laterality Date  . GALLBLADDER SURGERY  1997  . IRRIGATION AND DEBRIDEMENT KNEE Right 12/30/2015   Procedure: IRRIGATION AND DEBRIDEMENT KNEE;  Surgeon: Salvatore Marvelobert Wainer, MD;  [facility];  Service: Orthopedics;  Laterality: Right;  . PATELLAR TENDON REPAIR Right 12/30/2015   Procedure: PATELLA TENDON REPAIR;  Surgeon: Salvatore Marvelobert Wainer, MD;  [facility];  Service: Orthopedics;  Laterality: Right;  . TOTAL KNEE ARTHROPLASTY Left 01/05/2015   Procedure: TOTAL KNEE ARTHROPLASTY;  Surgeon: Salvatore Marvelobert Wainer, MD;  [facility];  Service: Orthopedics;  Laterality: Left;  . TOTAL KNEE ARTHROPLASTY Right 12/28/2015   Procedure: TOTAL KNEE ARTHROPLASTY;  Surgeon: Salvatore Marvelobert Wainer, MD;  [facility];  Service: Orthopedics;  Laterality: Right;      OB History    Gravida  3   Para  2   Term  2   Preterm      AB  1   Living  2     SAB      TAB  1   Ectopic      Multiple      Live Births               Home Medications    Prior to Admission medications   Medication Sig Start Date End Date Taking? Authorizing Provider  acetaminophen (TYLENOL) 500 MG tablet Take 500 mg by mouth at bedtime.    [provider]  albuterol (PROVENTIL HFA;VENTOLIN HFA) 108 (90 Base) MCG/ACT inhaler Inhale 2 puffs into the lungs every 6 (six) hours as needed for wheezing or shortness of breath. 05/05/18   Bing NeighborsHarris, Kimberly S, FNP  apixaban (ELIQUIS) 2.5 MG TABS tablet 1 tab po BID to prevent blood clots 01/01/16   Shepperson, Kirstin, PA-C  aspirin 81 MG chewable tablet Chew by mouth daily.    [provider]  atorvastatin (LIPITOR) 10 MG tablet Take 10 mg by mouth at bedtime.    [provider]  azithromycin (ZITHROMAX) 250 MG tablet Take 1 tablet (250 mg total) by mouth daily. Take first 2 tablets together, then 1 every day until finished. 05/06/18   Cathie HoopsYu, Amy V, PA-C  budesonide-formoterol (SYMBICORT) 80-4.5 MCG/ACT inhaler Inhale 2 puffs into the lungs 2 (two) times daily. 05/05/18   Bing NeighborsHarris, Kimberly S, FNP  cetirizine (ZYRTEC) 10 MG tablet Take 1 tablet (10 mg total) by mouth daily. 05/05/18   Bing NeighborsHarris, Kimberly S, FNP  diclofenac sodium (VOLTAREN) 1 % GEL Apply topically 4 (four) times daily.    [provider]  latanoprost (XALATAN) 0.005 % ophthalmic solution Place 1 drop into both eyes at bedtime.  07/31/14   [provider]  linaclotide Karlene Einstein(LINZESS) 145 MCG CAPS capsule Take 1 capsule (145 mcg total) by mouth daily before breakfast. Patient not taking: Reported on 03/26/2018 11/29/15   Schorr, Roma KayserKatherine P, NP  LORazepam (ATIVAN) 0.5 MG tablet Take 0.5 mg by mouth every 6 (six) hours as needed for anxiety.    [provider]  losartan-hydrochlorothiazide (HYZAAR) 100-12.5 MG tablet  Take 0.5 tablets by mouth daily.  05/22/15   [provider]  metFORMIN (GLUCOPHAGE) 500 MG tablet Take 500 mg by mouth 2 (two) times daily with a meal.    [provider]  metroNIDAZOLE (FLAGYL) 500 MG tablet Take 1 tablet (500 mg total) by mouth 2 (two) times daily. Patient not taking: Reported on 03/26/2018 04/11/16   Elvina SidleLauenstein, Kurt, MD  oxycodone (OXY-IR) 5 MG capsule Take 1 capsule (5 mg total)  by mouth every 4 (four) hours as needed for pain (Take 1-2 tablets (5-10 mg) for moderate to severe pain). Patient not taking: Reported on 03/26/2018 01/15/16   Kirt Boys, DO  polyethylene glycol [facility] / GLYCOLAX) packet 17grams in 6 oz of water twice a day until bowel movement.  LAXITIVE.  Restart if two days since last bowel movement Patient taking differently: daily as needed. 17grams in 6 oz of water twice a day until bowel movement.  LAXITIVE.  Restart if two days since last bowel movement 01/01/16   Shepperson, Kirstin, PA-C  pravastatin (PRAVACHOL) 80 MG tablet Take 80 mg by mouth daily.    [provider]  predniSONE (DELTASONE) 20 MG tablet Take 40 mg once daily with breakfast. 05/05/18   Bing Neighbors, FNP  triamcinolone ointment (KENALOG) 0.1 %  02/13/18   [provider]  zolpidem (AMBIEN) 5 MG tablet Take 1 tablet (5 mg total) by mouth at bedtime. Patient not taking: Reported on 03/26/2018 01/04/16   Sharon Seller, NP    Family History Family History  Problem Relation Age of Onset  . Heart attack Father   . Glaucoma Mother   . Diabetes Mellitus II Mother   . Hypertension Sister   . Cancer Maternal Grandmother   . Colon cancer Maternal Grandmother     Social History Social History   Tobacco Use  . Smoking status: Former Games developer  . Smokeless tobacco: Never Used  . Tobacco comment: quit smoking 37yrs ago  Substance Use Topics  . Alcohol use: No  . Drug use: No     Allergies   Bactrim [sulfamethoxazole-trimethoprim]; Ivp dye  [iodinated diagnostic agents]; Clonazepam; Sulfa antibiotics; Codeine; Other; and Vicodin [hydrocodone-acetaminophen]   Review of Systems Review of Systems  All other systems reviewed and are negative.    Physical Exam Updated Vital Signs BP 131/64   Pulse 100   Temp 98.5 F (36.9 C) (Oral)   Resp 11   SpO2 100%   Physical Exam Vitals signs and nursing note reviewed.    73 year old female, resting comfortably and in no acute distress. Vital signs are normal. Oxygen saturation is 100%, which is normal. Head is normocephalic and atraumatic. PERRLA, EOMI. Oropharynx is clear. Neck is nontender and supple without adenopathy or JVD. Back is nontender and there is no CVA tenderness. Lungs are clear without rales, wheezes, or rhonchi. Chest is nontender. Heart has regular rate and rhythm without murmur. Abdomen is soft, flat, nontender without masses or hepatosplenomegaly and peristalsis is normoactive.  Bladder is not distended. Extremities have no cyanosis or edema, full range of motion is present. Skin is warm and dry.  Papular rash is present across the lower back, nonspecific in appearance. Neurologic: Mental status is normal, cranial nerves are intact, there are no motor or sensory deficits.  ED Treatments / Results  Labs (all labs ordered are listed, but only abnormal results are displayed) Labs Reviewed  URINALYSIS, ROUTINE W REFLEX MICROSCOPIC - Abnormal; Notable for the following components:      Result Value   APPearance HAZY (*)    Leukocytes,Ua MODERATE (*)    Bacteria, UA FEW (*)    All other components within normal limits    Procedures Procedures  Medications Ordered in ED Medications - No data to display   Initial Impression / Assessment and Plan / ED Course  I have reviewed the triage vital signs and the nursing notes.  Pertinent lab results that were available during my  care of the patient were reviewed by me and considered in my medical decision  making (see chart for details).  Transient difficulty with urination with discoloration of urine.  Urinalysis here is normal.  Nonspecific dermatitis.  Patient does state that she showers twice a day.  Advised limiting the soap exposure to that area.  She is reassured regarding the findings on urinalysis.  Bladder scan for residual urine after patient voided was 35 mL.  Patient is again given reassurance.  Advised to follow-up with urology if she has further problems.  Given prescription for hydrocortisone cream to apply to the rash.  Final Clinical Impressions(s) / ED Diagnoses   Final diagnoses:  Dermatitis    ED Discharge Orders         Ordered    hydrocortisone cream 1 %     10/16/18 0305           Dione Booze, MD 10/16/18 [phone]

## 2018-10-16 NOTE — ED Notes (Signed)
Sent Urine Culture down with UA.

## 2018-10-16 NOTE — Discharge Instructions (Signed)
If you have any further problems with urinating, see the urologist.

## 2018-10-16 NOTE — ED Notes (Signed)
Patient verbalizes understanding of discharge instructions. Opportunity for questioning and answers were provided. Armband removed by staff, pt discharged from ED ambulatory.   

## 2018-10-16 NOTE — ED Notes (Addendum)
Bladder scan volume 35 ml

## 2018-10-29 ENCOUNTER — Other Ambulatory Visit: Payer: Self-pay

## 2018-12-12 ENCOUNTER — Other Ambulatory Visit: Payer: Self-pay

## 2018-12-12 ENCOUNTER — Encounter (HOSPITAL_COMMUNITY): Payer: Self-pay

## 2018-12-12 ENCOUNTER — Ambulatory Visit (HOSPITAL_COMMUNITY)
Admission: EM | Admit: 2018-12-12 | Discharge: 2018-12-12 | Disposition: A | Discharge status: Home / Self Care | Payer: Medicare HMO | Attending: Urgent Care | Admitting: Urgent Care

## 2018-12-12 DIAGNOSIS — R21 Rash and other nonspecific skin eruption: Secondary | ICD-10-CM | POA: Diagnosis not present

## 2018-12-12 DIAGNOSIS — L259 Unspecified contact dermatitis, unspecified cause: Secondary | ICD-10-CM

## 2018-12-12 DIAGNOSIS — L299 Pruritus, unspecified: Secondary | ICD-10-CM

## 2018-12-12 MED ORDER — VALACYCLOVIR HCL 1 G PO TABS: 1000.0000 mg | ORAL_TABLET | Freq: Three times a day (TID) | ORAL | 0 refills | Status: AC

## 2018-12-12 MED ORDER — HYDROXYZINE HCL 25 MG PO TABS: 12.5000 mg | ORAL_TABLET | Freq: Three times a day (TID) | ORAL | 1 refills | Status: DC | PRN

## 2018-12-12 NOTE — ED Provider Notes (Signed)
MRN: [id number] DOB: 05-25-1946  Subjective:   Brooke Davidson is a 73 y.o. female presenting for 1 week history of pruritic rash that is now painful.  Patient reports that symptoms started over her left forearm left thigh and lower abdomen and has been moderate to severely itchy.  She is now having a new rash over her torso on the left side and over her right low back that is more painful.  Symptoms started after she used a new detergent.  She denies fever, nausea, vomiting, belly pain, oral swelling, difficulty with her breathing.  She has not started a new foods her start new medications recently.  No current facility-administered medications for this encounter.   Current Outpatient Medications:  .  aspirin 81 MG chewable tablet, Chew by mouth daily., Disp: , Rfl:  .  budesonide-formoterol (SYMBICORT) 80-4.5 MCG/ACT inhaler, Inhale 2 puffs into the lungs 2 (two) times daily., Disp: 1 Inhaler, Rfl: 0 .  diclofenac sodium (VOLTAREN) 1 % GEL, Apply topically 4 (four) times daily., Disp: , Rfl:  .  losartan-hydrochlorothiazide (HYZAAR) 100-12.5 MG tablet, Take 0.5 tablets by mouth daily. , Disp: , Rfl: 2 .  zolpidem (AMBIEN) 5 MG tablet, Take 1 tablet (5 mg total) by mouth at bedtime., Disp: 30 tablet, Rfl: 5 .  acetaminophen (TYLENOL) 500 MG tablet, Take 500 mg by mouth at bedtime., Disp: , Rfl:  .  albuterol (PROVENTIL HFA;VENTOLIN HFA) 108 (90 Base) MCG/ACT inhaler, Inhale 2 puffs into the lungs every 6 (six) hours as needed for wheezing or shortness of breath., Disp: 8 g, Rfl: 0 .  atorvastatin (LIPITOR) 10 MG tablet, Take 10 mg by mouth at bedtime., Disp: , Rfl:  .  cetirizine (ZYRTEC) 10 MG tablet, Take 1 tablet (10 mg total) by mouth daily., Disp: 90 tablet, Rfl: 0 .  hydrocortisone cream 1 %, Apply to affected area 2 times daily, Disp: 30 g, Rfl: 0 .  latanoprost (XALATAN) 0.005 % ophthalmic solution, Place 1 drop into both eyes at bedtime. , Disp: , Rfl: 12 .  linaclotide (LINZESS) 145  MCG CAPS capsule, Take 1 capsule (145 mcg total) by mouth daily before breakfast. (Patient not taking: Reported on 03/26/2018), Disp: 30 capsule, Rfl: 0 .  LORazepam (ATIVAN) 0.5 MG tablet, Take 0.5 mg by mouth every 6 (six) hours as needed for anxiety., Disp: , Rfl:  .  metFORMIN (GLUCOPHAGE) 500 MG tablet, Take 500 mg by mouth 2 (two) times daily with a meal., Disp: , Rfl:  .  polyethylene glycol (MIRALAX / GLYCOLAX) packet, 17grams in 6 oz of water twice a day until bowel movement.  LAXITIVE.  Restart if two days since last bowel movement (Patient taking differently: daily as needed. 17grams in 6 oz of water twice a day until bowel movement.  LAXITIVE.  Restart if two days since last bowel movement), Disp: 14 each, Rfl: 0 .  pravastatin (PRAVACHOL) 80 MG tablet, Take 80 mg by mouth daily., Disp: , Rfl:  .  triamcinolone ointment (KENALOG) 0.1 %, , Disp: , Rfl:    Allergies  Allergen Reactions  . Bactrim [Sulfamethoxazole-Trimethoprim] Swelling    Tongue swells  . Ivp Dye [Iodinated Diagnostic Agents] Hives  . Clonazepam     dizziness  . Sulfa Antibiotics Swelling  . Codeine Itching  . Other Itching and Rash    Perfume  . Vicodin [Hydrocodone-Acetaminophen] Itching    Past Medical History:  Diagnosis Date  . Anemia    in the past  . Asthma  uses Symbicort daily and ALbuterol as needed  . Chronic back pain   . Complication of anesthesia    slow to wake up  . Constipation   . Contact dermatitis due to chemicals   . Cough    nonproductive and without fever  . Depression    but doesn't take any meds   . Diabetes mellitus without complication (HCC)    takes Metformin daily  . Eczema   . Fall during current hospitalization 12/30/2015 12/30/2015  . GERD (gastroesophageal reflux disease)    doesn't take any meds  . Glaucoma    uses eye drops daily  . Heart murmur    states it was told to her as a younger woman, no problems that she knows   . History of bronchitis a yr ago  .  HLD (hyperlipidemia)    takes Pravastatin daily  . Hypertension    takes Losartan-HCTZ daily  . Insomnia    takes Ambien nightly   . Joint pain   . Joint swelling   . Muscle spasm    takes Baclofen daily as needed  . Osteoarthritis    knees  . Primary localized osteoarthritis of left knee   . Primary localized osteoarthritis of right knee 12/17/2015  . Pulmonary nodule   . Rupture of right patellar tendon 12/31/2015   Patient fell the second evening after total knee replacement and suffer and open patella tendon rupture of her right knee.  Surgically repaired after an extensive irrigation and debridement on 12/30/2015  . 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 Urinary frequency   . Urinary urgency      Past Surgical History:  Procedure Laterality Date  . GALLBLADDER SURGERY  1997  . IRRIGATION AND DEBRIDEMENT KNEE Right 12/30/2015   Procedure: IRRIGATION AND DEBRIDEMENT KNEE;  Surgeon: Salvatore Marvelobert Wainer, MD;  [facility];  Service: Orthopedics;  Laterality: Right;  . PATELLAR TENDON REPAIR Right 12/30/2015   Procedure: PATELLA TENDON REPAIR;  Surgeon: Salvatore Marvelobert Wainer, MD;  [facility];  Service: Orthopedics;  Laterality: Right;  . TOTAL KNEE ARTHROPLASTY Left 01/05/2015   Procedure: TOTAL KNEE ARTHROPLASTY;  Surgeon: Salvatore Marvelobert Wainer, MD;  [facility];  Service: Orthopedics;  Laterality: Left;  . TOTAL KNEE ARTHROPLASTY Right 12/28/2015   Procedure: TOTAL KNEE ARTHROPLASTY;  Surgeon: Salvatore Marvelobert Wainer, MD;  [facility];  Service: Orthopedics;  Laterality: Right;    ROS  Objective:   Vitals: BP 135/86 (BP Location: Left Arm)   Pulse 96   Temp 98.2 F (36.8 C) (Oral)   Resp 18   SpO2 100%   Physical Exam Constitutional:      General: She is not in acute distress.    Appearance: Normal appearance. She is well-developed. She is not ill-appearing, toxic-appearing or diaphoretic.  HENT:     Head: Normocephalic and atraumatic.     Nose: Nose normal.      Mouth/Throat:     Mouth: Mucous membranes are moist.  Eyes:     Extraocular Movements: Extraocular movements intact.     Pupils: Pupils are equal, round, and reactive to light.  Cardiovascular:     Rate and Rhythm: Normal rate and regular rhythm.     Pulses: Normal pulses.     Heart sounds: Normal heart sounds. No murmur. No friction rub. No gallop.   Pulmonary:     Effort: Pulmonary effort is normal. No respiratory distress.     Breath sounds: Normal breath sounds. No stridor. No  wheezing, rhonchi or rales.  Skin:    General: Skin is warm and dry.     Findings: No rash.       Neurological:     Mental Status: She is alert and oriented to person, place, and time.  Psychiatric:        Mood and Affect: Mood normal.        Behavior: Behavior normal.        Thought Content: Thought content normal.    Assessment and Plan :   1. Rash and nonspecific skin eruption   2. Itching   3. Contact dermatitis, unspecified contact dermatitis type, unspecified trigger    I suspect the patient start out having contact dermatitis secondary to using a new detergent.  Will use Vistaril for this.  Recommended she switch to soaps and detergents for gentle skin.  I also suspect the patient subsequently developed shingles rash which will use Valtrex for.  I declined to use topical therapies for contact dermatitis as I am afraid she would use this for her shingles rash as well. Counseled patient on potential for adverse effects with medications prescribed/recommended today, ER and return-to-clinic precautions discussed, patient verbalized understanding.      Jaynee Eagles, PA-C 12/12/18 1020

## 2018-12-12 NOTE — ED Triage Notes (Signed)
Patient presents to Urgent Care with complaints of itchy rash on left flank and buttocks since washing her clothes in a new detergent. Patient reports she has been putting vaseline on her itchy places. Pt brought the box of detergent into this [facility] to show staff, pt was trying to sell the detergent to other patients in the waiting room prior to being brought back to an exam room.

## 2018-12-14 ENCOUNTER — Telehealth (HOSPITAL_COMMUNITY): Payer: Self-pay | Admitting: Emergency Medicine

## 2018-12-14 NOTE — Telephone Encounter (Signed)
Pt called stating she is feeling worse, pt states its spreading, pt recommended to return to be seen, pt states she will return tomorrow.

## 2018-12-15 ENCOUNTER — Other Ambulatory Visit: Payer: Self-pay

## 2018-12-15 ENCOUNTER — Ambulatory Visit (HOSPITAL_COMMUNITY)
Admission: EM | Admit: 2018-12-15 | Discharge: 2018-12-15 | Disposition: A | Discharge status: Home / Self Care | Payer: Medicare HMO | Attending: Family Medicine | Admitting: Family Medicine

## 2018-12-15 ENCOUNTER — Encounter (HOSPITAL_COMMUNITY): Payer: Self-pay

## 2018-12-15 DIAGNOSIS — L240 Irritant contact dermatitis due to detergents: Secondary | ICD-10-CM | POA: Diagnosis not present

## 2018-12-15 MED ORDER — METHYLPREDNISOLONE SODIUM SUCC 125 MG IJ SOLR
INTRAMUSCULAR | Status: AC
  Filled 2018-12-15: qty 2

## 2018-12-15 MED ORDER — METHYLPREDNISOLONE SODIUM SUCC 125 MG IJ SOLR
125.0000 mg | Freq: Once | INTRAMUSCULAR | Status: AC
  Administered 2018-12-15: 125 mg via INTRAMUSCULAR

## 2018-12-15 NOTE — Discharge Instructions (Addendum)
You were given a steroid shot today to help with your itching and rash.  Continue to take the medications prescribed on your previous visit which were Valtrex and hydroxyzine.  Try not to scratch the rash as this will make it worse.  Change to a detergent for sensitive skin.  Return here or go to the emergency department if your rash worsens; or if you develop other symptoms such as fever, chills, cough, vomiting, abdominal pain, shortness of breath, or other concerning symptoms.

## 2018-12-15 NOTE — ED Triage Notes (Signed)
Pt presents for a follow up for rash from her visit on the 1st, pt complains that medication prescribed is not helping her rash areas.

## 2018-12-15 NOTE — ED Provider Notes (Signed)
[facility]    CSN: [id number] Arrival date & time: 12/15/18  1617     History   Chief Complaint Chief Complaint  Patient presents with  . Follow-up    Rash    HPI Brooke Davidson is a 73 y.o. female.   Patient presents today with ongoing pruritic rash on trunk, left upper leg, and buttocks.  The rash has been present for 1.5 weeks; she was seen for this here on 12/12/2018 and treated with Valtrex and hydroxyzine.  She reports normal relief with these medications.  She states the rash began when she switch laundry detergents.  She denies fever, chills, shortness of breath, difficulty swallowing, chest pain, abdominal pain, or other symptoms.  The history is provided by the patient.    Past Medical History:  Diagnosis Date  . Anemia    in the past  . Asthma    uses Symbicort daily and ALbuterol as needed  . Chronic back pain   . Complication of anesthesia    slow to wake up  . Constipation   . Contact dermatitis due to chemicals   . Cough    nonproductive and without fever  . Depression    but doesn't take any meds   . Diabetes mellitus without complication (HCC)    takes Metformin daily  . Eczema   . Fall during current hospitalization 12/30/2015 12/30/2015  . GERD (gastroesophageal reflux disease)    doesn't take any meds  . Glaucoma    uses eye drops daily  . Heart murmur    states it was told to her as a younger woman, no problems that she knows   . History of bronchitis a yr ago  . HLD (hyperlipidemia)    takes Pravastatin daily  . Hypertension    takes Losartan-HCTZ daily  . Insomnia    takes Ambien nightly   . Joint pain   . Joint swelling   . Muscle spasm    takes Baclofen daily as needed  . Osteoarthritis    knees  . Primary localized osteoarthritis of left knee   . Primary localized osteoarthritis of right knee 12/17/2015  . Pulmonary nodule   . Rupture of right patellar tendon 12/31/2015   Patient fell the second evening after total knee  replacement and suffer and open patella tendon rupture of her right knee.  Surgically repaired after an extensive irrigation and debridement on 12/30/2015  . 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 Urinary frequency   . Urinary urgency     Patient Active Problem List   Diagnosis Date Noted  . Rupture of right patellar tendon 12/31/2015  . Fall during current hospitalization 12/30/2015 12/30/2015  . 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 Diverticulitis of colon 12/18/2015  . Primary localized osteoarthritis of right knee 12/17/2015  . Contact dermatitis due to chemicals   . DJD (degenerative joint disease) of knee 01/05/2015  . Primary localized osteoarthritis of left knee   . Osteoarthritis   . Schizoaffective disorder (HCC)   . Diabetes mellitus without complication (HCC)   . Hypertension   . HLD (hyperlipidemia)   . Asthma   . Pulmonary nodule   . Glaucoma   . Insomnia   . Well woman exam with routine gynecological exam 08/04/2014    Past Surgical History:  Procedure Laterality Date  . GALLBLADDER SURGERY  1997  . IRRIGATION AND DEBRIDEMENT KNEE Right 12/30/2015   Procedure: IRRIGATION AND DEBRIDEMENT  KNEE;  Surgeon: Robert Wainer, MD;  [facility];  Service: Orthopedics;  Laterality: Right;  . PATELLAR TENDON REPAIR Right 12/30/2015   Procedure: PATELLA TENDON REPAIR;  Surgeon: Salvatore Marvelobert Wainer, MD;  [facility];  Service: Orthopedics;  Laterality: Right;  . TOTAL KNEE ARTHROPLASTY Left 01/05/2015   Procedure: TOTAL KNEE ARTHROPLASTY;  Surgeon: Salvatore Marvelobert Wainer, MD;  [facility];  Service: Orthopedics;  Laterality: Left;  . TOTAL KNEE ARTHROPLASTY Right 12/28/2015   Procedure: TOTAL KNEE ARTHROPLASTY;  Surgeon: Salvatore Marvelobert Wainer, MD;  [facility];  Service: Orthopedics;  Laterality: Right;    OB History    Gravida  3   Para  2   Term  2   Preterm      AB  1   Living  2     SAB      TAB  1   Ectopic      Multiple       Live Births               Home Medications    Prior to Admission medications   Medication Sig Start Date End Date Taking? Authorizing Provider  acetaminophen (TYLENOL) 500 MG tablet Take 500 mg by mouth at bedtime.    [provider]  albuterol (PROVENTIL HFA;VENTOLIN HFA) 108 (90 Base) MCG/ACT inhaler Inhale 2 puffs into the lungs every 6 (six) hours as needed for wheezing or shortness of breath. 05/05/18   Bing NeighborsHarris, Kimberly S, FNP  aspirin 81 MG chewable tablet Chew by mouth daily.    [provider]  atorvastatin (LIPITOR) 10 MG tablet Take 10 mg by mouth at bedtime.    [provider]  budesonide-formoterol (SYMBICORT) 80-4.5 MCG/ACT inhaler Inhale 2 puffs into the lungs 2 (two) times daily. 05/05/18   Bing NeighborsHarris, Kimberly S, FNP  cetirizine (ZYRTEC) 10 MG tablet Take 1 tablet (10 mg total) by mouth daily. 05/05/18   Bing NeighborsHarris, Kimberly S, FNP  diclofenac sodium (VOLTAREN) 1 % GEL Apply topically 4 (four) times daily.    [provider]  hydrocortisone cream 1 % Apply to affected area 2 times daily 10/16/18   Dione BoozeGlick, David, MD  hydrOXYzine (ATARAX/VISTARIL) 25 MG tablet Take 0.5-1 tablets (12.5-25 mg total) by mouth every 8 (eight) hours as needed for itching. 12/12/18   Wallis BambergMani, Mario, PA-C  latanoprost (XALATAN) 0.005 % ophthalmic solution Place 1 drop into both eyes at bedtime.  07/31/14   [provider]  linaclotide Karlene Einstein(LINZESS) 145 MCG CAPS capsule Take 1 capsule (145 mcg total) by mouth daily before breakfast. Patient not taking: Reported on 03/26/2018 11/29/15   Schorr, Roma KayserKatherine P, NP  LORazepam (ATIVAN) 0.5 MG tablet Take 0.5 mg by mouth every 6 (six) hours as needed for anxiety.    [provider]  losartan-hydrochlorothiazide (HYZAAR) 100-12.5 MG tablet Take 0.5 tablets by mouth daily.  05/22/15   [provider]  metFORMIN (GLUCOPHAGE) 500 MG tablet Take 500 mg by mouth 2 (two) times daily with a meal.    [provider]  polyethylene glycol (MIRALAX / GLYCOLAX) packet 17grams in 6 oz of water twice a day until bowel movement.  LAXITIVE.  Restart if two days since last bowel movement Patient taking differently: daily as needed. 17grams in 6 oz of water twice a day until bowel movement.  LAXITIVE.  Restart if two days since last bowel movement 01/01/16   Shepperson, Kirstin, PA-C  pravastatin (PRAVACHOL) 80 MG tablet Take 80 mg by mouth daily.  [provider]  triamcinolone ointment (KENALOG) 0.1 %  02/13/18   [provider]  valACYclovir (VALTREX) 1000 MG tablet Take 1 tablet (1,000 mg total) by mouth 3 (three) times daily for 10 days. 12/12/18 12/22/18  Wallis BambergMani, Mario, PA-C  zolpidem (AMBIEN) 5 MG tablet Take 1 tablet (5 mg total) by mouth at bedtime. 01/04/16   Sharon SellerEubanks, Jessica K, NP    Family History Family History  Problem Relation Age of Onset  . Heart attack Father   . Glaucoma Mother   . Diabetes Mellitus II Mother   . Hypertension Sister   . Cancer Maternal Grandmother   . Colon cancer Maternal Grandmother     Social History Social History   Tobacco Use  . Smoking status: Former Games developermoker  . Smokeless tobacco: Never Used  . Tobacco comment: quit smoking 7120yrs ago  Substance Use Topics  . Alcohol use: No  . Drug use: No     Allergies   Bactrim [sulfamethoxazole-trimethoprim], Ivp dye [iodinated diagnostic agents], Clonazepam, Sulfa antibiotics, Codeine, Other, and Vicodin [hydrocodone-acetaminophen]   Review of Systems Review of Systems  Constitutional: Negative for chills and fever.  HENT: Negative for ear pain and sore throat.   Eyes: Negative for pain and visual disturbance.  Respiratory: Negative for cough and shortness of breath.   Cardiovascular: Negative for chest pain and palpitations.  Gastrointestinal: Negative for abdominal pain and vomiting.  Genitourinary: Negative for dysuria and hematuria.  Musculoskeletal: Negative for arthralgias and back pain.  Skin:  Positive for rash. Negative for color change.  Neurological: Negative for seizures and syncope.  All other systems reviewed and are negative.    Physical Exam Triage Vital Signs ED Triage Vitals  Enc Vitals Group     BP 12/15/18 1710 (!) 146/83     Pulse Rate 12/15/18 1710 (!) 110     Resp 12/15/18 1710 17     Temp 12/15/18 1710 98.1 F (36.7 C)     Temp Source 12/15/18 1710 Oral     SpO2 12/15/18 1710 97 %     Weight --      Height --      Head Circumference --      Peak Flow --      Pain Score 12/15/18 1708 0     Pain Loc --      Pain Edu? --      Excl. in GC? --    No data found.  Updated Vital Signs BP (!) 146/83 (BP Location: Left Arm)   Pulse (!) 110   Temp 98.1 F (36.7 C) (Oral)   Resp 17   SpO2 97%   Visual Acuity Right Eye Distance:   Left Eye Distance:   Bilateral Distance:    Right Eye Near:   Left Eye Near:    Bilateral Near:     Physical Exam Vitals signs and nursing note reviewed.  Constitutional:          General: She is not in acute distress.    Appearance: She is well-developed.  HENT:     Head: Normocephalic and atraumatic.  Eyes:     Conjunctiva/sclera: Conjunctivae normal.  Neck:     Musculoskeletal: Neck supple.  Cardiovascular:     Rate and Rhythm: Normal rate and regular rhythm.     Heart sounds: No murmur.  Pulmonary:     Effort: Pulmonary effort is normal. No respiratory distress.     Breath sounds: Normal breath sounds.  Abdominal:  Palpations: Abdomen is soft.     Tenderness: There is no abdominal tenderness.  Skin:    General: Skin is warm and dry.  Neurological:     Mental Status: She is alert.      UC Treatments / Results  Labs (all labs ordered are listed, but only abnormal results are displayed) Labs Reviewed - No data to display  EKG   Radiology No results found.  Procedures Procedures (including critical care time)  Medications Ordered in UC Medications  methylPREDNISolone sodium  succinate (SOLU-MEDROL) 125 mg/2 mL injection 125 mg (has no administration in time range)  methylPREDNISolone sodium succinate (SOLU-MEDROL) 125 mg/2 mL injection (has no administration in time range)    Initial Impression / Assessment and Plan / UC Course  I have reviewed the triage vital signs and the nursing notes.  Pertinent labs & imaging results that were available during my care of the patient were reviewed by me and considered in my medical decision making (see chart for details).   Irritant contact dermatitis due to detergent.  Solu-Medrol injection given today instructed patient to continue Valtrex and hydroxyzine as prescribed.  Instructed patient to return here or go to the emergency department if her rash worsens or if she develops fever, chills, cough, vomiting, abdominal pain, shortness of breath, other concerning symptoms.    Final Clinical Impressions(s) / UC Diagnoses   Final diagnoses:  Irritant contact dermatitis due to detergent     Discharge Instructions     You were given a steroid shot today to help with your itching and rash.  Continue to take the medications prescribed on your previous visit which were Valtrex and hydroxyzine.  Try not to scratch the rash as this will make it worse.  Change to a detergent for sensitive skin.  Return here or go to the emergency department if your rash worsens; or if you develop other symptoms such as fever, chills, cough, vomiting, abdominal pain, shortness of breath, or other concerning symptoms.    ED Prescriptions    None     Controlled Substance Prescriptions [place] Controlled Substance Registry consulted? Not Applicable   Sharion Balloon, NP 12/15/18 1752

## 2019-06-04 ENCOUNTER — Emergency Department (HOSPITAL_COMMUNITY)
Admission: EM | Admit: 2019-06-04 | Discharge: 2019-06-04 | Disposition: A | Discharge status: Home / Self Care | Payer: Medicare Other | Source: Home / Self Care | Attending: Emergency Medicine | Admitting: Emergency Medicine

## 2019-06-04 ENCOUNTER — Emergency Department (HOSPITAL_COMMUNITY): Payer: Medicare Other

## 2019-06-04 ENCOUNTER — Other Ambulatory Visit: Payer: Self-pay

## 2019-06-04 DIAGNOSIS — N183 Chronic kidney disease, stage 3 unspecified: Secondary | ICD-10-CM | POA: Diagnosis not present

## 2019-06-04 DIAGNOSIS — Z7982 Long term (current) use of aspirin: Secondary | ICD-10-CM | POA: Insufficient documentation

## 2019-06-04 DIAGNOSIS — K219 Gastro-esophageal reflux disease without esophagitis: Secondary | ICD-10-CM | POA: Diagnosis not present

## 2019-06-04 DIAGNOSIS — Z91041 Radiographic dye allergy status: Secondary | ICD-10-CM | POA: Diagnosis not present

## 2019-06-04 DIAGNOSIS — Z7984 Long term (current) use of oral hypoglycemic drugs: Secondary | ICD-10-CM | POA: Insufficient documentation

## 2019-06-04 DIAGNOSIS — Z79899 Other long term (current) drug therapy: Secondary | ICD-10-CM | POA: Insufficient documentation

## 2019-06-04 DIAGNOSIS — Z87891 Personal history of nicotine dependence: Secondary | ICD-10-CM | POA: Diagnosis not present

## 2019-06-04 DIAGNOSIS — R079 Chest pain, unspecified: Secondary | ICD-10-CM | POA: Diagnosis present

## 2019-06-04 DIAGNOSIS — Z8249 Family history of ischemic heart disease and other diseases of the circulatory system: Secondary | ICD-10-CM | POA: Diagnosis not present

## 2019-06-04 DIAGNOSIS — G4700 Insomnia, unspecified: Secondary | ICD-10-CM | POA: Diagnosis not present

## 2019-06-04 DIAGNOSIS — R0789 Other chest pain: Secondary | ICD-10-CM | POA: Insufficient documentation

## 2019-06-04 DIAGNOSIS — M549 Dorsalgia, unspecified: Secondary | ICD-10-CM | POA: Diagnosis not present

## 2019-06-04 DIAGNOSIS — Z888 Allergy status to other drugs, medicaments and biological substances status: Secondary | ICD-10-CM | POA: Diagnosis not present

## 2019-06-04 DIAGNOSIS — Z5320 Procedure and treatment not carried out because of patient's decision for unspecified reasons: Secondary | ICD-10-CM | POA: Insufficient documentation

## 2019-06-04 DIAGNOSIS — N179 Acute kidney failure, unspecified: Secondary | ICD-10-CM | POA: Diagnosis not present

## 2019-06-04 DIAGNOSIS — E119 Type 2 diabetes mellitus without complications: Secondary | ICD-10-CM | POA: Insufficient documentation

## 2019-06-04 DIAGNOSIS — Z20828 Contact with and (suspected) exposure to other viral communicable diseases: Secondary | ICD-10-CM | POA: Diagnosis not present

## 2019-06-04 DIAGNOSIS — F419 Anxiety disorder, unspecified: Secondary | ICD-10-CM | POA: Diagnosis not present

## 2019-06-04 DIAGNOSIS — Z882 Allergy status to sulfonamides status: Secondary | ICD-10-CM | POA: Diagnosis not present

## 2019-06-04 DIAGNOSIS — E1122 Type 2 diabetes mellitus with diabetic chronic kidney disease: Secondary | ICD-10-CM | POA: Diagnosis not present

## 2019-06-04 DIAGNOSIS — H409 Unspecified glaucoma: Secondary | ICD-10-CM | POA: Diagnosis not present

## 2019-06-04 DIAGNOSIS — Z96653 Presence of artificial knee joint, bilateral: Secondary | ICD-10-CM | POA: Diagnosis not present

## 2019-06-04 DIAGNOSIS — E785 Hyperlipidemia, unspecified: Secondary | ICD-10-CM | POA: Diagnosis not present

## 2019-06-04 DIAGNOSIS — Z7901 Long term (current) use of anticoagulants: Secondary | ICD-10-CM | POA: Insufficient documentation

## 2019-06-04 DIAGNOSIS — I4819 Other persistent atrial fibrillation: Secondary | ICD-10-CM | POA: Diagnosis not present

## 2019-06-04 DIAGNOSIS — G8929 Other chronic pain: Secondary | ICD-10-CM | POA: Diagnosis not present

## 2019-06-04 DIAGNOSIS — I129 Hypertensive chronic kidney disease with stage 1 through stage 4 chronic kidney disease, or unspecified chronic kidney disease: Secondary | ICD-10-CM | POA: Diagnosis not present

## 2019-06-04 DIAGNOSIS — Z885 Allergy status to narcotic agent status: Secondary | ICD-10-CM | POA: Diagnosis not present

## 2019-06-04 DIAGNOSIS — R5383 Other fatigue: Secondary | ICD-10-CM | POA: Insufficient documentation

## 2019-06-04 DIAGNOSIS — R0602 Shortness of breath: Secondary | ICD-10-CM | POA: Insufficient documentation

## 2019-06-04 DIAGNOSIS — I4892 Unspecified atrial flutter: Secondary | ICD-10-CM | POA: Diagnosis not present

## 2019-06-04 DIAGNOSIS — I4891 Unspecified atrial fibrillation: Secondary | ICD-10-CM | POA: Insufficient documentation

## 2019-06-04 DIAGNOSIS — Y92009 Unspecified place in unspecified non-institutional (private) residence as the place of occurrence of the external cause: Secondary | ICD-10-CM | POA: Diagnosis not present

## 2019-06-04 DIAGNOSIS — J45909 Unspecified asthma, uncomplicated: Secondary | ICD-10-CM | POA: Diagnosis not present

## 2019-06-04 LAB — CBC
HCT: 40.7 % (ref 36.0–46.0)
Hemoglobin: 13 g/dL (ref 12.0–15.0)
MCH: 26.1 pg (ref 26.0–34.0)
MCHC: 31.9 g/dL (ref 30.0–36.0)
MCV: 81.7 fL (ref 80.0–100.0)
Platelets: 316 10*3/uL (ref 150–400)
RBC: 4.98 MIL/uL (ref 3.87–5.11)
RDW: 13.9 % (ref 11.5–15.5)
WBC: 5.7 10*3/uL (ref 4.0–10.5)
nRBC: 0 % (ref 0.0–0.2)

## 2019-06-04 LAB — BASIC METABOLIC PANEL
Anion gap: 11 (ref 5–15)
BUN: 12 mg/dL (ref 8–23)
CO2: 21 mmol/L — ABNORMAL LOW (ref 22–32)
Calcium: 9.3 mg/dL (ref 8.9–10.3)
Chloride: 105 mmol/L (ref 98–111)
Creatinine, Ser: 1.11 mg/dL — ABNORMAL HIGH (ref 0.44–1.00)
GFR calc Af Amer: 57 mL/min — ABNORMAL LOW (ref >60)
GFR calc non Af Amer: 50 mL/min — ABNORMAL LOW (ref >60)
Glucose, Bld: 98 mg/dL (ref 70–99)
Potassium: 4.3 mmol/L (ref 3.5–5.1)
Sodium: 137 mmol/L (ref 135–145)

## 2019-06-04 LAB — TROPONIN I (HIGH SENSITIVITY): Troponin I (High Sensitivity): 4 ng/L (ref <18)

## 2019-06-04 NOTE — ED Triage Notes (Signed)
Pt here, sent by PCP for abnormal lab. Pt having central chest pain with shob x 3 days, worse with exertion. Recently placed on Eliquis for afib.

## 2019-06-04 NOTE — ED Provider Notes (Signed)
[facility] EMERGENCY DEPARTMENT Provider Note   CSN: [id number] Arrival date & time: 06/04/19  1700     History Chief Complaint  Patient presents with  . Chest Pain  . Abnormal Lab    Brooke Davidson is a 73 y.o. female.  The history is provided by the patient and medical records. No language interpreter was used.  Chest Pain Abnormal Lab      73 year old female with history of atrial fibrillation recently placed on Eliquis, asthma, anemia, diabetes, GERD, hypertension presents ED at the recommendation of PCP for evaluation of chest pain.  Patient report for the past 2 to 3 days she has had recurrent central chest pain with shortness of breath and generalized fatigue.  Pain would last sometimes all day long and worsening with exertion improves with rest.  She was seen at her PCP office today for her complaints, and received sublingual nitro that brought her pain from a 4 out of 10 to a 0 of 10.  PCP also discussed with patient's cardiologist and recommend patient to come to the ER for cardiac work-up which may include a stress test.  At this time patient denies any active pain.  She does not complain of any fever chills no runny nose sneezing or coughing no nausea vomiting diarrhea no loss of taste or smell and no recent sick contact with anyone with COVID-19.  Patient furthermore denies any prior history of MI or PE.  She has been compliant with her medication.  Patient states she is very active and she is frustrated that she may have a heart condition.  Past Medical History:  Diagnosis Date  . Anemia    in the past  . Asthma    uses Symbicort daily and ALbuterol as needed  . Chronic back pain   . Complication of anesthesia    slow to wake up  . Constipation   . Contact dermatitis due to chemicals   . Cough    nonproductive and without fever  . Depression    but doesn't take any meds   . Diabetes mellitus without complication (HCC)    takes Metformin  daily  . Eczema   . Fall during current hospitalization 12/30/2015 12/30/2015  . GERD (gastroesophageal reflux disease)    doesn't take any meds  . Glaucoma    uses eye drops daily  . Heart murmur    states it was told to her as a younger woman, no problems that she knows   . History of bronchitis a yr ago  . HLD (hyperlipidemia)    takes Pravastatin daily  . Hypertension    takes Losartan-HCTZ daily  . Insomnia    takes Ambien nightly   . Joint pain   . Joint swelling   . Muscle spasm    takes Baclofen daily as needed  . Osteoarthritis    knees  . Primary localized osteoarthritis of left knee   . Primary localized osteoarthritis of right knee 12/17/2015  . Pulmonary nodule   . Rupture of right patellar tendon 12/31/2015   Patient fell the second evening after total knee replacement and suffer and open patella tendon rupture of her right knee.  Surgically repaired after an extensive irrigation and debridement on 12/30/2015  . 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 Urinary frequency   . Urinary urgency     Patient Active Problem List   Diagnosis Date Noted  . Rupture of right patellar tendon 12/31/2015  .  Fall during current hospitalization 12/30/2015 12/30/2015  . 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 Diverticulitis of colon 12/18/2015  . Primary localized osteoarthritis of right knee 12/17/2015  . Contact dermatitis due to chemicals   . DJD (degenerative joint disease) of knee 01/05/2015  . Primary localized osteoarthritis of left knee   . Osteoarthritis   . Schizoaffective disorder ([place])   . Diabetes mellitus without complication ([place])   . Hypertension   . HLD (hyperlipidemia)   . Asthma   . Pulmonary nodule   . Glaucoma   . Insomnia   . Well woman exam with routine gynecological exam 08/04/2014    Past Surgical History:  Procedure Laterality Date  . [place]  . IRRIGATION AND DEBRIDEMENT KNEE Right  12/30/2015   Procedure: IRRIGATION AND DEBRIDEMENT KNEE;  Surgeon: Elsie Saas, MD;  Location: [facility];  Service: Orthopedics;  Laterality: Right;  . PATELLAR TENDON REPAIR Right 12/30/2015   Procedure: PATELLA TENDON REPAIR;  Surgeon: Elsie Saas, MD;  Location: [place];  Service: Orthopedics;  Laterality: Right;  . TOTAL KNEE ARTHROPLASTY Left 01/05/2015   Procedure: TOTAL KNEE ARTHROPLASTY;  Surgeon: Elsie Saas, MD;  Location: [place];  Service: Orthopedics;  Laterality: Left;  . TOTAL KNEE ARTHROPLASTY Right 12/28/2015   Procedure: TOTAL KNEE ARTHROPLASTY;  Surgeon: Elsie Saas, MD;  Location: [place];  Service: Orthopedics;  Laterality: Right;     OB History    Gravida  3   Para  2   Term  2   Preterm      AB  1   Living  2     SAB      TAB  1   Ectopic      Multiple      Live Births              Family History  Problem Relation Age of Onset  . Heart attack Father   . Glaucoma Mother   . Diabetes Mellitus II Mother   . Hypertension Sister   . Cancer Maternal Grandmother   . Colon cancer Maternal Grandmother     Social History   Tobacco Use  . Smoking status: Former Research scientist (life sciences)  . Smokeless tobacco: Never Used  . Tobacco comment: quit smoking 1yrs ago  Substance Use Topics  . Alcohol use: No  . Drug use: No    Home Medications Prior to Admission medications   Medication Sig Start Date End Date Taking? Authorizing Provider  acetaminophen (TYLENOL) 500 MG tablet Take 500 mg by mouth at bedtime.    [provider]  albuterol (PROVENTIL HFA;VENTOLIN HFA) 108 (90 Base) MCG/ACT inhaler Inhale 2 puffs into the lungs every 6 (six) hours as needed for wheezing or shortness of breath. 05/05/18   Scot Jun, FNP  aspirin 81 MG chewable tablet Chew by mouth daily.    [provider]  atorvastatin (LIPITOR) 10 MG tablet Take 10 mg by mouth at bedtime.    [provider]  budesonide-formoterol (SYMBICORT) 80-4.5 MCG/ACT inhaler  Inhale 2 puffs into the lungs 2 (two) times daily. 05/05/18   Scot Jun, FNP  cetirizine (ZYRTEC) 10 MG tablet Take 1 tablet (10 mg total) by mouth daily. 05/05/18   Scot Jun, FNP  diclofenac sodium (VOLTAREN) 1 % GEL Apply topically 4 (four) times daily.    [provider]  hydrocortisone cream 1 % Apply to affected area 2 times daily 4/0/10   Delora Fuel,  MD  hydrOXYzine (ATARAX/VISTARIL) 25 MG tablet Take 0.5-1 tablets (12.5-25 mg total) by mouth every 8 (eight) hours as needed for itching. 12/12/18   Wallis Bamberg, PA-C  latanoprost (XALATAN) 0.005 % ophthalmic solution Place 1 drop into both eyes at bedtime.  07/31/14   [provider]  linaclotide Karlene Einstein) 145 MCG CAPS capsule Take 1 capsule (145 mcg total) by mouth daily before breakfast. Patient not taking: Reported on 03/26/2018 11/29/15   Schorr, Roma Kayser, NP  LORazepam (ATIVAN) 0.5 MG tablet Take 0.5 mg by mouth every 6 (six) hours as needed for anxiety.    [provider]  losartan-hydrochlorothiazide (HYZAAR) 100-12.5 MG tablet Take 0.5 tablets by mouth daily.  05/22/15   [provider]  metFORMIN (GLUCOPHAGE) 500 MG tablet Take 500 mg by mouth 2 (two) times daily with a meal.    [provider]  polyethylene glycol (MIRALAX / GLYCOLAX) packet 17grams in 6 oz of water twice a day until bowel movement.  LAXITIVE.  Restart if two days since last bowel movement Patient taking differently: daily as needed. 17grams in 6 oz of water twice a day until bowel movement.  LAXITIVE.  Restart if two days since last bowel movement 01/01/16   Shepperson, Kirstin, PA-C  pravastatin (PRAVACHOL) 80 MG tablet Take 80 mg by mouth daily.    [provider]  triamcinolone ointment (KENALOG) 0.1 %  02/13/18   [provider]  zolpidem (AMBIEN) 5 MG tablet Take 1 tablet (5 mg total) by mouth at bedtime. 01/04/16   Sharon Seller, NP    Allergies    Bactrim  [sulfamethoxazole-trimethoprim], Ivp dye [iodinated diagnostic agents], Clonazepam, Sulfa antibiotics, Codeine, Other, and Vicodin [hydrocodone-acetaminophen]  Review of Systems   Review of Systems  Cardiovascular: Positive for chest pain.  All other systems reviewed and are negative.   Physical Exam Updated Vital Signs BP (!) 145/93 (BP Location: Right Arm)   Pulse 97   Temp 98.3 F (36.8 C) (Oral)   Resp 18   SpO2 97%   Physical Exam Vitals and nursing note reviewed.  Constitutional:      General: She is not in acute distress.    Appearance: She is well-developed.  HENT:     Head: Atraumatic.  Eyes:     Conjunctiva/sclera: Conjunctivae normal.  Cardiovascular:     Rate and Rhythm: Rhythm irregular.     Heart sounds: Murmur present.  Pulmonary:     Breath sounds: Normal breath sounds.  Chest:     Chest wall: No tenderness.  Musculoskeletal:     Cervical back: Neck supple.     Right lower leg: No edema.     Left lower leg: No edema.  Skin:    Findings: No rash.  Neurological:     Mental Status: She is alert.     ED Results / Procedures / Treatments   Labs (all labs ordered are listed, but only abnormal results are displayed) Labs Reviewed  BASIC METABOLIC PANEL - Abnormal; Notable for the following components:      Result Value   CO2 21 (*)    Creatinine, Ser 1.11 (*)    GFR calc non Af Amer 50 (*)    GFR calc Af Amer 57 (*)    All other components within normal limits  CBC  TROPONIN I (HIGH SENSITIVITY)  TROPONIN I (HIGH SENSITIVITY)    EKG None  ED ECG REPORT   Date: 06/04/2019  Rate: 90  Rhythm: atrial fibrillation  QRS Axis: left  Intervals: normal  ST/T Wave abnormalities: nonspecific ST/T changes  Conduction Disutrbances:none  Narrative Interpretation:   Old EKG Reviewed: unchanged  I have personally reviewed the EKG tracing and agree with the computerized printout as noted.   Radiology DG Chest 2 View  Result Date:  06/04/2019 CLINICAL DATA:  Chest pain and shortness of breath for 3 days. EXAM: CHEST - 2 VIEW COMPARISON:  PA and lateral chest 02/23/2015. FINDINGS: The heart size and mediastinal contours are within normal limits. Atherosclerosis is noted. Both lungs are clear. The visualized skeletal structures are unremarkable. IMPRESSION: No acute disease. Atherosclerosis. Electronically Signed   By: Drusilla Kannerhomas  Dalessio M.D.   On: 06/04/2019 18:00    Procedures Procedures (including critical care time)  Medications Ordered in ED Medications - No data to display  ED Course  I have reviewed the triage vital signs and the nursing notes.  Pertinent labs & imaging results that were available during my care of the patient were reviewed by me and considered in my medical decision making (see chart for details).    MDM Rules/Calculators/A&P                      BP (!) 145/93 (BP Location: Right Arm)   Pulse 97   Temp 98.3 F (36.8 C) (Oral)   Resp 18   SpO2 97%   Final Clinical Impression(s) / ED Diagnoses Final diagnoses:  Chest pain, unspecified type    Rx / DC Orders ED Discharge Orders    None     6:39 PM Patient here with recurrent chest pain suggestive of unstable angina.  Will need to be admitted for cardiac work-up.  Currently chest pain-free.  7:18 PM Per ED tech and secretary patient states she needed to leave for a family emergency.  Patient left AMA before we can communicate with her.  Will attempt to reach out to pt as it is important for her to be evaluated for her cardiac complaint   Fayrene Helperran, Davonda Ausley, Cordelia Poche-C 06/04/19 Harlow [id number]    Pfeiffer, Marcy, MD 06/05/19 [phone]
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
## 2019-06-05 ENCOUNTER — Encounter (HOSPITAL_COMMUNITY): Payer: Self-pay | Admitting: Emergency Medicine

## 2019-06-05 ENCOUNTER — Other Ambulatory Visit: Payer: Self-pay

## 2019-06-05 ENCOUNTER — Inpatient Hospital Stay (HOSPITAL_COMMUNITY)
Admission: EM | Admit: 2019-06-05 | Discharge: 2019-06-08 | DRG: 309 | Disposition: A | Discharge status: Home / Self Care | Payer: Medicare Other | Attending: Internal Medicine | Admitting: Internal Medicine

## 2019-06-05 DIAGNOSIS — Y92009 Unspecified place in unspecified non-institutional (private) residence as the place of occurrence of the external cause: Secondary | ICD-10-CM

## 2019-06-05 DIAGNOSIS — Z833 Family history of diabetes mellitus: Secondary | ICD-10-CM

## 2019-06-05 DIAGNOSIS — N183 Chronic kidney disease, stage 3 unspecified: Secondary | ICD-10-CM | POA: Diagnosis present

## 2019-06-05 DIAGNOSIS — F419 Anxiety disorder, unspecified: Secondary | ICD-10-CM | POA: Diagnosis present

## 2019-06-05 DIAGNOSIS — Z87891 Personal history of nicotine dependence: Secondary | ICD-10-CM

## 2019-06-05 DIAGNOSIS — Z79899 Other long term (current) drug therapy: Secondary | ICD-10-CM

## 2019-06-05 DIAGNOSIS — R079 Chest pain, unspecified: Secondary | ICD-10-CM | POA: Diagnosis present

## 2019-06-05 DIAGNOSIS — E1122 Type 2 diabetes mellitus with diabetic chronic kidney disease: Secondary | ICD-10-CM | POA: Diagnosis present

## 2019-06-05 DIAGNOSIS — Z96653 Presence of artificial knee joint, bilateral: Secondary | ICD-10-CM | POA: Diagnosis present

## 2019-06-05 DIAGNOSIS — E785 Hyperlipidemia, unspecified: Secondary | ICD-10-CM | POA: Diagnosis present

## 2019-06-05 DIAGNOSIS — Z882 Allergy status to sulfonamides status: Secondary | ICD-10-CM

## 2019-06-05 DIAGNOSIS — Z7951 Long term (current) use of inhaled steroids: Secondary | ICD-10-CM

## 2019-06-05 DIAGNOSIS — Z83511 Family history of glaucoma: Secondary | ICD-10-CM

## 2019-06-05 DIAGNOSIS — I129 Hypertensive chronic kidney disease with stage 1 through stage 4 chronic kidney disease, or unspecified chronic kidney disease: Secondary | ICD-10-CM | POA: Diagnosis present

## 2019-06-05 DIAGNOSIS — I4891 Unspecified atrial fibrillation: Secondary | ICD-10-CM | POA: Diagnosis present

## 2019-06-05 DIAGNOSIS — M549 Dorsalgia, unspecified: Secondary | ICD-10-CM | POA: Diagnosis present

## 2019-06-05 DIAGNOSIS — I10 Essential (primary) hypertension: Secondary | ICD-10-CM | POA: Diagnosis present

## 2019-06-05 DIAGNOSIS — Z91041 Radiographic dye allergy status: Secondary | ICD-10-CM

## 2019-06-05 DIAGNOSIS — Z888 Allergy status to other drugs, medicaments and biological substances status: Secondary | ICD-10-CM

## 2019-06-05 DIAGNOSIS — Z7901 Long term (current) use of anticoagulants: Secondary | ICD-10-CM

## 2019-06-05 DIAGNOSIS — H409 Unspecified glaucoma: Secondary | ICD-10-CM | POA: Diagnosis present

## 2019-06-05 DIAGNOSIS — T45516A Underdosing of anticoagulants, initial encounter: Secondary | ICD-10-CM | POA: Diagnosis present

## 2019-06-05 DIAGNOSIS — Z800 Family history of malignant neoplasm of digestive organs: Secondary | ICD-10-CM

## 2019-06-05 DIAGNOSIS — K219 Gastro-esophageal reflux disease without esophagitis: Secondary | ICD-10-CM | POA: Diagnosis present

## 2019-06-05 DIAGNOSIS — Z20828 Contact with and (suspected) exposure to other viral communicable diseases: Secondary | ICD-10-CM | POA: Diagnosis present

## 2019-06-05 DIAGNOSIS — N179 Acute kidney failure, unspecified: Secondary | ICD-10-CM | POA: Diagnosis present

## 2019-06-05 DIAGNOSIS — I4892 Unspecified atrial flutter: Principal | ICD-10-CM | POA: Diagnosis present

## 2019-06-05 DIAGNOSIS — E119 Type 2 diabetes mellitus without complications: Secondary | ICD-10-CM

## 2019-06-05 DIAGNOSIS — G4700 Insomnia, unspecified: Secondary | ICD-10-CM | POA: Diagnosis present

## 2019-06-05 DIAGNOSIS — Z8249 Family history of ischemic heart disease and other diseases of the circulatory system: Secondary | ICD-10-CM

## 2019-06-05 DIAGNOSIS — I088 Other rheumatic multiple valve diseases: Secondary | ICD-10-CM | POA: Diagnosis present

## 2019-06-05 DIAGNOSIS — Z794 Long term (current) use of insulin: Secondary | ICD-10-CM

## 2019-06-05 DIAGNOSIS — G8929 Other chronic pain: Secondary | ICD-10-CM | POA: Diagnosis present

## 2019-06-05 DIAGNOSIS — Z885 Allergy status to narcotic agent status: Secondary | ICD-10-CM

## 2019-06-05 DIAGNOSIS — I4819 Other persistent atrial fibrillation: Secondary | ICD-10-CM | POA: Diagnosis present

## 2019-06-05 DIAGNOSIS — J45909 Unspecified asthma, uncomplicated: Secondary | ICD-10-CM | POA: Diagnosis present

## 2019-06-05 LAB — TROPONIN I (HIGH SENSITIVITY)
Troponin I (High Sensitivity): 3 ng/L (ref <18)
Troponin I (High Sensitivity): 3 ng/L (ref <18)

## 2019-06-05 MED ORDER — SODIUM CHLORIDE 0.9% FLUSH: 3.0000 mL | Freq: Once | INTRAVENOUS | Status: DC

## 2019-06-05 NOTE — ED Notes (Signed)
Pt complaining of increased CP, EKG ordered

## 2019-06-05 NOTE — ED Notes (Signed)
Pt had labs and xray last night
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
## 2019-06-06 ENCOUNTER — Encounter (HOSPITAL_COMMUNITY): Payer: Self-pay | Admitting: Emergency Medicine

## 2019-06-06 ENCOUNTER — Other Ambulatory Visit: Payer: Self-pay | Admitting: Physician Assistant

## 2019-06-06 DIAGNOSIS — I4819 Other persistent atrial fibrillation: Secondary | ICD-10-CM | POA: Diagnosis present

## 2019-06-06 DIAGNOSIS — Z888 Allergy status to other drugs, medicaments and biological substances status: Secondary | ICD-10-CM | POA: Diagnosis not present

## 2019-06-06 DIAGNOSIS — I4891 Unspecified atrial fibrillation: Secondary | ICD-10-CM | POA: Diagnosis present

## 2019-06-06 DIAGNOSIS — F419 Anxiety disorder, unspecified: Secondary | ICD-10-CM | POA: Diagnosis present

## 2019-06-06 DIAGNOSIS — E785 Hyperlipidemia, unspecified: Secondary | ICD-10-CM | POA: Diagnosis present

## 2019-06-06 DIAGNOSIS — Z8249 Family history of ischemic heart disease and other diseases of the circulatory system: Secondary | ICD-10-CM | POA: Diagnosis not present

## 2019-06-06 DIAGNOSIS — Z91041 Radiographic dye allergy status: Secondary | ICD-10-CM | POA: Diagnosis not present

## 2019-06-06 DIAGNOSIS — N183 Chronic kidney disease, stage 3 unspecified: Secondary | ICD-10-CM | POA: Diagnosis present

## 2019-06-06 DIAGNOSIS — R079 Chest pain, unspecified: Secondary | ICD-10-CM

## 2019-06-06 DIAGNOSIS — I10 Essential (primary) hypertension: Secondary | ICD-10-CM | POA: Diagnosis not present

## 2019-06-06 DIAGNOSIS — Z794 Long term (current) use of insulin: Secondary | ICD-10-CM

## 2019-06-06 DIAGNOSIS — E1121 Type 2 diabetes mellitus with diabetic nephropathy: Secondary | ICD-10-CM

## 2019-06-06 DIAGNOSIS — Z96653 Presence of artificial knee joint, bilateral: Secondary | ICD-10-CM | POA: Diagnosis not present

## 2019-06-06 DIAGNOSIS — H409 Unspecified glaucoma: Secondary | ICD-10-CM | POA: Diagnosis not present

## 2019-06-06 DIAGNOSIS — I4892 Unspecified atrial flutter: Secondary | ICD-10-CM | POA: Diagnosis present

## 2019-06-06 DIAGNOSIS — J4520 Mild intermittent asthma, uncomplicated: Secondary | ICD-10-CM

## 2019-06-06 DIAGNOSIS — G8929 Other chronic pain: Secondary | ICD-10-CM | POA: Diagnosis present

## 2019-06-06 DIAGNOSIS — N179 Acute kidney failure, unspecified: Secondary | ICD-10-CM | POA: Diagnosis present

## 2019-06-06 DIAGNOSIS — I480 Paroxysmal atrial fibrillation: Secondary | ICD-10-CM

## 2019-06-06 DIAGNOSIS — Z87891 Personal history of nicotine dependence: Secondary | ICD-10-CM | POA: Diagnosis not present

## 2019-06-06 DIAGNOSIS — Z7901 Long term (current) use of anticoagulants: Secondary | ICD-10-CM | POA: Diagnosis not present

## 2019-06-06 DIAGNOSIS — Y92009 Unspecified place in unspecified non-institutional (private) residence as the place of occurrence of the external cause: Secondary | ICD-10-CM | POA: Diagnosis not present

## 2019-06-06 DIAGNOSIS — N1831 Chronic kidney disease, stage 3a: Secondary | ICD-10-CM

## 2019-06-06 DIAGNOSIS — M549 Dorsalgia, unspecified: Secondary | ICD-10-CM | POA: Diagnosis not present

## 2019-06-06 DIAGNOSIS — G4700 Insomnia, unspecified: Secondary | ICD-10-CM | POA: Diagnosis present

## 2019-06-06 DIAGNOSIS — Z20828 Contact with and (suspected) exposure to other viral communicable diseases: Secondary | ICD-10-CM | POA: Diagnosis present

## 2019-06-06 DIAGNOSIS — F5101 Primary insomnia: Secondary | ICD-10-CM

## 2019-06-06 DIAGNOSIS — J45909 Unspecified asthma, uncomplicated: Secondary | ICD-10-CM | POA: Diagnosis present

## 2019-06-06 DIAGNOSIS — E1122 Type 2 diabetes mellitus with diabetic chronic kidney disease: Secondary | ICD-10-CM | POA: Diagnosis present

## 2019-06-06 DIAGNOSIS — K219 Gastro-esophageal reflux disease without esophagitis: Secondary | ICD-10-CM | POA: Diagnosis not present

## 2019-06-06 DIAGNOSIS — Z882 Allergy status to sulfonamides status: Secondary | ICD-10-CM | POA: Diagnosis not present

## 2019-06-06 DIAGNOSIS — I129 Hypertensive chronic kidney disease with stage 1 through stage 4 chronic kidney disease, or unspecified chronic kidney disease: Secondary | ICD-10-CM | POA: Diagnosis present

## 2019-06-06 DIAGNOSIS — Z885 Allergy status to narcotic agent status: Secondary | ICD-10-CM | POA: Diagnosis not present

## 2019-06-06 LAB — CBC WITH DIFFERENTIAL/PLATELET
Abs Immature Granulocytes: 0.01 10*3/uL (ref 0.00–0.07)
Basophils Absolute: 0 10*3/uL (ref 0.0–0.1)
Basophils Relative: 0 %
Eosinophils Absolute: 0.2 10*3/uL (ref 0.0–0.5)
Eosinophils Relative: 2 %
HCT: 39.6 % (ref 36.0–46.0)
Hemoglobin: 13 g/dL (ref 12.0–15.0)
Immature Granulocytes: 0 %
Lymphocytes Relative: 44 %
Lymphs Abs: 2.8 10*3/uL (ref 0.7–4.0)
MCH: 26.3 pg (ref 26.0–34.0)
MCHC: 32.8 g/dL (ref 30.0–36.0)
MCV: 80.2 fL (ref 80.0–100.0)
Monocytes Absolute: 0.8 10*3/uL (ref 0.1–1.0)
Monocytes Relative: 13 %
Neutro Abs: 2.6 10*3/uL (ref 1.7–7.7)
Neutrophils Relative %: 41 %
Platelets: 294 10*3/uL (ref 150–400)
RBC: 4.94 MIL/uL (ref 3.87–5.11)
RDW: 13.7 % (ref 11.5–15.5)
WBC: 6.4 10*3/uL (ref 4.0–10.5)
nRBC: 0 % (ref 0.0–0.2)

## 2019-06-06 LAB — BASIC METABOLIC PANEL
Anion gap: 14 (ref 5–15)
BUN: 19 mg/dL (ref 8–23)
CO2: 21 mmol/L — ABNORMAL LOW (ref 22–32)
Calcium: 9.6 mg/dL (ref 8.9–10.3)
Chloride: 104 mmol/L (ref 98–111)
Creatinine, Ser: 1.44 mg/dL — ABNORMAL HIGH (ref 0.44–1.00)
GFR calc Af Amer: 42 mL/min — ABNORMAL LOW (ref >60)
GFR calc non Af Amer: 36 mL/min — ABNORMAL LOW (ref >60)
Glucose, Bld: 152 mg/dL — ABNORMAL HIGH (ref 70–99)
Potassium: 3.9 mmol/L (ref 3.5–5.1)
Sodium: 139 mmol/L (ref 135–145)

## 2019-06-06 LAB — GLUCOSE, CAPILLARY
Glucose-Capillary: 105 mg/dL — ABNORMAL HIGH (ref 70–99)
Glucose-Capillary: 97 mg/dL (ref 70–99)

## 2019-06-06 LAB — CBG MONITORING, ED: Glucose-Capillary: 87 mg/dL (ref 70–99)

## 2019-06-06 LAB — SARS CORONAVIRUS 2 (TAT 6-24 HRS): SARS Coronavirus 2: NEGATIVE

## 2019-06-06 LAB — TROPONIN I (HIGH SENSITIVITY): Troponin I (High Sensitivity): 3 ng/L (ref <18)

## 2019-06-06 LAB — HEMOGLOBIN A1C
Hgb A1c MFr Bld: 6.1 % — ABNORMAL HIGH (ref 4.8–5.6)
Mean Plasma Glucose: 128.37 mg/dL

## 2019-06-06 LAB — MAGNESIUM: Magnesium: 2 mg/dL (ref 1.7–2.4)

## 2019-06-06 MED ORDER — SODIUM CHLORIDE 0.9 % IV SOLN: INTRAVENOUS | Status: DC

## 2019-06-06 MED ORDER — MORPHINE SULFATE (PF) 2 MG/ML IV SOLN: 2.0000 mg | INTRAVENOUS | Status: DC | PRN

## 2019-06-06 MED ORDER — ACETAMINOPHEN 500 MG PO TABS
1000.0000 mg | ORAL_TABLET | Freq: Every day | ORAL | Status: DC
  Administered 2019-06-06 – 2019-06-07 (×2): 1000 mg via ORAL
  Filled 2019-06-06 (×2): qty 2

## 2019-06-06 MED ORDER — PRAVASTATIN SODIUM 40 MG PO TABS
80.0000 mg | ORAL_TABLET | Freq: Every day | ORAL | Status: DC
  Administered 2019-06-06 – 2019-06-07 (×2): 80 mg via ORAL
  Filled 2019-06-06 (×2): qty 2

## 2019-06-06 MED ORDER — APIXABAN 5 MG PO TABS
5.0000 mg | ORAL_TABLET | Freq: Two times a day (BID) | ORAL | Status: DC
  Administered 2019-06-06 – 2019-06-08 (×4): 5 mg via ORAL
  Filled 2019-06-06 (×5): qty 1

## 2019-06-06 MED ORDER — MOMETASONE FURO-FORMOTEROL FUM 100-5 MCG/ACT IN AERO
2.0000 | INHALATION_SPRAY | Freq: Two times a day (BID) | RESPIRATORY_TRACT | Status: DC
  Administered 2019-06-07 (×2): 2 via RESPIRATORY_TRACT
  Filled 2019-06-06 (×2): qty 8.8

## 2019-06-06 MED ORDER — ALBUTEROL SULFATE (2.5 MG/3ML) 0.083% IN NEBU: 2.5000 mg | INHALATION_SOLUTION | Freq: Four times a day (QID) | RESPIRATORY_TRACT | Status: DC | PRN

## 2019-06-06 MED ORDER — ONDANSETRON HCL 4 MG PO TABS: 4.0000 mg | ORAL_TABLET | Freq: Four times a day (QID) | ORAL | Status: DC | PRN

## 2019-06-06 MED ORDER — METOPROLOL SUCCINATE ER 25 MG PO TB24
25.0000 mg | ORAL_TABLET | Freq: Every day | ORAL | Status: DC
  Administered 2019-06-07 – 2019-06-08 (×2): 25 mg via ORAL
  Filled 2019-06-06 (×2): qty 1

## 2019-06-06 MED ORDER — SODIUM CHLORIDE 0.9 % IV BOLUS
500.0000 mL | Freq: Once | INTRAVENOUS | Status: AC
  Administered 2019-06-06: 500 mL via INTRAVENOUS

## 2019-06-06 MED ORDER — INSULIN ASPART 100 UNIT/ML ~~LOC~~ SOLN: 0.0000 [IU] | Freq: Three times a day (TID) | SUBCUTANEOUS | Status: DC

## 2019-06-06 MED ORDER — ONDANSETRON HCL 4 MG/2ML IJ SOLN: 4.0000 mg | Freq: Four times a day (QID) | INTRAMUSCULAR | Status: DC | PRN

## 2019-06-06 MED ORDER — INSULIN ASPART 100 UNIT/ML ~~LOC~~ SOLN: 0.0000 [IU] | Freq: Every day | SUBCUTANEOUS | Status: DC

## 2019-06-06 MED ORDER — ZOLPIDEM TARTRATE 5 MG PO TABS
5.0000 mg | ORAL_TABLET | Freq: Every evening | ORAL | Status: DC | PRN
  Filled 2019-06-06: qty 1

## 2019-06-06 MED ORDER — NITROGLYCERIN 0.4 MG SL SUBL: 0.4000 mg | SUBLINGUAL_TABLET | SUBLINGUAL | Status: DC | PRN

## 2019-06-06 NOTE — Progress Notes (Signed)
Patient received from ED, extremely anxious, appears manic, noncompliant with admission procedures, RN assessment, attaching to monitoring equipment. Patient not staying in room, roaming the hallway "talking to lawyer", approaching and attempting to enter nursing station.  Tried to leave unit to go to ER to locate missing papers.  I called ED in attempt to locate same but they had no information.  Security called to take report of missing papers.  About the same time security arrived she located papers ("bills"). C/O pain at peripheral IV site at right [facility]; area bruised.  Removed at patient request, IV team order placed for new access.  States she must use name brand ambien or "my throat will close up".  Night RN informed.
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
## 2019-06-06 NOTE — ED Notes (Signed)
Pt was moved out into the hallway when she got a room upstairs. Pt is upset that she is out in the hallway because she called her insurance company and they told her that she was still paying for the room that she is in. It was explained to the patient that she is still being treated and monitored, and we are just waiting for the RN to call report on the floor. Pt is still very upset and keeps approaching this tech saying that she should of gotten a couple hours warning before being moved out of her room into the hallway and wants a reimbursement, pt stated that she doesn't need to be paying for the pt currently in "her" room. Pt is currently on the phone with her lawyer. Charge, Deneise Lever, aware of the situation.
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
## 2019-06-06 NOTE — Progress Notes (Addendum)
Pt refused Dulera tonight d/t she states she has not slept in 2 days in the ED. Pt extremely agitated at this time about her treatment schedules. Pt complaining to RT about her treatment and experience here at [facility]. RT reassured pt and attempted to calm her down. Pt doing much better at this time. Pt just states she is very frustrated. RT will continue to monitor.

## 2019-06-06 NOTE — ED Notes (Signed)
Pt extremely agitated about having to be moved to the hallway. Pt verbally aggressive with staff and telling NT that she has "taken pictures." Pt says she has spoken to her insurance company and they told her that we are not allowed to pull her out into the hallway like this, patient threatening lawsuit. This RN explained to the patient that sometimes it is necessary to pull patients into the hallway beds to make room for other patient's who are medically unstable and need to be put into a room to make use of the equipment in the room. Charge RN made aware.

## 2019-06-06 NOTE — ED Notes (Signed)
Pt states she is currently on the phone with her attorney. Is complaining about the noise coming from the tele monitor. Is also continuously walking back and forth from H015 to the nurses station.

## 2019-06-06 NOTE — ED Provider Notes (Addendum)
[facility] EMERGENCY DEPARTMENT Provider Note   CSN: [id number] Arrival date & time: 06/05/19  1817     History Chief Complaint  Patient presents with  . Chest Pain    Brooke Davidson is a 73 y.o. female with past medical history significant for recent diagnosis of a fib on metoprolol, diabetes on insulin, hypertension, hyperlipidemia presents to emergency department today with chief complaint of chest pain. She was seen for the same on 06/04/2019 and was recommended for admission for cardiac work-up however left AMA due to family emergency. She states since leaving she has continued to have chest intermittent chest pain. She states the pain is located in the center of her chest. It feels like pressure and she rates it 5/10 in severity. She took nitro yesterday and it resolved chest pain. She is also endorsing associated dyspnea on exertion, bilateral lower extremity edema. Denies fever, chills, radiation to left/right arm, jaw or back, nausea, or diaphoresis. PCP discussed case with her cardiologist who recommended ED evaluation for cardiac work up and stress test.   Chart review shows she had an echo 05/02/19 at [facility] with LV EF 55-65% and mild to moderate regurgitation in mitral valve, tricuspid valve, pulmonic valve. Her cardiologist is Dr. Willeen Cass. She is anticoagulated on eliquis, reports compliance.    Past Medical History:  Diagnosis Date  . Anemia    in the past  . Asthma    uses Symbicort daily and ALbuterol as needed  . Chronic back pain   . Complication of anesthesia    slow to wake up  . Constipation   . Contact dermatitis due to chemicals   . Cough    nonproductive and without fever  . Depression    but doesn't take any meds   . Diabetes mellitus without complication (HCC)    takes Metformin daily  . Eczema   . Fall during current hospitalization 12/30/2015 12/30/2015  . GERD (gastroesophageal reflux disease)    doesn't take any meds  .  Glaucoma    uses eye drops daily  . Heart murmur    states it was told to her as a younger woman, no problems that she knows   . History of bronchitis a yr ago  . HLD (hyperlipidemia)    takes Pravastatin daily  . Hypertension    takes Losartan-HCTZ daily  . Insomnia    takes Ambien nightly   . Joint pain   . Joint swelling   . Muscle spasm    takes Baclofen daily as needed  . Osteoarthritis    knees  . Primary localized osteoarthritis of left knee   . Primary localized osteoarthritis of right knee 12/17/2015  . Pulmonary nodule   . Rupture of right patellar tendon 12/31/2015   Patient fell the second evening after total knee replacement and suffer and open patella tendon rupture of her right knee.  Surgically repaired after an extensive irrigation and debridement on 12/30/2015  . 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 Urinary frequency   . Urinary urgency     Patient Active Problem List   Diagnosis Date Noted  . Nonspecific chest pain   . Persistent atrial fibrillation (HCC)   . Rupture of right patellar tendon 12/31/2015  . Fall during current hospitalization 12/30/2015 12/30/2015  . 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 Diverticulitis of colon 12/18/2015  . Primary localized osteoarthritis of right knee 12/17/2015  . Contact dermatitis due  to chemicals   . DJD (degenerative joint disease) of knee 01/05/2015  . Primary localized osteoarthritis of left knee   . Osteoarthritis   . Schizoaffective disorder ([place])   . Diabetes mellitus without complication ([place])   . Hypertension   . HLD (hyperlipidemia)   . Asthma   . Pulmonary nodule   . Glaucoma   . Insomnia   . Well woman exam with routine gynecological exam 08/04/2014    Past Surgical History:  Procedure Laterality Date  . [place]  . IRRIGATION AND DEBRIDEMENT KNEE Right 12/30/2015   Procedure: IRRIGATION AND DEBRIDEMENT KNEE;  Surgeon: Elsie Saas, MD;   Location: [place];  Service: Orthopedics;  Laterality: Right;  . PATELLAR TENDON REPAIR Right 12/30/2015   Procedure: PATELLA TENDON REPAIR;  Surgeon: Elsie Saas, MD;  Location: [place];  Service: Orthopedics;  Laterality: Right;  . TOTAL KNEE ARTHROPLASTY Left 01/05/2015   Procedure: TOTAL KNEE ARTHROPLASTY;  Surgeon: Elsie Saas, MD;  Location: [place];  Service: Orthopedics;  Laterality: Left;  . TOTAL KNEE ARTHROPLASTY Right 12/28/2015   Procedure: TOTAL KNEE ARTHROPLASTY;  Surgeon: Elsie Saas, MD;  Location: [place];  Service: Orthopedics;  Laterality: Right;     OB History    Gravida  3   Para  2   Term  2   Preterm      AB  1   Living  2     SAB      TAB  1   Ectopic      Multiple      Live Births              Family History  Problem Relation Age of Onset  . Heart attack Father   . Glaucoma Mother   . Diabetes Mellitus II Mother   . Hypertension Sister   . Cancer Maternal Grandmother   . Colon cancer Maternal Grandmother     Social History   Tobacco Use  . Smoking status: Former Research scientist (life sciences)  . Smokeless tobacco: Never Used  . Tobacco comment: quit smoking 85yrs ago  Substance Use Topics  . Alcohol use: No  . Drug use: No    Home Medications Prior to Admission medications   Medication Sig Start Date End Date Taking? Authorizing Provider  acetaminophen (TYLENOL) 500 MG tablet Take 500 mg by mouth at bedtime.    [provider]  albuterol (PROVENTIL HFA;VENTOLIN HFA) 108 (90 Base) MCG/ACT inhaler Inhale 2 puffs into the lungs every 6 (six) hours as needed for wheezing or shortness of breath. 05/05/18   Scot Jun, FNP  aspirin 81 MG chewable tablet Chew by mouth daily.    [provider]  atorvastatin (LIPITOR) 10 MG tablet Take 10 mg by mouth at bedtime.    [provider]  budesonide-formoterol (SYMBICORT) 80-4.5 MCG/ACT inhaler Inhale 2 puffs into the lungs 2 (two) times daily. 05/05/18   Scot Jun, FNP   cetirizine (ZYRTEC) 10 MG tablet Take 1 tablet (10 mg total) by mouth daily. 05/05/18   Scot Jun, FNP  diclofenac sodium (VOLTAREN) 1 % GEL Apply topically 4 (four) times daily.    [provider]  hydrocortisone cream 1 % Apply to affected area 2 times daily 6/0/45   Delora Fuel, MD  hydrOXYzine (ATARAX/VISTARIL) 25 MG tablet Take 0.5-1 tablets (12.5-25 mg total) by mouth every 8 (eight) hours as needed for itching. 12/12/18   Jaynee Eagles, PA-C  latanoprost (XALATAN) 0.005 % ophthalmic solution  Place 1 drop into both eyes at bedtime.  07/31/14   [provider]  linaclotide Karlene Einstein(LINZESS) 145 MCG CAPS capsule Take 1 capsule (145 mcg total) by mouth daily before breakfast. Patient not taking: Reported on 03/26/2018 11/29/15   Schorr, Roma KayserKatherine P, NP  LORazepam (ATIVAN) 0.5 MG tablet Take 0.5 mg by mouth every 6 (six) hours as needed for anxiety.    [provider]  losartan-hydrochlorothiazide (HYZAAR) 100-12.5 MG tablet Take 0.5 tablets by mouth daily.  05/22/15   [provider]  metFORMIN (GLUCOPHAGE) 500 MG tablet Take 500 mg by mouth 2 (two) times daily with a meal.    [provider]  polyethylene glycol (MIRALAX / GLYCOLAX) packet 17grams in 6 oz of water twice a day until bowel movement.  LAXITIVE.  Restart if two days since last bowel movement Patient taking differently: daily as needed. 17grams in 6 oz of water twice a day until bowel movement.  LAXITIVE.  Restart if two days since last bowel movement 01/01/16   Shepperson, Kirstin, PA-C  pravastatin (PRAVACHOL) 80 MG tablet Take 80 mg by mouth daily.    [provider]  triamcinolone ointment (KENALOG) 0.1 %  02/13/18   [provider]  zolpidem (AMBIEN) 5 MG tablet Take 1 tablet (5 mg total) by mouth at bedtime. 01/04/16   Sharon SellerEubanks, Jessica K, NP    Allergies    Bactrim [sulfamethoxazole-trimethoprim], Ivp dye [iodinated diagnostic agents], Clonazepam, Sulfa antibiotics,  Codeine, Other, and Vicodin [hydrocodone-acetaminophen]  Review of Systems   Review of Systems All other systems are reviewed and are negative for acute change except as noted in the HPI.  Physical Exam Updated Vital Signs BP (!) 155/95   Pulse (!) 102   Temp 98.4 F (36.9 C) (Oral)   Resp 18   SpO2 100%   Physical Exam Vitals and nursing note reviewed.  Constitutional:      General: She is not in acute distress.    Appearance: She is not ill-appearing.  HENT:     Head: Normocephalic and atraumatic.     Right Ear: Tympanic membrane and external ear normal.     Left Ear: Tympanic membrane and external ear normal.     Nose: Nose normal.     Mouth/Throat:     Mouth: Mucous membranes are moist.     Pharynx: Oropharynx is clear.  Eyes:     General: No scleral icterus.       Right eye: No discharge.        Left eye: No discharge.     Extraocular Movements: Extraocular movements intact.     Conjunctiva/sclera: Conjunctivae normal.     Pupils: Pupils are equal, round, and reactive to light.  Neck:     Vascular: No JVD.  Cardiovascular:     Rate and Rhythm: Tachycardia present. Rhythm irregular.     Pulses: Normal pulses.          Radial pulses are 2+ on the right side and 2+ on the left side.     Heart sounds: Murmur present.     Comments: tachycardic to 102 Pulmonary:     Comments: Lungs clear to auscultation in all fields. Symmetric chest rise. No wheezing, rales, or rhonchi. Abdominal:     Comments: Abdomen is soft, non-distended, and non-tender in all quadrants. No rigidity, no guarding. No peritoneal signs.  Musculoskeletal:        General: Normal range of motion.     Cervical back: Normal range of  motion.     Right lower leg: No edema.     Left lower leg: No edema.  Skin:    General: Skin is warm and dry.     Capillary Refill: Capillary refill takes less than 2 seconds.  Neurological:     Mental Status: She is oriented to person, place, and time.     GCS: GCS  eye subscore is 4. GCS verbal subscore is 5. GCS motor subscore is 6.     Comments: Fluent speech, no facial droop.  Psychiatric:        Behavior: Behavior normal.     ED Results / Procedures / Treatments   Labs (all labs ordered are listed, but only abnormal results are displayed) Labs Reviewed  BASIC METABOLIC PANEL - Abnormal; Notable for the following components:      Result Value   CO2 21 (*)    Glucose, Bld 152 (*)    Creatinine, Ser 1.44 (*)    GFR calc non Af Amer 36 (*)    GFR calc Af Amer 42 (*)    All other components within normal limits  SARS CORONAVIRUS 2 (TAT 6-24 HRS)  CBC WITH DIFFERENTIAL/PLATELET  CBG MONITORING, ED  TROPONIN I (HIGH SENSITIVITY)  TROPONIN I (HIGH SENSITIVITY)  TROPONIN I (HIGH SENSITIVITY)    EKG EKG Interpretation  Date/Time:  Wednesday June 05 2019 23:24:23 EST Ventricular Rate:  97 PR Interval:    QRS Duration: 68 QT Interval:  354 QTC Calculation: 449 R Axis:   -33 Text Interpretation: Atrial fibrillation Left axis deviation Nonspecific ST and T wave abnormality Abnormal ECG No STEMI Confirmed by Brooke Davidson [id number]) on 06/06/2019 7:15:44 AM   Radiology DG Chest 2 View  Result Date: 06/04/2019 CLINICAL DATA:  Chest pain and shortness of breath for 3 days. EXAM: CHEST - 2 VIEW COMPARISON:  PA and lateral chest 02/23/2015. FINDINGS: The heart size and mediastinal contours are within normal limits. Atherosclerosis is noted. Both lungs are clear. The visualized skeletal structures are unremarkable. IMPRESSION: No acute disease. Atherosclerosis. Electronically Signed   By: Drusilla Kanner M.D.   On: 06/04/2019 18:00    Procedures Procedures (including critical care time)  Medications Ordered in ED Medications  sodium chloride flush (NS) 0.9 % injection 3 mL (has no administration in time range)  0.9 %  sodium chloride infusion ( Intravenous New Bag/Given 06/06/19 0942)  sodium chloride 0.9 % bolus 500 mL (0 mLs  Intravenous Stopped 06/06/19 1125)    ED Course  I have reviewed the triage vital signs and the nursing notes.  Pertinent labs & imaging results that were available during my care of the patient were reviewed by me and considered in my medical decision making (see chart for details).  Clinical Course as of Jun 05 1224  Thu Jun 06, 2019  6578 First troponin is 3  Troponin I (High Sensitivity): 3 [KA]  [phone] This is a 73 year old female with a history of hypertension, diabetes, newly diagnosed A. fib, on Eliquis and metoprolol, presenting to the emergency department with episodes of chest pain.  Patient ports for the past 2 to 3 days she has been having episodes of pressure in her epigastrium and the left side of her chest.  She says it feels like a small child is sitting on her chest.  She said these episodes come and go, most recently occurred last night, currently she is asymptomatic.  She has never had episode like this before.  She spoke to  her cardiologist who recommended that she come to the ED for emergent evaluation and a possible stress test.  Of note she had a recent echocardiogram, please see PA provider's note, which showed a mildly reduced EF, no signs of thrombus.  On exam the patient is well-appearing.  She is mildly tachycardic.  She does have some pitting edema of the lower extremities which is symmetrical.  She has a mild systolic murmur.  She is not in any respiratory distress.  Plan to follow-up on the remainder of her basic labs, and anticipate admission for chest pain evaluation.   [MT]  0907 Creatinine is elevated compared to x2 days ago when it was 1.11. Will give small fluid bolus.  Creatinine(!): 1.44 [KA]  0907 No leukocytosis, no anemia  CBC with Differential [KA]  0907 No severe electrolyte derangements, normal anion gap. GFR 42  Basic metabolic panel(!) [KA]  [id number] Cardiology was able to see the patient emergency department.  Brooke Davidson recommended the patient to  follow-up with cardiologist as an outpatient.  Given that we were able to obtain the specialist consultation in the ED, and I now believe it is reasonable discharge patient home.   [MT]  1120 Further recommendations given by the cardiology team.  Would not recommend that the patient be admitted to the hospital for stress testing.  Therefore concerned that the patient is in fact having chest pain with exertion, which they had not been aware of previously on their last recommendations.   [MT]  1225 Delta troponin is unchanged, both 3.   [KA]    Clinical Course User Index [KA] Brooke Davidson, Brooke Hamman, PA-C [MT] Brooke Davidson, Kermit Balo, MD   MDM Rules/Calculators/A&P HEAR Score: 4                     Patient seen and examined. She had cardiac work up x 2 days ago before leaving AMA. Admission was recommended for unstable angina. I viewed her previous work up which included creatinine consistent with baseline, no leukocytosis, no anemia, no severe electrolyte derangement. Only one troponin was collected with result of 4. I viewed pt's chest xray and it does not suggest acute infectious processes, do not feel it is necessary to repeat image today.  On arrival to ED today she was afebrile, normotensive, tachycardic to 107, no hypoxia. On my exam heart rate ranging from 90-105. She is well appearing and in no acute distress. Lungs are clear to auscultation in all fields.  Abdomen is nontender, no peritoneal signs.  She does have very mild bilateral lower extremity edema, nonpitting. EKG today shows a fib with rate of 97.  Significant labs documented in ED course above. She is currently asymptomatic, discussed with ED attending who agree to hold off on heparin for now. This case was discussed with Dr. Renaye Davidson who has seen the patient and agrees with plan to admit. Case discussed with cardiology Azalee Course PA.  Patient was evaluated by him as well as Brooke Davidson who agrees with admission for nuclear stress test. Spoke  with Dr. Jacqulyn Davidson with hospitalist service who agrees to assume care of patient and bring into the hospital for further evaluation and management.     Portions of this note were generated with Scientist, clinical (histocompatibility and immunogenetics). Dictation errors may occur despite best attempts at proofreading.   Final Clinical Impression(s) / ED Diagnoses Final diagnoses:  Nonspecific chest pain    Rx / DC Orders ED Discharge Orders    None  Brooke Davidson 06/06/19 1226    Brooke Sleeper, MD 06/06/19 1327    Brooke Sires, PA-C 06/06/19 1430    Brooke Sleeper, MD 06/06/19 [phone]

## 2019-06-06 NOTE — ED Notes (Signed)
Pt ambulated to RR .  

## 2019-06-06 NOTE — ED Notes (Signed)
Lunch Tray Ordered For Pt

## 2019-06-06 NOTE — Discharge Instructions (Addendum)
Cardiology has recommended that you double your Toprol XL- a new prescription has been written for you.   You were cared for by a hospitalist during your hospital stay. If you have any questions about your discharge medications or the care you received while you were in the hospital after you are discharged, you can call the unit and asked to speak with the hospitalist on call if the hospitalist that took care of you is not available. Once you are discharged, your primary care physician will handle any further medical issues.   Please note that NO REFILLS for any discharge medications will be authorized once you are discharged, as it is imperative that you return to your primary care physician (or establish a relationship with a primary care physician if you do not have one) for your aftercare needs so that they can reassess your need for medications and monitor your lab values.  Please take all your medications with you for your next visit with your Primary MD. Please ask your Primary MD to get all Hospital records sent to his/her office. Please request your Primary MD to go over all hospital test results at the follow up.   If you experience worsening of your admission symptoms, develop shortness of breath, chest pain, suicidal or homicidal thoughts or a life threatening emergency, you must seek medical attention immediately by calling 911 or calling your MD.   Dennis Bast must read the complete instructions/literature along with all the possible adverse reactions/side effects for all the medicines you take including new medications that have been prescribed to you. Take new medicines after you have completely understood and accpet all the possible adverse reactions/side effects.    Do not drive when taking pain medications or sedatives.     Do not take more than prescribed Pain, Sleep and Anxiety Medications   If you have smoked or chewed Tobacco in the last 2 yrs please stop. Stop any regular alcohol   and or recreational drug use.   Wear Seat belts while driving.
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
## 2019-06-07 DIAGNOSIS — I4892 Unspecified atrial flutter: Principal | ICD-10-CM

## 2019-06-07 LAB — CBC
HCT: 38.2 % (ref 36.0–46.0)
Hemoglobin: 12.6 g/dL (ref 12.0–15.0)
MCH: 26.6 pg (ref 26.0–34.0)
MCHC: 33 g/dL (ref 30.0–36.0)
MCV: 80.8 fL (ref 80.0–100.0)
Platelets: 284 10*3/uL (ref 150–400)
RBC: 4.73 MIL/uL (ref 3.87–5.11)
RDW: 13.7 % (ref 11.5–15.5)
WBC: 6.1 10*3/uL (ref 4.0–10.5)
nRBC: 0 % (ref 0.0–0.2)

## 2019-06-07 LAB — BASIC METABOLIC PANEL
Anion gap: 6 (ref 5–15)
BUN: 25 mg/dL — ABNORMAL HIGH (ref 8–23)
CO2: 27 mmol/L (ref 22–32)
Calcium: 9.5 mg/dL (ref 8.9–10.3)
Chloride: 107 mmol/L (ref 98–111)
Creatinine, Ser: 1.17 mg/dL — ABNORMAL HIGH (ref 0.44–1.00)
GFR calc Af Amer: 54 mL/min — ABNORMAL LOW (ref >60)
GFR calc non Af Amer: 47 mL/min — ABNORMAL LOW (ref >60)
Glucose, Bld: 108 mg/dL — ABNORMAL HIGH (ref 70–99)
Potassium: 4.7 mmol/L (ref 3.5–5.1)
Sodium: 140 mmol/L (ref 135–145)

## 2019-06-07 LAB — GLUCOSE, CAPILLARY
Glucose-Capillary: 88 mg/dL (ref 70–99)
Glucose-Capillary: 143 mg/dL — ABNORMAL HIGH (ref 70–99)
Glucose-Capillary: 96 mg/dL (ref 70–99)
Glucose-Capillary: 96 mg/dL (ref 70–99)

## 2019-06-07 NOTE — Progress Notes (Signed)
PROGRESS NOTE    Brooke Davidson   ZOX:[id number]  DOB: 07/22/1945  DOA: 06/05/2019 PCP: [name], MD   Brief Narrative:  Brooke Davidson is a 73 y.o. female with medical history significant of hypertension, hyperlipidemia, diabetes mellitus, paroxysmal A. fib on Eliquis, CKD stage III, anxiety/insomnia presents to ED due to intermittent and exertional chest pain since 3 days.    Subjective: Chest pain has not recurred.   Assessment & Plan:   Principal Problem:   Chest pain - negative troponin- EKG shows controlled A flutter - Cardiology plans for her to have a stress test tomorrow  Active Problems:    DM (diabetes mellitus)   - holding Januvia- on SSI   A-flutter - cont Eliquis and Metoprolol- rate controlled    Hypertension - BP a little low- cont Metoprolol- hold Losartan/HCTZ  CKD3 - Cr 0.9 a few yrs ago but now apperas to be ~ 1.1 - Cr 1.44 on 12/24- holding Losartan/HCTZ (BP slightly low as well)    HLD (hyperlipidemia) - cont Pravachol    Asthma - no wheezing at this time    Insomnia - cont Ambien      Time spent in minutes: 35 DVT prophylaxis: Eliquis Code Status: Full code Family Communication:  Disposition Plan: stess test tomorrow Consultants:   cardiology Procedures:    Antimicrobials:  Anti-infectives (From admission, onward)   None       Objective: Vitals:   06/06/19 2000 06/06/19 2206 06/07/19 0519 06/07/19 0811  BP:  101/70 (!) 150/115   Pulse: (!) 106 92 77   Resp: 18     Temp:  (!) 97.4 F (36.3 C) 97.9 F (36.6 C)   TempSrc:  Oral Oral   SpO2: 98% 98% 93% 95%   No intake or output data in the 24 hours ending 06/07/19 1359 There were no vitals filed for this visit.  Examination: General exam: Appears comfortable  HEENT: PERRLA, oral mucosa moist, no sclera icterus or thrush Respiratory system: Clear to auscultation. Respiratory effort normal. Cardiovascular system: S1 & S2 heard, RRR.    Gastrointestinal system: Abdomen soft, non-tender, nondistended. Normal bowel sounds. Central nervous system: Alert and oriented. No focal neurological deficits. Extremities: No cyanosis, clubbing or edema Skin: No rashes or ulcers Psychiatry:  Mood & affect appropriate.     Data Reviewed: I have personally reviewed following labs and imaging studies  CBC: Recent Labs  Lab 06/04/19 1719 06/06/19 0802 06/07/19 0312  WBC 5.7 6.4 6.1  NEUTROABS  --  2.6  --   HGB 13.0 13.0 12.6  HCT 40.7 39.6 38.2  MCV 81.7 80.2 80.8  PLT 316 294 284   Basic Metabolic Panel: Recent Labs  Lab 06/04/19 1719 06/06/19 0802 06/06/19 1717 06/07/19 0312  NA 137 139  --  140  K 4.3 3.9  --  4.7  CL 105 104  --  107  CO2 21* 21*  --  27  GLUCOSE 98 152*  --  108*  BUN 12 19  --  25*  CREATININE 1.11* 1.44*  --  1.17*  CALCIUM 9.3 9.6  --  9.5  MG  --   --  2.0  --    GFR: CrCl cannot be calculated (Unknown ideal weight.). Liver Function Tests: No results for input(s): AST, ALT, ALKPHOS, BILITOT, PROT, ALBUMIN in the last 168 hours. No results for input(s): LIPASE, AMYLASE in the last 168 hours. No results for input(s): AMMONIA in the last 168 hours. Coagulation  Profile: No results for input(s): INR, PROTIME in the last 168 hours. Cardiac Enzymes: No results for input(s): CKTOTAL, CKMB, CKMBINDEX, TROPONINI in the last 168 hours. BNP (last 3 results) No results for input(s): PROBNP in the last 8760 hours. HbA1C: Recent Labs    06/06/19 1717  HGBA1C 6.1*   CBG: Recent Labs  Lab 06/06/19 0637 06/06/19 1846 06/06/19 2208 06/07/19 0729 06/07/19 1300  GLUCAP 87 105* 97 88 143*   Lipid Profile: No results for input(s): CHOL, HDL, LDLCALC, TRIG, CHOLHDL, LDLDIRECT in the last 72 hours. Thyroid Function Tests: No results for input(s): TSH, T4TOTAL, FREET4, T3FREE, THYROIDAB in the last 72 hours. Anemia Panel: No results for input(s): VITAMINB12, FOLATE, FERRITIN, TIBC, IRON,  RETICCTPCT in the last 72 hours. Urine analysis:    Component Value Date/Time   COLORURINE YELLOW 10/15/2018 2350   APPEARANCEUR HAZY (A) 10/15/2018 2350   LABSPEC 1.011 10/15/2018 2350   PHURINE 5.0 10/15/2018 2350   GLUCOSEU NEGATIVE 10/15/2018 2350   HGBUR NEGATIVE 10/15/2018 2350   BILIRUBINUR NEGATIVE 10/15/2018 2350   KETONESUR NEGATIVE 10/15/2018 2350   PROTEINUR NEGATIVE 10/15/2018 2350   NITRITE NEGATIVE 10/15/2018 2350   LEUKOCYTESUR MODERATE (A) 10/15/2018 2350   Sepsis Labs: @LABRCNTIP (procalcitonin:4,lacticidven:4) ) Recent Results (from the past 240 hour(s))  SARS CORONAVIRUS 2 (TAT 6-24 HRS) Nasopharyngeal Nasopharyngeal Swab     Status: None   Collection Time: 06/06/19  8:01 AM   Specimen: Nasopharyngeal Swab  Result Value Ref Range Status   SARS Coronavirus 2 NEGATIVE NEGATIVE Final    Comment: (NOTE) SARS-CoV-2 target nucleic acids are NOT DETECTED. The SARS-CoV-2 RNA is generally detectable in upper and lower respiratory specimens during the acute phase of infection. Negative results do not preclude SARS-CoV-2 infection, do not rule out co-infections with other pathogens, and should not be used as the sole basis for treatment or other patient management decisions. Negative results must be combined with clinical observations, patient history, and epidemiological information. The expected result is Negative. Fact Sheet for Patients: SugarRoll.be Fact Sheet for Healthcare Providers: https://www.woods-mathews.com/ This test is not yet approved or cleared by the [place] FDA and  has been authorized for detection and/or diagnosis of SARS-CoV-2 by FDA under an Emergency Use Authorization (EUA). This EUA will remain  in effect (meaning this test can be used) for the duration of the COVID-19 declaration under Section 56 4(b)(1) of the Act, 21 U.S.C. section 360bbb-3(b)(1), unless the authorization is terminated  or revoked sooner. Performed at [facility], [place] [address], [place], [place] [address]          Radiology Studies: No results found.    Scheduled Meds: . acetaminophen  1,000 mg Oral QHS  . apixaban  5 mg Oral BID  . insulin aspart  0-15 Units Subcutaneous TID WC  . insulin aspart  0-5 Units Subcutaneous QHS  . metoprolol succinate  25 mg Oral Daily  . mometasone-formoterol  2 puff Inhalation BID  . pravastatin  80 mg Oral QHS  . sodium chloride flush  3 mL Intravenous Once   Continuous Infusions: . sodium chloride 75 mL/hr at 06/07/19 0841     LOS: 1 day      Debbe Odea, MD [facility] Pager: [email or web address] Password [facility] 06/07/2019, 1:59 PM

## 2019-06-07 NOTE — Progress Notes (Signed)
At bedside to assess PIV for complications.  Pt walking freely in the room without difficulty. Talking quickly and loudly.  States PIV site is "excruciatingly sore" while pointing at the taped area distal to the insertion site.  States "Look at it! Do they think I am FAT? Why put such a large tubing on me as tiny as I am?"  Explained the tubing is one size fits all.  "So everybody is fat?!" she replied.Argumenative and complaining entire time of visit- from ED staff, to doctor, to PIV, to equipment, asking about the call system and if people were listening in on her. Assessed site thoroughly, tape intact, site CDI, no redness, no drainage, no swelling.  Great blood return when flushed, without any evidence of discomfort with flushing.  State no doctor has seen her since she was in the ED.  Explained the same apparatus, tubing and tape will be applied to her if PIV restarted.  States she will wait until the doctor comes in for another PIV to be placed.  Clair Gulling RN notified.

## 2019-06-07 NOTE — Progress Notes (Signed)
Plan for Nucelar stress test tomorrow.  SHe is NPO after MN tonight.

## 2019-06-07 NOTE — Plan of Care (Signed)
  Problem: Education: Goal: Understanding of CV disease, CV risk reduction, and recovery process will improve Outcome: Progressing Goal: Individualized Educational Video(s) Outcome: Progressing   Problem: Activity: Goal: Ability to return to baseline activity level will improve Outcome: Progressing   

## 2019-06-08 ENCOUNTER — Inpatient Hospital Stay (HOSPITAL_COMMUNITY): Payer: Medicare Other

## 2019-06-08 DIAGNOSIS — R079 Chest pain, unspecified: Secondary | ICD-10-CM

## 2019-06-08 LAB — CBC
HCT: 37.6 % (ref 36.0–46.0)
Hemoglobin: 12.6 g/dL (ref 12.0–15.0)
MCH: 26.8 pg (ref 26.0–34.0)
MCHC: 33.5 g/dL (ref 30.0–36.0)
MCV: 80 fL (ref 80.0–100.0)
Platelets: 280 10*3/uL (ref 150–400)
RBC: 4.7 MIL/uL (ref 3.87–5.11)
RDW: 13.8 % (ref 11.5–15.5)
WBC: 6.9 10*3/uL (ref 4.0–10.5)
nRBC: 0 % (ref 0.0–0.2)

## 2019-06-08 LAB — NM MYOCAR MULTI W/SPECT W/WALL MOTION / EF
Estimated workload: 1 METS
Exercise duration (min): 0 min
Exercise duration (sec): 0 s
MPHR: 148 {beats}/min
Peak HR: 122 {beats}/min
Percent HR: 82 %
Rest HR: 102 {beats}/min

## 2019-06-08 LAB — BASIC METABOLIC PANEL
Anion gap: 11 (ref 5–15)
BUN: 30 mg/dL — ABNORMAL HIGH (ref 8–23)
CO2: 23 mmol/L (ref 22–32)
Calcium: 9.2 mg/dL (ref 8.9–10.3)
Chloride: 106 mmol/L (ref 98–111)
Creatinine, Ser: 1.09 mg/dL — ABNORMAL HIGH (ref 0.44–1.00)
GFR calc Af Amer: 59 mL/min — ABNORMAL LOW (ref >60)
GFR calc non Af Amer: 51 mL/min — ABNORMAL LOW (ref >60)
Glucose, Bld: 100 mg/dL — ABNORMAL HIGH (ref 70–99)
Potassium: 4.7 mmol/L (ref 3.5–5.1)
Sodium: 140 mmol/L (ref 135–145)

## 2019-06-08 LAB — GLUCOSE, CAPILLARY
Glucose-Capillary: 94 mg/dL (ref 70–99)
Glucose-Capillary: 121 mg/dL — ABNORMAL HIGH (ref 70–99)

## 2019-06-08 MED ORDER — REGADENOSON 0.4 MG/5ML IV SOLN
0.4000 mg | Freq: Once | INTRAVENOUS | Status: AC
  Administered 2019-06-08: 0.4 mg via INTRAVENOUS
  Filled 2019-06-08: qty 5

## 2019-06-08 MED ORDER — TECHNETIUM TC 99M TETROFOSMIN IV KIT
10.8500 | PACK | Freq: Once | INTRAVENOUS | Status: AC | PRN
  Administered 2019-06-08: 10.85 via INTRAVENOUS

## 2019-06-08 MED ORDER — REGADENOSON 0.4 MG/5ML IV SOLN
INTRAVENOUS | Status: AC
  Filled 2019-06-08: qty 5

## 2019-06-08 MED ORDER — TECHNETIUM TC 99M TETROFOSMIN IV KIT
32.0000 | PACK | Freq: Once | INTRAVENOUS | Status: AC | PRN
  Administered 2019-06-08: 32 via INTRAVENOUS

## 2019-06-08 MED ORDER — METOPROLOL SUCCINATE ER 25 MG PO TB24: 50.0000 mg | ORAL_TABLET | Freq: Every day | ORAL | 0 refills | Status: AC

## 2019-06-08 NOTE — Plan of Care (Signed)
  Problem: Cardiovascular: Goal: Ability to achieve and maintain adequate cardiovascular perfusion will improve Outcome: Progressing Goal: Vascular access site(s) Level 0-1 will be maintained Outcome: Progressing   

## 2019-06-08 NOTE — Progress Notes (Addendum)
Progress Note  Patient Name: Brooke Davidson Date of Encounter: 06/08/2019  Primary Cardiologist: Brooke Custard, MD  Subjective   No c/p or sob.  For myoview this AM.  Inpatient Medications    Scheduled Meds: . acetaminophen  1,000 mg Oral QHS  . apixaban  5 mg Oral BID  . insulin aspart  0-15 Units Subcutaneous TID WC  . insulin aspart  0-5 Units Subcutaneous QHS  . metoprolol succinate  25 mg Oral Daily  . mometasone-formoterol  2 puff Inhalation BID  . pravastatin  80 mg Oral QHS  . regadenoson      . regadenoson  0.4 mg Intravenous Once  . sodium chloride flush  3 mL Intravenous Once   Continuous Infusions: . sodium chloride 75 mL/hr at 06/08/19 0110   PRN Meds: albuterol, morphine injection, nitroGLYCERIN, ondansetron **OR** ondansetron (ZOFRAN) IV, zolpidem   Vital Signs    Vitals:   06/07/19 1635 06/07/19 2100 06/07/19 2202 06/08/19 0430  BP: (!) 116/95 118/69  98/79  Pulse:  79 (!) 104 91  Resp:   18   Temp:  (!) 97.5 F (36.4 C)  98.9 F (37.2 C)  TempSrc:  Axillary  Oral  SpO2:  94% 98% 100%  Weight:    68.9 kg  Height:    5\' 4"  (1.626 m)    Intake/Output Summary (Last 24 hours) at 06/08/2019 0943 Last data filed at 06/08/2019 0600 Gross per 24 hour  Intake 1838.75 ml  Output -  Net 1838.75 ml   Filed Weights   06/08/19 0430  Weight: 68.9 kg    Physical Exam   GEN: Well nourished, well developed, in no acute distress.  HEENT: Grossly normal.  Neck: Supple, no JVD, carotid bruits, or masses. Cardiac: RRR, no murmurs, rubs, or gallops. No clubbing, cyanosis, edema.  Radials/DP/PT 2+ and equal bilaterally.  Respiratory:  Respirations regular and unlabored, clear to auscultation bilaterally. GI: Soft, nontender, nondistended, BS + x 4. MS: no deformity or atrophy. Skin: warm and dry, no rash. Neuro:  Strength and sensation are intact. Psych: AAOx3.  Normal affect.  Labs    Chemistry Recent Labs  Lab 06/06/19 0802 06/07/19 0312  06/08/19 0534  NA 139 140 140  K 3.9 4.7 4.7  CL 104 107 106  CO2 21* 27 23  GLUCOSE 152* 108* 100*  BUN 19 25* 30*  CREATININE 1.44* 1.17* 1.09*  CALCIUM 9.6 9.5 9.2  GFRNONAA 36* 47* 51*  GFRAA 42* 54* 59*  ANIONGAP 14 6 11      Hematology Recent Labs  Lab 06/06/19 0802 06/07/19 0312 06/08/19 0534  WBC 6.4 6.1 6.9  RBC 4.94 4.73 4.70  HGB 13.0 12.6 12.6  HCT 39.6 38.2 37.6  MCV 80.2 80.8 80.0  MCH 26.3 26.6 26.8  MCHC 32.8 33.0 33.5  RDW 13.7 13.7 13.8  PLT 294 284 280    Cardiac Enzymes  Recent Labs  Lab 06/04/19 1719 06/05/19 1839 06/05/19 2046 06/06/19 0802  TROPONINIHS 4 3 3 3        Radiology    No results found.  Telemetry    RSR - seen in nuc med - Personally Reviewed  Cardiac Studies   Echo 05/02/2019 Left Ventricle: Systolic function is normal with an ejection fraction  of 55-65%. . Left Ventricle: Wall motion is within normal limits. . Mitral Valve: There is moderate regurgitation with an eccentrically  directed jet. . Mitral Valve: The leaflets are mildly thickened. . Tricuspid Valve: There is  mild to moderate regurgitation. The tricuspid  valve regurgitation jet is eccentric. . Tricuspid Valve: The estimated pulmonary pressures are mildly elevated. . Pulmonic Valve: Mild to moderate regurgitation.  Patient Profile     73 y.o. female with a hx of  HTN, HLD, DM 2 and recently diagnosed atrial fibrillation, who was admitted 12/23 for chest pain, and has since r/o.  Assessment & Plan    1.  Midsternal chest pain: Admitted 12/22 w/ 3 days of intermittent chest pain.  HsTrop nl. No recurrent c/p.  For myoview this AM.  2.  Persistent Afib:  Dx 11/2.  On eliquis. Rates mostly 80's to 90's.  Sl higher this AM in nuc med, however, she has not yet received AM metoprolol.  [name] f/u w/ outpt cardiologist for rhythm mgmt.  3.  Essential HTN:  Stable on current meds.  4.  HL: On statin.  5.  DMII: A1c 6.1  6.  AKI:  Creat better  w/ hydration.  Signed, Nicolasa Ducking, NP  06/08/2019, 9:43 AM    For questions or updates, please contact   Please consult [email or web address] for contact info under Cardiology/STEMI.   I have seen and examined this patient with Ward Givens.  Agree with above, note added to reflect my findings.  On exam, iRRR, no murmurs, lungs clear. Patient presented with chest pain. Had myoview low risk. Ok for discharge today. Needs close follow up with her [facility] cardiologist. Would double her Toprol XL at discharge for improved HR control.    Zyad Boomer M. Cheryel Kyte MD 06/08/2019 12:09 PM

## 2019-06-08 NOTE — Discharge Summary (Addendum)
Physician Discharge Summary  Brooke Davidson FVC:[id number] DOB: 05/20/1946 DOA: 06/05/2019  PCP: [name], MD  Admit date: 06/05/2019 Discharge date: 06/08/2019  Admitted From: home  Disposition:  home   Recommendations for Outpatient Follow-up:  1. F/u HR and BP in 1 wk  Home Health:  none  Equipment/Devices:  none    Discharge Condition:  stable   CODE STATUS:  Full code   Diet recommendation:  Heart healthy and diabetic Consultations:  cardiology    Discharge Diagnoses:  Principal Problem:   Chest pain Active Problems:   DM (diabetes mellitus) (HCC)   Hypertension   HLD (hyperlipidemia)   Asthma   Insomnia   A-fib (HCC)      Brief Summary: Brooke Davidson is a 73 y.o.femalewith medical history significant ofhypertension, hyperlipidemia, diabetes mellitus, paroxysmal A. fib on Eliquis, CKD stage III, anxiety/insomnia presents to ED due to intermittent and exertional chest pain since 3 days  Hospital Course:  Principal Problem:   Chest pain - negative troponin- EKG shows controlled A flutter - Cardiology recommended a stress test which was done today and is "low risk"- she will be discharged   Active Problems:    DM (diabetes mellitus)   - cont Januvia   A-flutter - cont Eliquis and B blocker - Cardiology has recommended to double her Toprol from 25 to 50 mg which I have done    Hypertension - BP a little low-  - Losartan/HCTZ on hold while in hospital (see below)  CKD3 - Cr 0.9 a few yrs ago but now apperas to be ~ 1.1 - Cr 1.44 on 12/24- holding Losartan/HCTZ (BP slightly low as well) while in the hospital - Cr improved to 1.09    HLD (hyperlipidemia) - cont Pravachol    Asthma - no wheezing at this time    Insomnia - cont Ambien   Discharge Exam: Vitals:   06/08/19 1211 06/08/19 1410  BP:  121/66  Pulse: 97 76  Resp:    Temp:  98.5 F (36.9 C)  SpO2:  99%   Vitals:   06/08/19 0952 06/08/19 0955  06/08/19 1211 06/08/19 1410  BP: 131/89 (!) 143/73  121/66  Pulse:   97 76  Resp:      Temp:    98.5 F (36.9 C)  TempSrc:    Oral  SpO2:    99%  Weight:      Height:        General: Pt is alert, awake, not in acute distress Cardiovascular: RRR, S1/S2 +, no rubs, no gallops Respiratory: CTA bilaterally, no wheezing, no rhonchi Abdominal: Soft, NT, ND, bowel sounds + Extremities: no edema, no cyanosis   Discharge Instructions  Discharge Instructions    Diet - low sodium heart healthy   Complete by: As directed    Increase activity slowly   Complete by: As directed      Allergies as of 06/08/2019      Reactions   Bactrim [sulfamethoxazole-trimethoprim] Swelling   Tongue swells   Ivp Dye [iodinated Diagnostic Agents] Hives   Clonazepam    dizziness   Sulfa Antibiotics Swelling   Codeine Itching   Other Itching, Rash   Perfume   Vicodin [hydrocodone-acetaminophen] Itching      Medication List    TAKE these medications   acetaminophen 500 MG tablet Commonly known as: TYLENOL Take 1,000 mg by mouth at bedtime.   albuterol 108 (90 Base) MCG/ACT inhaler Commonly known as: VENTOLIN HFA Inhale 2 puffs  into the lungs every 6 (six) hours as needed for wheezing or shortness of breath.   budesonide-formoterol 80-4.5 MCG/ACT inhaler Commonly known as: SYMBICORT Inhale 2 puffs into the lungs 2 (two) times daily.   cetirizine 10 MG tablet Commonly known as: ZYRTEC Take 1 tablet (10 mg total) by mouth daily.   diclofenac sodium 1 % Gel Commonly known as: VOLTAREN Apply topically 4 (four) times daily.   Eliquis 5 MG Tabs tablet Generic drug: apixaban Take 5 mg by mouth 2 (two) times daily.   Lac-Hydrin Five 5 % Lotn lotion Generic drug: ammonium lactate Apply 1 application topically 3 (three) times daily.   latanoprost 0.005 % ophthalmic solution Commonly known as: XALATAN Place 1 drop into both eyes at bedtime.   losartan-hydrochlorothiazide 50-12.5 MG  tablet Commonly known as: HYZAAR Take 1 tablet by mouth daily.   metoprolol succinate 25 MG 24 hr tablet Commonly known as: TOPROL-XL Take 2 tablets (50 mg total) by mouth daily. What changed: how much to take   Pataday 0.1 % ophthalmic solution Generic drug: olopatadine Place 1 drop into both eyes as needed for allergies.   pravastatin 80 MG tablet Commonly known as: PRAVACHOL Take 80 mg by mouth at bedtime.   sitaGLIPtin 100 MG tablet Commonly known as: JANUVIA Take 100 mg by mouth daily.   triamcinolone ointment 0.1 % Commonly known as: KENALOG Apply 1 application topically daily as needed.   zolpidem 10 MG tablet Commonly known as: AMBIEN Take 10 mg by mouth at bedtime as needed for sleep.      Follow-up Information    Wahid, Asif T, MD Follow up on 06/06/2019.   Specialty: Internal Medicine Contact information: [address] [place] [address] [phone]          Allergies  Allergen Reactions  . Bactrim [Sulfamethoxazole-Trimethoprim] Swelling    Tongue swells  . Ivp Dye [Iodinated Diagnostic Agents] Hives  . Clonazepam     dizziness  . Sulfa Antibiotics Swelling  . Codeine Itching  . Other Itching and Rash    Perfume  . Vicodin [Hydrocodone-Acetaminophen] Itching     Procedures/Studies:  DG Chest 2 View  Result Date: 06/04/2019 CLINICAL DATA:  Chest pain and shortness of breath for 3 days. EXAM: CHEST - 2 VIEW COMPARISON:  PA and lateral chest 02/23/2015. FINDINGS: The heart size and mediastinal contours are within normal limits. Atherosclerosis is noted. Both lungs are clear. The visualized skeletal structures are unremarkable. IMPRESSION: No acute disease. Atherosclerosis. Electronically Signed   By: Drusilla Kannerhomas  Dalessio M.D.   On: 06/04/2019 18:00   NM Myocar Multi W/Spect W/Wall Motion / EF  Result Date: 06/08/2019  There was no ST segment deviation noted during stress.  Nuclear stress EF: 72%. No wall motion abnormalities.   Transient ischemic dilatation, TID was 1.41 however on direct visualization cavity size does not appear to change with rest and stress.  Defect 1: There is a medium defect of mild severity present in the mid anterior and apical anterior location. No ischemia identified. No infarction. This likely represents breast attenuation artifact.  This is a low risk study. No ischemia identified.  Donato SchultzMark Skains, MD    The results of significant diagnostics from this hospitalization (including imaging, microbiology, ancillary and laboratory) are listed below for reference.     Microbiology: Recent Results (from the past 240 hour(s))  SARS CORONAVIRUS 2 (TAT 6-24 HRS) Nasopharyngeal Nasopharyngeal Swab     Status: None   Collection Time: 06/06/19  8:01  AM   Specimen: Nasopharyngeal Swab  Result Value Ref Range Status   SARS Coronavirus 2 NEGATIVE NEGATIVE Final    Comment: (NOTE) SARS-CoV-2 target nucleic acids are NOT DETECTED. The SARS-CoV-2 RNA is generally detectable in upper and lower respiratory specimens during the acute phase of infection. Negative results do not preclude SARS-CoV-2 infection, do not rule out co-infections with other pathogens, and should not be used as the sole basis for treatment or other patient management decisions. Negative results must be combined with clinical observations, patient history, and epidemiological information. The expected result is Negative. Fact Sheet for Patients: SugarRoll.be Fact Sheet for Healthcare Providers: https://www.woods-mathews.com/ This test is not yet approved or cleared by the [place] FDA and  has been authorized for detection and/or diagnosis of SARS-CoV-2 by FDA under an Emergency Use Authorization (EUA). This EUA will remain  in effect (meaning this test can be used) for the duration of the COVID-19 declaration under Section 56 4(b)(1) of the Act, 21 U.S.C. section 360bbb-3(b)(1), unless  the authorization is terminated or revoked sooner. Performed at [facility], [place] [address] [place], [place] [address]      Labs: BNP (last 3 results) No results for input(s): BNP in the last 8760 hours. Basic Metabolic Panel: Recent Labs  Lab 06/04/19 1719 06/06/19 0802 06/06/19 1717 06/07/19 0312 06/08/19 0534  NA 137 139  --  140 140  K 4.3 3.9  --  4.7 4.7  CL 105 104  --  107 106  CO2 21* 21*  --  27 23  GLUCOSE 98 152*  --  108* 100*  BUN 12 19  --  25* 30*  CREATININE 1.11* 1.44*  --  1.17* 1.09*  CALCIUM 9.3 9.6  --  9.5 9.2  MG  --   --  2.0  --   --    Liver Function Tests: No results for input(s): AST, ALT, ALKPHOS, BILITOT, PROT, ALBUMIN in the last 168 hours. No results for input(s): LIPASE, AMYLASE in the last 168 hours. No results for input(s): AMMONIA in the last 168 hours. CBC: Recent Labs  Lab 06/04/19 1719 06/06/19 0802 06/07/19 0312 06/08/19 0534  WBC 5.7 6.4 6.1 6.9  NEUTROABS  --  2.6  --   --   HGB 13.0 13.0 12.6 12.6  HCT 40.7 39.6 38.2 37.6  MCV 81.7 80.2 80.8 80.0  PLT 316 294 284 280   Cardiac Enzymes: No results for input(s): CKTOTAL, CKMB, CKMBINDEX, TROPONINI in the last 168 hours. BNP: Invalid input(s): POCBNP CBG: Recent Labs  Lab 06/07/19 1300 06/07/19 1630 06/07/19 2040 06/08/19 0743 06/08/19 1146  GLUCAP 143* 96 96 94 121*   D-Dimer No results for input(s): DDIMER in the last 72 hours. Hgb A1c Recent Labs    06/06/19 1717  HGBA1C 6.1*   Lipid Profile No results for input(s): CHOL, HDL, LDLCALC, TRIG, CHOLHDL, LDLDIRECT in the last 72 hours. Thyroid function studies No results for input(s): TSH, T4TOTAL, T3FREE, THYROIDAB in the last 72 hours.  Invalid input(s): FREET3 Anemia work up No results for input(s): VITAMINB12, FOLATE, FERRITIN, TIBC, IRON, RETICCTPCT in the last 72 hours. Urinalysis    Component Value Date/Time   COLORURINE YELLOW 10/15/2018 2350   APPEARANCEUR HAZY (A) 10/15/2018 2350    LABSPEC 1.011 10/15/2018 2350   PHURINE 5.0 10/15/2018 2350   GLUCOSEU NEGATIVE 10/15/2018 2350   HGBUR NEGATIVE 10/15/2018 2350   BILIRUBINUR NEGATIVE 10/15/2018 2350   KETONESUR NEGATIVE 10/15/2018 2350   PROTEINUR  NEGATIVE 10/15/2018 2350   NITRITE NEGATIVE 10/15/2018 2350   LEUKOCYTESUR MODERATE (A) 10/15/2018 2350   Sepsis Labs Invalid input(s): PROCALCITONIN,  WBC,  LACTICIDVEN Microbiology Recent Results (from the past 240 hour(s))  SARS CORONAVIRUS 2 (TAT 6-24 HRS) Nasopharyngeal Nasopharyngeal Swab     Status: None   Collection Time: 06/06/19  8:01 AM   Specimen: Nasopharyngeal Swab  Result Value Ref Range Status   SARS Coronavirus 2 NEGATIVE NEGATIVE Final    Comment: (NOTE) SARS-CoV-2 target nucleic acids are NOT DETECTED. The SARS-CoV-2 RNA is generally detectable in upper and lower respiratory specimens during the acute phase of infection. Negative results do not preclude SARS-CoV-2 infection, do not rule out co-infections with other pathogens, and should not be used as the sole basis for treatment or other patient management decisions. Negative results must be combined with clinical observations, patient history, and epidemiological information. The expected result is Negative. Fact Sheet for Patients: HairSlick.no Fact Sheet for Healthcare Providers: quierodirigir.com This test is not yet approved or cleared by the [place] FDA and  has been authorized for detection and/or diagnosis of SARS-CoV-2 by FDA under an Emergency Use Authorization (EUA). This EUA will remain  in effect (meaning this test can be used) for the duration of the COVID-19 declaration under Section 56 4(b)(1) of the Act, 21 U.S.C. section 360bbb-3(b)(1), unless the authorization is terminated or revoked sooner. Performed at [facility], [address], [place], [place] [address]      Time coordinating discharge in  minutes: 54  SIGNED:   Calvert Cantor, MD  [facility] 06/08/2019, 2:12 PM Pager   If 7PM-7AM, please contact night-coverage [email or web address] Password [email or web address]

## 2019-06-08 NOTE — Progress Notes (Signed)
Tolerated well

## 2019-06-08 NOTE — Progress Notes (Signed)
Breathing fast

## 2019-06-24 ENCOUNTER — Other Ambulatory Visit: Payer: Self-pay | Admitting: Family Medicine

## 2019-06-24 DIAGNOSIS — Z1231 Encounter for screening mammogram for malignant neoplasm of breast: Secondary | ICD-10-CM

## 2019-07-03 ENCOUNTER — Ambulatory Visit: Payer: Medicare HMO

## 2019-07-15 ENCOUNTER — Ambulatory Visit (INDEPENDENT_AMBULATORY_CARE_PROVIDER_SITE_OTHER): Payer: Medicare HMO | Admitting: Podiatry

## 2019-07-15 ENCOUNTER — Other Ambulatory Visit: Payer: Self-pay

## 2019-07-15 DIAGNOSIS — Q828 Other specified congenital malformations of skin: Secondary | ICD-10-CM

## 2019-07-15 NOTE — Patient Instructions (Signed)
Diabetes Mellitus and Foot Care Foot care is an important part of your health, especially when you have diabetes. Diabetes may cause you to have problems because of poor blood flow (circulation) to your feet and legs, which can cause your skin to:  Become thinner and drier.  Break more easily.  Heal more slowly.  Peel and crack. You may also have nerve damage (neuropathy) in your legs and feet, causing decreased feeling in them. This means that you may not notice minor injuries to your feet that could lead to more serious problems. Noticing and addressing any potential problems early is the best way to prevent future foot problems. How to care for your feet Foot hygiene  Wash your feet daily with warm water and mild soap. Do not use hot water. Then, pat your feet and the areas between your toes until they are completely dry. Do not soak your feet as this can dry your skin.  Trim your toenails straight across. Do not dig under them or around the cuticle. File the edges of your nails with an emery board or nail file.  Apply a moisturizing lotion or petroleum jelly to the skin on your feet and to dry, brittle toenails. Use lotion that does not contain alcohol and is unscented. Do not apply lotion between your toes. Shoes and socks  Wear clean socks or stockings every day. Make sure they are not too tight. Do not wear knee-high stockings since they may decrease blood flow to your legs.  Wear shoes that fit properly and have enough cushioning. Always look in your shoes before you put them on to be sure there are no objects inside.  To break in new shoes, wear them for just a few hours a day. This prevents injuries on your feet. Wounds, scrapes, corns, and calluses  Check your feet daily for blisters, cuts, bruises, sores, and redness. If you cannot see the bottom of your feet, use a mirror or ask someone for help.  Do not cut corns or calluses or try to remove them with medicine.  If you  find a minor scrape, cut, or break in the skin on your feet, keep it and the skin around it clean and dry. You may clean these areas with mild soap and water. Do not clean the area with peroxide, alcohol, or iodine.  If you have a wound, scrape, corn, or callus on your foot, look at it several times a day to make sure it is healing and not infected. Check for: ? Redness, swelling, or pain. ? Fluid or blood. ? Warmth. ? Pus or a bad smell. General instructions  Do not cross your legs. This may decrease blood flow to your feet.  Do not use heating pads or hot water bottles on your feet. They may burn your skin. If you have lost feeling in your feet or legs, you may not know this is happening until it is too late.  Protect your feet from hot and cold by wearing shoes, such as at the beach or on hot pavement.  Schedule a complete foot exam at least once a year (annually) or more often if you have foot problems. If you have foot problems, report any cuts, sores, or bruises to your health care provider immediately. Contact a health care provider if:  You have a medical condition that increases your risk of infection and you have any cuts, sores, or bruises on your feet.  You have an injury that is not   healing.  You have redness on your legs or feet.  You feel burning or tingling in your legs or feet.  You have pain or cramps in your legs and feet.  Your legs or feet are numb.  Your feet always feel cold.  You have pain around a toenail. Get help right away if:  You have a wound, scrape, corn, or callus on your foot and: ? You have pain, swelling, or redness that gets worse. ? You have fluid or blood coming from the wound, scrape, corn, or callus. ? Your wound, scrape, corn, or callus feels warm to the touch. ? You have pus or a bad smell coming from the wound, scrape, corn, or callus. ? You have a fever. ? You have a red line going up your leg. Summary  Check your feet every day  for cuts, sores, red spots, swelling, and blisters.  Moisturize feet and legs daily.  Wear shoes that fit properly and have enough cushioning.  If you have foot problems, report any cuts, sores, or bruises to your health care provider immediately.  Schedule a complete foot exam at least once a year (annually) or more often if you have foot problems. This information is not intended to replace advice given to you by your health care provider. Make sure you discuss any questions you have with your health care provider. Document Revised: 02/20/2019 Document Reviewed: 07/01/2016 Elsevier Patient Education  2020 [employer]  

## 2019-07-25 NOTE — Progress Notes (Signed)
07/25/2019 Brooke Davidson [id number] 1945/12/08  Chief Complaints: increased gas, increased urgency   HISTORY OF PRESENT ILLNESS:  [name] is a 74 year old female with a past medical history of asthma, hypertension, paroxysmal atrial fibrillation on Eliquis, diabetes mellitus type 2, chronic kidney disease stage III, chronic back pain and insomnia.  She complains of passing smelly gas per the rectum for the past month. She is passing 3 soft brown stools that fall apart in the toilet.  Increased urgency, she rushes to the bathroom. No rectal bleeding or black stools. No recent antibiotics. She had diarrhea for a few days 2 months ago which resolved after she took Imodium.  She underwent a screening colonoscopy with Dr. Adela Lank in 2017 which was incomplete due to a poor prep. She was advised to repeat a colonoscopy with a 2 day prep which was not done. She complains of having difficulty swallowing. She describes choking on water or solid food, feels like food gets stuck briefly to the upper esophagus then passes after a few seconds. No significant heartburn. No upper or lower abdominal pain. No weight loss. She was admitted to  [facility] with chest pain 06/05/2019.  A Twelve-lead EKG showed controlled atrial fibrillation. Troponin levels were negative.  She underwent a nuclear stress test which showed  low risk so she was discharged home 2/26. She denies having any chest pain since her hospital admission. No cough or SOB. No other complaints today.   She has a history of difficulty waking up from anesthesia at the age of 38 following her cholecystectomy. Since then, she's undergone other surgeries without any adverse response to sedation or anesthesia. See surgical history below.   CBC Latest Ref Rng & Units 06/08/2019 06/07/2019 06/06/2019  WBC 4.0 - 10.5 K/uL 6.9 6.1 6.4  Hemoglobin 12.0 - 15.0 g/dL 25.3 66.4 40.3  Hematocrit 36.0 - 46.0 % 37.6 38.2 39.6  Platelets 150  - 400 K/uL 280 284 294   CMP Latest Ref Rng & Units 06/08/2019 06/07/2019 06/06/2019  Glucose 70 - 99 mg/dL 474(Q) 595(G) 387(F)  BUN 8 - 23 mg/dL 64(P) 32(R) 19  Creatinine 0.44 - 1.00 mg/dL 5.18(A) 4.16(S) 0.63(K)  Sodium 135 - 145 mmol/L 140 140 139  Potassium 3.5 - 5.1 mmol/L 4.7 4.7 3.9  Chloride 98 - 111 mmol/L 106 107 104  CO2 22 - 32 mmol/L 23 27 21(L)  Calcium 8.9 - 10.3 mg/dL 9.2 9.5 9.6  Total Protein 6.5 - 8.1 g/dL - - -  Total Bilirubin 0.3 - 1.2 mg/dL - - -  Alkaline Phos 38 - 126 U/L - - -  AST 15 - 41 U/L - - -  ALT 14 - 54 U/L - - -    Colonoscopy 02/09/2016 by Dr. Adela Lank: - Preparation of the colon was poor. - Stool in the rectum and in the sigmoid colon, the procedure was aborted - No specimens collected.  Past Medical History:  Diagnosis Date  . Anemia    in the past  . Asthma    uses Symbicort daily and ALbuterol as needed  . Chronic back pain   . Complication of anesthesia    slow to wake up  . Constipation   . Contact dermatitis due to chemicals   . Cough    nonproductive and without fever  . Depression    but doesn't take any meds   . Diabetes mellitus without complication (HCC)    takes Metformin daily  .  Eczema   . Fall during current hospitalization 12/30/2015 12/30/2015  . GERD (gastroesophageal reflux disease)    doesn't take any meds  . Glaucoma    uses eye drops daily  . Heart murmur    states it was told to her as a younger woman, no problems that she knows   . History of bronchitis a yr ago  . HLD (hyperlipidemia)    takes Pravastatin daily  . Hypertension    takes Losartan-HCTZ daily  . Insomnia    takes Ambien nightly   . Joint pain   . Joint swelling   . Muscle spasm    takes Baclofen daily as needed  . Osteoarthritis    knees  . Primary localized osteoarthritis of left knee   . Primary localized osteoarthritis of right knee 12/17/2015  . Pulmonary nodule   . Rupture of right patellar tendon 12/31/2015   Patient fell  the second evening after total knee replacement and suffer and open patella tendon rupture of her right knee.  Surgically repaired after an extensive irrigation and debridement on 12/30/2015  . 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 Urinary frequency   . Urinary urgency    Past Surgical History:  Procedure Laterality Date  . GALLBLADDER SURGERY  1997  . IRRIGATION AND DEBRIDEMENT KNEE Right 12/30/2015   Procedure: IRRIGATION AND DEBRIDEMENT KNEE;  Surgeon: Salvatore Marvel, MD;  Location: [facility] OR;  Service: Orthopedics;  Laterality: Right;  . PATELLAR TENDON REPAIR Right 12/30/2015   Procedure: PATELLA TENDON REPAIR;  Surgeon: Salvatore Marvel, MD;  Location: [facility] OR;  Service: Orthopedics;  Laterality: Right;  . TOTAL KNEE ARTHROPLASTY Left 01/05/2015   Procedure: TOTAL KNEE ARTHROPLASTY;  Surgeon: Salvatore Marvel, MD;  Location: [facility] OR;  Service: Orthopedics;  Laterality: Left;  . TOTAL KNEE ARTHROPLASTY Right 12/28/2015   Procedure: TOTAL KNEE ARTHROPLASTY;  Surgeon: Salvatore Marvel, MD;  Location: [facility] OR;  Service: Orthopedics;  Laterality: Right;    reports that she has quit smoking. She has never used smokeless tobacco. She reports that she does not drink alcohol or use drugs. family history includes Cancer in her maternal grandmother; Colon cancer in her maternal grandmother; Diabetes Mellitus II in her mother; Glaucoma in her mother; Heart attack in her father; Hypertension in her sister. Allergies  Allergen Reactions  . Bactrim [Sulfamethoxazole-Trimethoprim] Swelling    Tongue swells  . Ivp Dye [Iodinated Diagnostic Agents] Hives  . Clonazepam     dizziness  . Sulfa Antibiotics Swelling  . Codeine Itching  . Other Itching and Rash    Perfume  . Vicodin [Hydrocodone-Acetaminophen] Itching      Outpatient Encounter Medications as of 07/26/2019  Medication Sig  . acetaminophen (TYLENOL) 500 MG tablet Take 1,000 mg by mouth at bedtime.   Marland Kitchen albuterol (PROVENTIL HFA;VENTOLIN  HFA) 108 (90 Base) MCG/ACT inhaler Inhale 2 puffs into the lungs every 6 (six) hours as needed for wheezing or shortness of breath.  Marland Kitchen apixaban (ELIQUIS) 5 MG TABS tablet Take 5 mg by mouth 2 (two) times daily.  . budesonide-formoterol (SYMBICORT) 80-4.5 MCG/ACT inhaler Inhale 2 puffs into the lungs 2 (two) times daily.  . cetirizine (ZYRTEC) 10 MG tablet Take 1 tablet (10 mg total) by mouth daily.  . diclofenac sodium (VOLTAREN) 1 % GEL Apply topically 4 (four) times daily.  . flecainide (TAMBOCOR) 50 MG tablet Take 50 mg by mouth 2 (two) times daily.  Marland Kitchen LAC-HYDRIN FIVE 5 % LOTN lotion Apply 1 application topically  3 (three) times daily.  Marland Kitchen latanoprost (XALATAN) 0.005 % ophthalmic solution Place 1 drop into both eyes at bedtime.   Marland Kitchen losartan-hydrochlorothiazide (HYZAAR) 50-12.5 MG tablet Take 1 tablet by mouth daily.   . metoprolol succinate (TOPROL-XL) 25 MG 24 hr tablet Take 2 tablets (50 mg total) by mouth daily.  . mometasone-formoterol (DULERA) 100-5 MCG/ACT AERO Inhale into the lungs.  Marland Kitchen olopatadine (PATADAY) 0.1 % ophthalmic solution Place 1 drop into both eyes as needed for allergies.  . pravastatin (PRAVACHOL) 80 MG tablet Take 80 mg by mouth at bedtime.   . propafenone (RYTHMOL) 150 MG tablet   . sitaGLIPtin (JANUVIA) 100 MG tablet Take 100 mg by mouth daily.  Marland Kitchen triamcinolone ointment (KENALOG) 0.1 % Apply 1 application topically daily as needed.   . triamcinolone ointment (KENALOG) 0.5 %   . zolpidem (AMBIEN) 10 MG tablet Take 10 mg by mouth at bedtime as needed for sleep.   No facility-administered encounter medications on file as of 07/26/2019.     REVIEW OF SYSTEMS  : All other systems reviewed and negative except where noted in the History of Present Illness.   PHYSICAL EXAM:BP 116/68 (BP Location: Right Arm)   Pulse (!) 56   Temp 97.8 F (36.6 C)   Ht 5\' 4"  (1.626 m)   Wt 158 lb 2 oz (71.7 kg)   BMI 27.14 kg/m   General: Well developed 74 year old female in no acute  distress. Head: Normocephalic and atraumatic. Eyes:  Sclerae non-icteric, conjunctive pink. Ears: Normal auditory acuity. Mouth: Upper and lower partial dentures. No ulcers or lesions.  Neck: Supple, no lymphadenopathy or thyromegaly.  Lungs: Clear bilaterally to auscultation without wheezes, crackles or rhonchi. Heart: Regular rate and rhythm. No murmur, rub or gallop appreciated.  Abdomen: Soft. RUQ tenderness.  Non distended. No masses. No hepatosplenomegaly. Normoactive bowel sounds x 4 quadrants. Large RUQ scar intact.  Rectal: Deferred. Musculoskeletal: Symmetrical with no gross deformities. Skin: Warm and dry. No rash or lesions on visible extremities. Extremities: No edema. Neurological: Alert oriented x 4, no focal deficits.  Psychological:  Alert and cooperative. Normal mood and affect.  ASSESSMENT AND PLAN:  51. 74 year old female with increased flatulence and urgency with BMs. -Colonoscopy benefits and risks discussed including risk with sedation, risk of bleeding, perforation and infection  -2 day bowel prep  -Phillip's bacteria probiotic  2. Dyshagia  -EGD at time of colonoscopy   3.  Atrial Fibrillation on Eliquis, Metoprolol and Rythmol -Our office will contact the patient's cardiologist to verify Eliquis instructions prior to EGD and colonoscopy   4. DM II  5.  Chronic kidney disease stage III  6. HTN on Losartan-HCTZ  Further follow up to be determined after EGD and colonoscopy completed    CC:  [place], [place]*

## 2019-07-26 ENCOUNTER — Encounter: Payer: Self-pay | Admitting: Nurse Practitioner

## 2019-07-26 ENCOUNTER — Other Ambulatory Visit: Payer: Self-pay

## 2019-07-26 ENCOUNTER — Other Ambulatory Visit (INDEPENDENT_AMBULATORY_CARE_PROVIDER_SITE_OTHER): Payer: Medicare HMO

## 2019-07-26 ENCOUNTER — Ambulatory Visit (INDEPENDENT_AMBULATORY_CARE_PROVIDER_SITE_OTHER): Payer: Medicare HMO | Admitting: Nurse Practitioner

## 2019-07-26 DIAGNOSIS — R143 Flatulence: Secondary | ICD-10-CM

## 2019-07-26 DIAGNOSIS — R1310 Dysphagia, unspecified: Secondary | ICD-10-CM

## 2019-07-26 DIAGNOSIS — Z1211 Encounter for screening for malignant neoplasm of colon: Secondary | ICD-10-CM

## 2019-07-26 LAB — CBC
HCT: 39.2 % (ref 36.0–46.0)
Hemoglobin: 12.9 g/dL (ref 12.0–15.0)
MCHC: 32.9 g/dL (ref 30.0–36.0)
MCV: 81.8 fl (ref 78.0–100.0)
Platelets: 315 10*3/uL (ref 150.0–400.0)
RBC: 4.8 Mil/uL (ref 3.87–5.11)
RDW: 15.1 % (ref 11.5–15.5)
WBC: 6.9 10*3/uL (ref 4.0–10.5)

## 2019-07-26 LAB — COMPREHENSIVE METABOLIC PANEL
ALT: 14 U/L (ref 0–35)
AST: 16 U/L (ref 0–37)
Albumin: 3.9 g/dL (ref 3.5–5.2)
Alkaline Phosphatase: 130 U/L — ABNORMAL HIGH (ref 39–117)
BUN: 18 mg/dL (ref 6–23)
CO2: 28 mEq/L (ref 19–32)
Calcium: 9.2 mg/dL (ref 8.4–10.5)
Chloride: 103 mEq/L (ref 96–112)
Creatinine, Ser: 1.07 mg/dL (ref 0.40–1.20)
GFR: 60.91 mL/min (ref >60.00)
Glucose, Bld: 98 mg/dL (ref 70–99)
Potassium: 4.7 mEq/L (ref 3.5–5.1)
Sodium: 138 mEq/L (ref 135–145)
Total Bilirubin: 0.4 mg/dL (ref 0.2–1.2)
Total Protein: 7.4 g/dL (ref 6.0–8.3)

## 2019-07-26 NOTE — Patient Instructions (Addendum)
If you are age 73 or older, your body mass index should be between 23-30. Your Body mass index is 27.14 kg/m. If this is out of the aforementioned range listed, please consider follow up with your Primary Care Provider.  If you are age 34 or younger, your body mass index should be between 19-25. Your Body mass index is 27.14 kg/m. If this is out of the aformentioned range listed, please consider follow up with your Primary Care Provider.   Your provider has requested that you go to the basement level for lab work before leaving today. Press "B" on the elevator. The lab is located at the first door on the left as you exit the elevator.  It has been recommended to you by your physician that you have a(n) Endoscopy/Colonoscopy completed. Per your request, we did not schedule the procedure(s) today. Please contact our office at [phone] should you decide to have the procedure completed. You will be scheduled for a pre-visit and procedure at that time.  You will be contacted by our office prior to your procedure for directions on holding your Plavix.  If you do not hear from our office 1 week prior to your scheduled procedure, please call [phone] to discuss.   Please purchase the following medications over the counter and take as directed:  START: Vear Clock Bacteria probiotic once daily.  Further follow up to be determined after EGD/Colonoscopy is complete.

## 2019-07-30 ENCOUNTER — Ambulatory Visit: Payer: Medicare HMO | Attending: Family Medicine | Admitting: Audiology

## 2019-07-30 NOTE — Progress Notes (Signed)
Agree with assessment and plan as outlined. She will need to hold Eliquis at least 2 days prior to her procedures to reduce bleeding risk, and perform 2 day prep given results of her last colonoscopy. Thanks

## 2019-08-02 ENCOUNTER — Other Ambulatory Visit: Payer: Self-pay

## 2019-08-02 DIAGNOSIS — R748 Abnormal levels of other serum enzymes: Secondary | ICD-10-CM

## 2019-08-06 ENCOUNTER — Ambulatory Visit: Payer: Medicare HMO

## 2019-08-16 ENCOUNTER — Ambulatory Visit: Payer: Medicare HMO | Admitting: Podiatry

## 2019-08-20 ENCOUNTER — Ambulatory Visit: Payer: Medicare HMO

## 2019-08-26 ENCOUNTER — Other Ambulatory Visit: Payer: Medicare HMO

## 2019-08-26 DIAGNOSIS — R748 Abnormal levels of other serum enzymes: Secondary | ICD-10-CM

## 2019-08-28 LAB — ANTI-SMOOTH MUSCLE ANTIBODY, IGG: Actin (Smooth Muscle) Antibody (IGG): <20 U (ref <20)

## 2019-08-28 LAB — ANTI-NUCLEAR AB-TITER (ANA TITER): ANA Titer 1: 1:80 {titer} — ABNORMAL HIGH

## 2019-08-28 LAB — MITOCHONDRIAL ANTIBODIES: Mitochondrial M2 Ab, IgG: <=20 U

## 2019-08-28 LAB — ANA: Anti Nuclear Antibody (ANA): POSITIVE — AB

## 2019-08-31 LAB — ALKALINE PHOSPHATASE, ISOENZYMES
Alkaline Phosphatase: 169 IU/L — ABNORMAL HIGH (ref 39–117)
BONE FRACTION: 63 % (ref 14–68)
INTESTINAL FRAC.: 14 % (ref 0–18)
LIVER FRACTION: 23 % (ref 18–85)

## 2019-09-02 ENCOUNTER — Other Ambulatory Visit: Payer: Self-pay

## 2019-09-02 DIAGNOSIS — R748 Abnormal levels of other serum enzymes: Secondary | ICD-10-CM

## 2019-09-04 ENCOUNTER — Telehealth: Payer: Self-pay | Admitting: Nurse Practitioner

## 2019-09-04 NOTE — Telephone Encounter (Signed)
Patient is calling requesting to speak with someone about the blood work order

## 2019-09-04 NOTE — Telephone Encounter (Signed)
Called and spoke with patient-patient wanted to let the provider know that Dr. Rinaldo Cloud prescribed Methimazole 5 mg [phone] phone number) for hyperthyroid dx (dx as of 09/03/2019); patient reports she is going to complete the lab work that was requested by Jill Side, NP on 09/05/2019;   Please advise if further information is needed/needs to be given to the patient

## 2019-09-04 NOTE — Telephone Encounter (Signed)
Noted  

## 2019-09-04 NOTE — Telephone Encounter (Signed)
Attempted to reach patient-unable to leave a VM-will attempt to reach patient at a later date/time; 

## 2019-09-09 ENCOUNTER — Ambulatory Visit: Payer: Medicare HMO | Admitting: Podiatry

## 2019-09-10 ENCOUNTER — Other Ambulatory Visit (INDEPENDENT_AMBULATORY_CARE_PROVIDER_SITE_OTHER): Payer: Medicare HMO

## 2019-09-10 DIAGNOSIS — R748 Abnormal levels of other serum enzymes: Secondary | ICD-10-CM | POA: Diagnosis not present

## 2019-09-10 LAB — GAMMA GT: GGT: 24 U/L (ref 7–51)

## 2019-09-16 ENCOUNTER — Other Ambulatory Visit: Payer: Self-pay

## 2019-09-16 ENCOUNTER — Ambulatory Visit
Admission: RE | Admit: 2019-09-16 | Discharge: 2019-09-16 | Disposition: A | Discharge status: Home / Self Care | Payer: Medicare HMO | Source: Ambulatory Visit | Attending: Family Medicine | Admitting: Family Medicine

## 2019-09-16 DIAGNOSIS — Z1231 Encounter for screening mammogram for malignant neoplasm of breast: Secondary | ICD-10-CM

## 2019-10-14 ENCOUNTER — Telehealth: Payer: Self-pay | Admitting: Nurse Practitioner

## 2019-10-14 NOTE — Telephone Encounter (Signed)
Labs faxed to the listed PCP

## 2019-10-14 NOTE — Telephone Encounter (Signed)
Pt requested to have results of labs sent to PCP.  She stated that she has an appt 10/25/19.

## 2019-10-18 ENCOUNTER — Other Ambulatory Visit: Payer: Self-pay

## 2019-10-22 ENCOUNTER — Ambulatory Visit (INDEPENDENT_AMBULATORY_CARE_PROVIDER_SITE_OTHER): Payer: Medicare HMO | Admitting: Internal Medicine

## 2019-10-22 ENCOUNTER — Encounter: Payer: Self-pay | Admitting: Internal Medicine

## 2019-10-22 ENCOUNTER — Other Ambulatory Visit: Payer: Self-pay

## 2019-10-22 VITALS — BP 130/80 | HR 62 | Ht 64.0 in | Wt 168.0 lb

## 2019-10-22 DIAGNOSIS — E213 Hyperparathyroidism, unspecified: Secondary | ICD-10-CM

## 2019-10-22 DIAGNOSIS — E0590 Thyrotoxicosis, unspecified without thyrotoxic crisis or storm: Secondary | ICD-10-CM

## 2019-10-22 DIAGNOSIS — E1121 Type 2 diabetes mellitus with diabetic nephropathy: Secondary | ICD-10-CM

## 2019-10-22 DIAGNOSIS — R748 Abnormal levels of other serum enzymes: Secondary | ICD-10-CM | POA: Diagnosis not present

## 2019-10-22 DIAGNOSIS — E1122 Type 2 diabetes mellitus with diabetic chronic kidney disease: Secondary | ICD-10-CM

## 2019-10-22 DIAGNOSIS — Z794 Long term (current) use of insulin: Secondary | ICD-10-CM

## 2019-10-22 DIAGNOSIS — N1831 Chronic kidney disease, stage 3a: Secondary | ICD-10-CM

## 2019-10-22 LAB — T3, FREE: T3, Free: 2.4 pg/mL (ref 2.3–4.2)

## 2019-10-22 LAB — TSH: TSH: 1.11 u[IU]/mL (ref 0.35–4.50)

## 2019-10-22 LAB — T4, FREE: Free T4: 0.57 ng/dL — ABNORMAL LOW (ref 0.60–1.60)

## 2019-10-22 NOTE — Patient Instructions (Addendum)
Please stop at the lab.  You can decrease Methimazole to 5 mg daily.  Check with your heart doctor if you can decrease Metoprolol.  Please come back for a follow-up appointment in 3 months.   Hyperthyroidism  Hyperthyroidism is when the thyroid gland is too active (overactive). The thyroid gland is a small gland located in the lower front part of the neck, just in front of the windpipe (trachea). This gland makes hormones that help control how the body uses food for energy (metabolism) as well as how the heart and brain function. These hormones also play a role in keeping your bones strong. When the thyroid is overactive, it produces too much of a hormone called thyroxine. What are the causes? This condition may be caused by:  Graves' disease. This is a disorder in which the body's disease-fighting system (immune system) attacks the thyroid gland. This is the most common cause.  Inflammation of the thyroid gland.  A tumor in the thyroid gland.  Use of certain medicines, including: ? Prescription thyroid hormone replacement. ? Herbal supplements that mimic thyroid hormones. ? Amiodarone therapy.  Solid or fluid-filled lumps within your thyroid gland (thyroid nodules).  Taking in a large amount of iodine from foods or medicines. What increases the risk? You are more likely to develop this condition if:  You are female.  You have a family history of thyroid conditions.  You smoke tobacco.  You use a medicine called lithium.  You take medicines that affect the immune system (immunosuppressants). What are the signs or symptoms? Symptoms of this condition include:  Nervousness.  Inability to tolerate heat.  Unexplained weight loss.  Diarrhea.  Change in the texture of hair or skin.  Heart skipping beats or making extra beats.  Rapid heart rate.  Loss of menstruation.  Shaky hands.  Fatigue.  Restlessness.  Sleep problems.  Enlarged thyroid gland or a lump  in the thyroid (nodule). You may also have symptoms of Graves' disease, which may include:  Protruding eyes.  Dry eyes.  Red or swollen eyes.  Problems with vision. How is this diagnosed? This condition may be diagnosed based on:  Your symptoms and medical history.  A physical exam.  Blood tests.  Thyroid ultrasound. This test involves using sound waves to produce images of the thyroid gland.  A thyroid scan. A radioactive substance is injected into a vein, and images show how much iodine is present in the thyroid.  Radioactive iodine uptake test (RAIU). A small amount of radioactive iodine is given by mouth to see how much iodine the thyroid absorbs after a certain amount of time. How is this treated? Treatment depends on the cause and severity of the condition. Treatment may include:  Medicines to reduce the amount of thyroid hormone your body makes.  Radioactive iodine treatment (radioiodine therapy). This involves swallowing a small dose of radioactive iodine, in capsule or liquid form, to kill thyroid cells.  Surgery to remove part or all of your thyroid gland. You may need to take thyroid hormone replacement medicine for the rest of your life after thyroid surgery.  Medicines to help manage your symptoms. Follow these instructions at home:   Take over-the-counter and prescription medicines only as told by your health care provider.  Do not use any products that contain nicotine or tobacco, such as cigarettes and e-cigarettes. If you need help quitting, ask your health care provider.  Follow any instructions from your health care provider about diet. You may be  instructed to limit foods that contain iodine.  Keep all follow-up visits as told by your health care provider. This is important. ? You will need to have blood tests regularly so that your health care provider can monitor your condition. Contact a health care provider if:  Your symptoms do not get better  with treatment.  You have a fever.  You are taking thyroid hormone replacement medicine and you: ? Have symptoms of depression. ? Feel like you are tired all the time. ? Gain weight. Get help right away if:  You have chest pain.  You have decreased alertness or a change in your awareness.  You have abdominal pain.  You feel dizzy.  You have a rapid heartbeat.  You have an irregular heartbeat.  You have difficulty breathing. Summary  The thyroid gland is a small gland located in the lower front part of the neck, just in front of the windpipe (trachea).  Hyperthyroidism is when the thyroid gland is too active (overactive) and produces too much of a hormone called thyroxine.  The most common cause is Graves' disease, a disorder in which your immune system attacks the thyroid gland.  Hyperthyroidism can cause various symptoms, such as unexplained weight loss, nervousness, inability to tolerate heat, or changes in your heartbeat.  Treatment may include medicine to reduce the amount of thyroid hormone your body makes, radioiodine therapy, surgery, or medicines to manage symptoms. This information is not intended to replace advice given to you by your health care provider. Make sure you discuss any questions you have with your health care provider. Document Revised: 05/12/2017 Document Reviewed: 05/10/2017 Elsevier Patient Education  2020 [employer].

## 2019-10-22 NOTE — Progress Notes (Signed)
Patient ID: Brooke Davidson, female   DOB: 10/02/1945, 74 y.o.   MRN: [id number]   This visit occurred during the SARS-CoV-2 public health emergency.  Safety protocols were in place, including screening questions prior to the visit, additional usage of staff PPE, and extensive cleaning of exam room while observing appropriate contact time as indicated for disinfecting solutions.   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  Thyrotoxicosis:  No thyroid tests are available for review: No results found for: TSH, FREET4, T3FREE  Antithyroid antibodies: No results found for: TSI  She was found to have an abnormal tests in 08/2019 by Dr. Terrence Dupont. She was started on methimazole 5 mg 2x daily in 08/2019 by cardiology.  Also, her metoprolol dose was increased to twice a day recently.  She started to feel fatigue and gained weight since her medication changes.  Pt denies: - feeling nodules in neck - hoarseness - dysphagia - choking - SOB with lying down  She denies: - fatigue - excessive sweating/heat intolerance - only when exercising at the gym - tremors - palpitations - hyperdefecation - weight loss - gained weight after starting Methimazole - hair loss  She has anxiety.  Pt does not have a FH of thyroid ds. No FH of thyroid cancer. No h/o radiation tx to head or neck. No seaweed or kelp, no recent contrast studies. No herbal supplements. No Biotin use.  No Prednisone, Amiodarone.  Elevated alkaline phosphatase:  Reviewed levels: Lab Results  Component Value Date   ALKPHOS 169 (H) 08/26/2019   ALKPHOS 130 (H) 07/26/2019   ALKPHOS 92 12/17/2015   ALKPHOS 86 12/07/2015   ALKPHOS 86 11/20/2015   ALKPHOS 81 12/30/2014   No recent fractures or bone disease.  No known cancer or bone metastasis.  No known liver  disease.  Hyperparathyroidism:  Reviewed pertinent labs: 09/09/2019: Calcium 9.4 (8.6-10.4), iPTH 125 (14-64) No results found for: PTH  Lab Results  Component Value Date   CALCIUM 9.2 07/26/2019   CALCIUM 9.2 06/08/2019   CALCIUM 9.5 06/07/2019   CALCIUM 9.6 06/06/2019   CALCIUM 9.3 06/04/2019   CALCIUM 8.4 (L) 12/31/2015   CALCIUM 8.9 12/30/2015   CALCIUM 8.7 (L) 12/29/2015   CALCIUM 9.4 12/17/2015   CALCIUM 9.6 12/07/2015   CALCIUM 9.7 11/20/2015   CALCIUM 8.4 (L) 01/07/2015   CALCIUM 8.7 (L) 01/06/2015   CALCIUM 9.0 12/30/2014   CALCIUM 9.4 08/24/2014   Unknown if she has history of vitamin D deficiency: No results found for: VD25OH   No history of kidney stones.  DM2:  This is well controlled.  Reviewed HbA1c levels: 09/09/2019: HbA1c 5.8% Lab Results  Component Value Date   HGBA1C 6.1 (H) 06/06/2019   HGBA1C 5.7 (H) 11/20/2015   HGBA1C 5.9 (H) 01/05/2015   She is on: - Januvia  100 mg before b'fast  She does have a Glucocom glucometer.  + Mild CKD; most recent BUN/creatinine: 09/09/2019: 26/1.42, GFR 43, ACR 6 She is on losartan 100 mg daily.  ? HL. No results found for: CHOL No results found for: HDL No results found for: LDLCALC No results found for: TRIG No results found for: CHOLHDL No results found for: LDLDIRECT She is on pravastatin 80 mg daily.  Also, on ASA 81, and Eliquis.  ROS: Constitutional: + see HPI, + excessive urination and nocturia, + poor sleep Eyes: no blurry  vision, no xerophthalmia ENT: no sore throat, + see HPI, + tinnitus Cardiovascular: no CP/+ SOB/no palpitations/leg swelling Respiratory: no cough/+ SOB Gastrointestinal: no N/V/D/C/+ heartburn Musculoskeletal: no muscle/joint aches Skin: no rashes, + itching (allergic reaction to detergent) Neurological: no tremors/numbness/tingling/dizziness Psychiatric: no depression/+ anxiety  Past Medical History:  Diagnosis Date  . Anemia    in the past  . Asthma    uses  Symbicort daily and ALbuterol as needed  . Chronic back pain   . Complication of anesthesia    slow to wake up  . Constipation   . Contact dermatitis due to chemicals   . Cough    nonproductive and without fever  . Depression    but doesn't take any meds   . Diabetes mellitus without complication ([place])    takes Metformin daily  . Eczema   . Fall during current hospitalization 12/30/2015 12/30/2015  . GERD (gastroesophageal reflux disease)    doesn't take any meds  . Glaucoma    uses eye drops daily  . Heart murmur    states it was told to her as a younger woman, no problems that she knows   . History of bronchitis a yr ago  . HLD (hyperlipidemia)    takes Pravastatin daily  . Hypertension    takes Losartan-HCTZ daily  . Insomnia    takes Ambien nightly   . Joint pain   . Joint swelling   . Muscle spasm    takes Baclofen daily as needed  . Osteoarthritis    knees  . Primary localized osteoarthritis of left knee   . Primary localized osteoarthritis of right knee 12/17/2015  . Pulmonary nodule   . Rupture of right patellar tendon 12/31/2015   Patient fell the second evening after total knee replacement and suffer and open patella tendon rupture of her right knee.  Surgically repaired after an extensive irrigation and debridement on 12/30/2015  . 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 Urinary frequency   . Urinary urgency    Past Surgical History:  Procedure Laterality Date  . [place]  . IRRIGATION AND DEBRIDEMENT KNEE Right 12/30/2015   Procedure: IRRIGATION AND DEBRIDEMENT KNEE;  Surgeon: Elsie Saas, MD;  Location: [place];  Service: Orthopedics;  Laterality: Right;  . PATELLAR TENDON REPAIR Right 12/30/2015   Procedure: PATELLA TENDON REPAIR;  Surgeon: Elsie Saas, MD;  Location: [place];  Service: Orthopedics;  Laterality: Right;  . TOTAL KNEE ARTHROPLASTY Left 01/05/2015   Procedure: TOTAL KNEE ARTHROPLASTY;  Surgeon: Elsie Saas, MD;   Location: [place];  Service: Orthopedics;  Laterality: Left;  . TOTAL KNEE ARTHROPLASTY Right 12/28/2015   Procedure: TOTAL KNEE ARTHROPLASTY;  Surgeon: Elsie Saas, MD;  Location: [place];  Service: Orthopedics;  Laterality: Right;   Social History   Socioeconomic History  . Marital status: Divorced    Spouse name: Not on file  . Number of children: Not on file  . Years of education: Not on file  . Highest education level: Not on file  Occupational History  . Not on file  Tobacco Use  . Smoking status: Former Research scientist (life sciences)  . Smokeless tobacco: Never Used  . Tobacco comment: quit smoking 24yr ago  Substance and Sexual Activity  . Alcohol use: No  . Drug use: No  . Sexual activity: Not on file  Other Topics Concern  . Not on file  Social History Narrative  . Not on file   Social Determinants of  Health   Financial Resource Strain:   . Difficulty of Paying Living Expenses:   Food Insecurity:   . Worried About Charity fundraiser in the Last Year:   . Arboriculturist in the Last Year:   Transportation Needs:   . Film/video editor (Medical):   Marland Kitchen Lack of Transportation (Non-Medical):   Physical Activity:   . Days of Exercise per Week:   . Minutes of Exercise per Session:   Stress:   . Feeling of Stress :   Social Connections:   . Frequency of Communication with Friends and Family:   . Frequency of Social Gatherings with Friends and Family:   . Attends Religious Services:   . Active Member of Clubs or Organizations:   . Attends Archivist Meetings:   Marland Kitchen Marital Status:   Intimate Partner Violence:   . Fear of Current or Ex-Partner:   . Emotionally Abused:   Marland Kitchen Physically Abused:   . Sexually Abused:    Current Outpatient Medications on File Prior to Visit  Medication Sig Dispense Refill  . acetaminophen (TYLENOL) 500 MG tablet Take 1,000 mg by mouth at bedtime.     Marland Kitchen albuterol (PROVENTIL HFA;VENTOLIN HFA) 108 (90 Base) MCG/ACT inhaler Inhale 2 puffs into the  lungs every 6 (six) hours as needed for wheezing or shortness of breath. 8 g 0  . apixaban (ELIQUIS) 5 MG TABS tablet Take 5 mg by mouth 2 (two) times daily.    . budesonide-formoterol (SYMBICORT) 80-4.5 MCG/ACT inhaler Inhale 2 puffs into the lungs 2 (two) times daily. 1 Inhaler 0  . cetirizine (ZYRTEC) 10 MG tablet Take 1 tablet (10 mg total) by mouth daily. 90 tablet 0  . latanoprost (XALATAN) 0.005 % ophthalmic solution Place 1 drop into both eyes at bedtime.   12  . losartan-hydrochlorothiazide (HYZAAR) 50-12.5 MG tablet Take 1 tablet by mouth daily.   2  . metoprolol succinate (TOPROL-XL) 25 MG 24 hr tablet Take 2 tablets (50 mg total) by mouth daily. 30 tablet 0  . mometasone-formoterol (DULERA) 100-5 MCG/ACT AERO Inhale into the lungs.    Marland Kitchen olopatadine (PATADAY) 0.1 % ophthalmic solution Place 1 drop into both eyes as needed for allergies.    . pravastatin (PRAVACHOL) 80 MG tablet Take 80 mg by mouth at bedtime.     . propafenone (RYTHMOL) 150 MG tablet     . sitaGLIPtin (JANUVIA) 100 MG tablet Take 100 mg by mouth daily.    Marland Kitchen zolpidem (AMBIEN) 10 MG tablet Take 10 mg by mouth at bedtime as needed for sleep.     No current facility-administered medications on file prior to visit.   Allergies  Allergen Reactions  . Bactrim [Sulfamethoxazole-Trimethoprim] Swelling    Tongue swells  . Ivp Dye [Iodinated Diagnostic Agents] Hives  . Clonazepam     dizziness  . Sulfa Antibiotics Swelling  . Codeine Itching  . Other Itching and Rash    Perfume  . Vicodin [Hydrocodone-Acetaminophen] Itching   Family History  Problem Relation Age of Onset  . Heart attack Father   . Glaucoma Mother   . Diabetes Mellitus II Mother   . Hypertension Sister   . Cancer Maternal Grandmother   . Colon cancer Maternal Grandmother     PE: BP 130/80   Pulse 62   Ht 5' 4" (1.626 m)   Wt 168 lb (76.2 kg)   SpO2 97%   BMI 28.84 kg/m  Wt Readings from Last 3 Encounters:  10/22/19 168 lb (76.2 kg)   07/26/19 158 lb 2 oz (71.7 kg)  06/08/19 151 lb 12.8 oz (68.9 kg)   Constitutional: overweight, in NAD Eyes: PERRLA, EOMI, no exophthalmos, no lid lag, no stare ENT: moist mucous membranes, no thyromegaly, no thyroid bruits, no cervical lymphadenopathy Cardiovascular: RRR, No MRG Respiratory: CTA B Gastrointestinal: abdomen soft, NT, ND, BS+ Musculoskeletal: no deformities, strength intact in all 4 Skin: moist, warm, no rashes Neurological: no tremor with outstretched hands, DTR normal in all 4  ASSESSMENT: 1. Thyrotoxicosis  2.  Elevated alk phos  3.  Elevated PTH  4. DM2, non-insulin-dependent  PLAN:  1. Patient with a recently found low TSH, which was not associated with thyrotoxic sxs: weight loss, heat intolerance, hyperdefecation, palpitations.  However, patient is poor historian and it is difficult to obtain a clear history of her symptoms.  Does have anxiety, but she tells me that this is related to her teenage granddaughter living with her. - she does not appear to have exogenous causes for the low TSH.  - We discussed that possible causes of thyrotoxicosis are:  Marland Kitchen Graves ds   . Thyroiditis . toxic multinodular goiter/ toxic adenoma (I cannot feel nodules at palpation of her thyroid). - will check the TSH, fT3 and fT4 and also add thyroid stimulating antibodies to screen for Graves' disease.  - If the tests remain abnormal, we may need an uptake and scan to differentiate between the 3 above possible etiologies  - we discussed about possible modalities of treatment for the above conditions, to include methimazole use, radioactive iodine ablation or (last resort) surgery.  She was already started on methimazole.  She feels fatigued and gained weight and she would like to reduce the dose to once a day.  We can do this today but I advised her date we may need to increase it back if the tests are abnormal.  I did advise her that it is possible that her fatigue and weight gain is  actually related to metoprolol,since the dose was recently doubled.  Her pulse today is low, 62.  I advised her to discuss with her cardiologist to see if the dose can be decreased back to once a day. - we may need to do thyroid ultrasound depending on the results of the uptake and scan (if a cold nodule is present) - no signs of Graves' ophthalmopathy: she does not have double vision, blurry vision, eye pain, chemosis. - RTC in 3 months, but likely sooner for repeat labs  2.  Elevated alk phos -This is possible in the setting of thyrotoxicosis -we will need to recheck the level after she becomes euthyroid -However, this can also be caused by a low vitamin D-we will check this today  3.  Elevated PTH -In the setting of normal or low calcium, indicative of secondary PTH.  I am wondering if this can be related to a possible vitamin deficiency or her kidney disease -However, after thyrotoxicosis is resolved, we will need to investigate her further  4. DM2, non-insulin-dependent -Well-controlled, latest HbA1c 5.8% 2 mo ago -continue Januvia, which she tolerates well -We will repeat HbA1c at next visit   Component     Latest Ref Rng & Units 10/22/2019  TSH     0.35 - 4.50 uIU/mL 1.11  T4,Free(Direct)     0.60 - 1.60 ng/dL 0.57 (L)  Triiodothyronine,Free,Serum     2.3 - 4.2 pg/mL 2.4  TSI     <140 % baseline  377 (H)  Vitamin D, 25-Hydroxy     30.0 - 100.0 ng/mL 13.7 (L)   TSH is now normal while free T4 is slightly low.  We can safely decrease the dose of methimazole to only once a day and recheck her thyroid test in 1.5 months.  Her TSI level is elevated, so this is most likely Graves' disease. Her vitamin D level is very low, which may account for the elevated PTH.  Will recommend to start vitamin D 4000 units daily I will recheck her level when she returns to the clinic in 3 months.  Philemon Kingdom, MD PhD [facility] Endocrinology

## 2019-10-23 ENCOUNTER — Telehealth: Payer: Self-pay

## 2019-10-23 LAB — VITAMIN D 25 HYDROXY (VIT D DEFICIENCY, FRACTURES): Vit D, 25-Hydroxy: 13.7 ng/mL — ABNORMAL LOW (ref 30.0–100.0)

## 2019-10-23 NOTE — Telephone Encounter (Signed)
Pt called to inquire why labs have not been faxed to PCP.  I called PCP's office to confirm that all labs have been received and pt is aware.

## 2019-10-23 NOTE — Telephone Encounter (Signed)
Noted.  Will hold for all results to come in.

## 2019-10-23 NOTE — Telephone Encounter (Signed)
-----   Message from Estanislado Emms sent at 10/23/2019  7:46 AM EDT ----- Regarding: labs Per patient request please fax copy  of labs to Royann Shivers, MD.  Phone number is [phone].

## 2019-10-25 LAB — THYROID STIMULATING IMMUNOGLOBULIN: TSI: 377 % baseline — ABNORMAL HIGH (ref <140)

## 2019-10-25 MED ORDER — METHIMAZOLE 5 MG PO TABS: 5.0000 mg | ORAL_TABLET | Freq: Every day | ORAL | 3 refills | Status: AC

## 2019-10-25 NOTE — Telephone Encounter (Signed)
-----   Message from Carlus Pavlov, MD sent at 10/25/2019  4:26 PM EDT ----- Efraim Kaufmann, can you please call pt: TSH is now normal while free T4 is slightly low.  We can safely decrease the dose of methimazole to only once a day (I already advised her to do so at last visit so she can continue this dose) and recheck her thyroid test in 1.5 months (laboratory).  Her TSI level is elevated, so the most likely diagnosis is Graves' disease. Her vitamin D level is very low.  Will recommend to start vitamin D 4000 units daily I will recheck her level when she returns to the clinic in 3 months.

## 2019-10-30 NOTE — Telephone Encounter (Signed)
Notified patient of message from Dr. Elvera Lennox, patient expressed understanding and agreement. No further questions.  Patient states she takes 5000 unit Vitamin D at night.

## 2019-11-04 ENCOUNTER — Ambulatory Visit: Payer: Medicare HMO | Admitting: Podiatry

## 2019-11-04 ENCOUNTER — Encounter: Payer: Self-pay | Admitting: Podiatry

## 2019-11-04 ENCOUNTER — Other Ambulatory Visit: Payer: Self-pay

## 2019-11-04 DIAGNOSIS — E119 Type 2 diabetes mellitus without complications: Secondary | ICD-10-CM

## 2019-11-04 DIAGNOSIS — Q828 Other specified congenital malformations of skin: Secondary | ICD-10-CM | POA: Diagnosis not present

## 2019-11-04 NOTE — Patient Instructions (Addendum)
Brooks sneakers Horseshoe felt pads, non-medicated on [employer]  Diabetes Mellitus and [place] care is an important part of your health, especially when you have diabetes. Diabetes may cause you to have problems because of poor blood flow (circulation) to your feet and legs, which can cause your skin to:  Become thinner and drier.  Break more easily.  Heal more slowly.  Peel and crack. You may also have nerve damage (neuropathy) in your legs and feet, causing decreased feeling in them. This means that you may not notice minor injuries to your feet that could lead to more serious problems. Noticing and addressing any potential problems early is the best way to prevent future foot problems. How to care for your feet Foot hygiene  Wash your feet daily with warm water and mild soap. Do not use hot water. Then, pat your feet and the areas between your toes until they are completely dry. Do not soak your feet as this can dry your skin.  Trim your toenails straight across. Do not dig under them or around the cuticle. File the edges of your nails with an emery board or nail file.  Apply a moisturizing lotion or petroleum jelly to the skin on your feet and to dry, brittle toenails. Use lotion that does not contain alcohol and is unscented. Do not apply lotion between your toes. Shoes and socks  Wear clean socks or stockings every day. Make sure they are not too tight. Do not wear knee-high stockings since they may decrease blood flow to your legs.  Wear shoes that fit properly and have enough cushioning. Always look in your shoes before you put them on to be sure there are no objects inside.  To break in new shoes, wear them for just a few hours a day. This prevents injuries on your feet. Wounds, scrapes, corns, and calluses  Check your feet daily for blisters, cuts, bruises, sores, and redness. If you cannot see the bottom of your feet, use a mirror or ask someone for help.  Do not cut  corns or calluses or try to remove them with medicine.  If you find a minor scrape, cut, or break in the skin on your feet, keep it and the skin around it clean and dry. You may clean these areas with mild soap and water. Do not clean the area with peroxide, alcohol, or iodine.  If you have a wound, scrape, corn, or callus on your foot, look at it several times a day to make sure it is healing and not infected. Check for: ? Redness, swelling, or pain. ? Fluid or blood. ? Warmth. ? Pus or a bad smell. General instructions  Do not cross your legs. This may decrease blood flow to your feet.  Do not use heating pads or hot water bottles on your feet. They may burn your skin. If you have lost feeling in your feet or legs, you may not know this is happening until it is too late.  Protect your feet from hot and cold by wearing shoes, such as at the beach or on hot pavement.  Schedule a complete foot exam at least once a year (annually) or more often if you have foot problems. If you have foot problems, report any cuts, sores, or bruises to your health care provider immediately. Contact a health care provider if:  You have a medical condition that increases your risk of infection and you have any cuts, sores, or bruises on your  feet.  You have an injury that is not healing.  You have redness on your legs or feet.  You feel burning or tingling in your legs or feet.  You have pain or cramps in your legs and feet.  Your legs or feet are numb.  Your feet always feel cold.  You have pain around a toenail. Get help right away if:  You have a wound, scrape, corn, or callus on your foot and: ? You have pain, swelling, or redness that gets worse. ? You have fluid or blood coming from the wound, scrape, corn, or callus. ? Your wound, scrape, corn, or callus feels warm to the touch. ? You have pus or a bad smell coming from the wound, scrape, corn, or callus. ? You have a fever. ? You have a  red line going up your leg. Summary  Check your feet every day for cuts, sores, red spots, swelling, and blisters.  Moisturize feet and legs daily.  Wear shoes that fit properly and have enough cushioning.  If you have foot problems, report any cuts, sores, or bruises to your health care provider immediately.  Schedule a complete foot exam at least once a year (annually) or more often if you have foot problems. This information is not intended to replace advice given to you by your health care provider. Make sure you discuss any questions you have with your health care provider. Document Revised: 02/20/2019 Document Reviewed: 07/01/2016 Elsevier Patient Education  2020 [employer]   Corns and Calluses Corns are small areas of thickened skin that occur on the top, sides, or tip of a toe. They contain a cone-shaped core with a point that can press on a nerve below. This causes pain.  Calluses are areas of thickened skin that can occur anywhere on the body, including the hands, fingers, palms, soles of the feet, and heels. Calluses are usually larger than corns. What are the causes? Corns and calluses are caused by rubbing (friction) or pressure, such as from shoes that are too tight or do not fit properly. What increases the risk? Corns are more likely to develop in people who have misshapen toes (toe deformities), such as hammer toes. Calluses can occur with friction to any area of the skin. They are more likely to develop in people who:  Work with their hands.  Wear shoes that fit poorly, are too tight, or are high-heeled.  Have toe deformities. What are the signs or symptoms? Symptoms of a corn or callus include:  A hard growth on the skin.  Pain or tenderness under the skin.  Redness and swelling.  Increased discomfort while wearing tight-fitting shoes, if your feet are affected. If a corn or callus becomes infected, symptoms may include:  Redness and swelling that  gets worse.  Pain.  Fluid, blood, or pus draining from the corn or callus. How is this diagnosed? Corns and calluses may be diagnosed based on your symptoms, your medical history, and a physical exam. How is this treated? Treatment for corns and calluses may include:  Removing the cause of the friction or pressure. This may involve: ? Changing your shoes. ? Wearing shoe inserts (orthotics) or other protective layers in your shoes, such as a corn pad. ? Wearing gloves.  Applying medicine to the skin (topical medicine) to help soften skin in the hardened, thickened areas.  Removing layers of dead skin with a file to reduce the size of the corn or callus.  Removing the  corn or callus with a scalpel or laser.  Taking antibiotic medicines, if your corn or callus is infected.  Having surgery, if a toe deformity is the cause. Follow these instructions at home:   Take over-the-counter and prescription medicines only as told by your health care provider.  If you were prescribed an antibiotic, take it as told by your health care provider. Do not stop taking it even if your condition starts to improve.  Wear shoes that fit well. Avoid wearing high-heeled shoes and shoes that are too tight or too loose.  Wear any padding, protective layers, gloves, or orthotics as told by your health care provider.  Soak your hands or feet and then use a file or pumice stone to soften your corn or callus. Do this as told by your health care provider.  Check your corn or callus every day for symptoms of infection. Contact a health care provider if you:  Notice that your symptoms do not improve with treatment.  Have redness or swelling that gets worse.  Notice that your corn or callus becomes painful.  Have fluid, blood, or pus coming from your corn or callus.  Have new symptoms. Summary  Corns are small areas of thickened skin that occur on the top, sides, or tip of a toe.  Calluses are areas  of thickened skin that can occur anywhere on the body, including the hands, fingers, palms, and soles of the feet. Calluses are usually larger than corns.  Corns and calluses are caused by rubbing (friction) or pressure, such as from shoes that are too tight or do not fit properly.  Treatment may include wearing any padding, protective layers, gloves, or orthotics as told by your health care provider. This information is not intended to replace advice given to you by your health care provider. Make sure you discuss any questions you have with your health care provider. Document Revised: 09/19/2018 Document Reviewed: 04/12/2017 Elsevier Patient Education  2020 [employer].

## 2019-11-09 NOTE — Progress Notes (Signed)
Subjective: Brooke Davidson presents today preventative diabetic foot care and painful callus(es) b/l feet. Aggravating factors include weightbearing with and without shoe gear. Pain is relieved with periodic professional debridement.   Patient states she is eating healthy foods, lots of vegetables and avoiding fried food and sweets. She states she is not diabetic.  Brooke Ables, MD is patient's PCP. She is followed by Endocrinologist, Dr. Lafe Davidson, for her diabetes which is documented in visit for 10/22/2019. Currently taking Januvia.   Past Medical History:  Diagnosis Date  . Anemia    in the past  . Asthma    uses Symbicort daily and ALbuterol as needed  . Chronic back pain   . Complication of anesthesia    slow to wake up  . Constipation   . Contact dermatitis due to chemicals   . Cough    nonproductive and without fever  . Depression    but doesn't take any meds   . Diabetes mellitus without complication (HCC)    takes Metformin daily  . Eczema   . Fall during current hospitalization 12/30/2015 12/30/2015  . GERD (gastroesophageal reflux disease)    doesn't take any meds  . Glaucoma    uses eye drops daily  . Heart murmur    states it was told to her as a younger woman, no problems that she knows   . History of bronchitis a yr ago  . HLD (hyperlipidemia)    takes Pravastatin daily  . Hypertension    takes Losartan-HCTZ daily  . Insomnia    takes Ambien nightly   . Joint pain   . Joint swelling   . Muscle spasm    takes Baclofen daily as needed  . Osteoarthritis    knees  . Primary localized osteoarthritis of left knee   . Primary localized osteoarthritis of right knee 12/17/2015  . Pulmonary nodule   . Rupture of right patellar tendon 12/31/2015   Patient fell the second evening after total knee replacement and suffer and open patella tendon rupture of her right knee.  Surgically repaired after an extensive irrigation and debridement on 12/30/2015  . s/p  right total knee replacement on 12/28/2015 12/28/2015  . Urinary frequency   . Urinary urgency      Current Outpatient Medications on File Prior to Visit  Medication Sig Dispense Refill  . acetaminophen (TYLENOL) 500 MG tablet Take 1,000 mg by mouth at bedtime.     Marland Kitchen albuterol (PROVENTIL HFA;VENTOLIN HFA) 108 (90 Base) MCG/ACT inhaler Inhale 2 puffs into the lungs every 6 (six) hours as needed for wheezing or shortness of breath. 8 g 0  . apixaban (ELIQUIS) 5 MG TABS tablet Take 5 mg by mouth 2 (two) times daily.    . budesonide-formoterol (SYMBICORT) 80-4.5 MCG/ACT inhaler Inhale 2 puffs into the lungs 2 (two) times daily. 1 Inhaler 0  . cetirizine (ZYRTEC) 10 MG tablet Take 1 tablet (10 mg total) by mouth daily. 90 tablet 0  . fluticasone (FLONASE) 50 MCG/ACT nasal spray     . latanoprost (XALATAN) 0.005 % ophthalmic solution Place 1 drop into both eyes at bedtime.   12  . losartan (COZAAR) 100 MG tablet     . losartan-hydrochlorothiazide (HYZAAR) 50-12.5 MG tablet Take 1 tablet by mouth daily.   2  . LUMIGAN 0.01 % SOLN     . methimazole (TAPAZOLE) 5 MG tablet Take 1 tablet (5 mg total) by mouth daily. 45 tablet 3  . metoprolol succinate (TOPROL-XL) 25  MG 24 hr tablet Take 2 tablets (50 mg total) by mouth daily. 30 tablet 0  . mometasone-formoterol (DULERA) 100-5 MCG/ACT AERO Inhale into the lungs.    Marland Kitchen olopatadine (PATADAY) 0.1 % ophthalmic solution Place 1 drop into both eyes as needed for allergies.    . pravastatin (PRAVACHOL) 80 MG tablet Take 80 mg by mouth at bedtime.     . propafenone (RYTHMOL) 150 MG tablet     . sitaGLIPtin (JANUVIA) 100 MG tablet Take 100 mg by mouth daily.    Marland Kitchen triamcinolone ointment (KENALOG) 0.1 %     . zolpidem (AMBIEN) 10 MG tablet Take 10 mg by mouth at bedtime as needed for sleep.     No current facility-administered medications on file prior to visit.     Allergies  Allergen Reactions  . Bactrim [Sulfamethoxazole-Trimethoprim] Swelling    Tongue  swells  . Ivp Dye [Iodinated Diagnostic Agents] Hives  . Clonazepam     dizziness  . Sulfa Antibiotics Swelling  . Codeine Itching  . Other Itching and Rash    Perfume  . Vicodin [Hydrocodone-Acetaminophen] Itching    Objective: Brooke Davidson is a pleasant 74 y.o. y.o. Patient Race: Black or African American [2]  female in NAD. AAO x 3.  There were no vitals filed for this visit.  Vascular Examination: Neurovascular status unchanged b/l. Capillary refill time to digits immediate b/l. Palpable DP pulses b/l. Palpable PT pulses b/l. Pedal hair present b/l. Skin temperature gradient within normal limits b/l. No edema noted b/l.  Dermatological Examination: Pedal skin with normal turgor, texture and tone bilaterally. No open wounds bilaterally. No interdigital macerations bilaterally. Porokeratotic lesion(s) L hallux, R hallux, submet head 1 left foot, submet head 1 right foot, submet head 5 left foot and submet head 5 right foot. No erythema, no edema, no drainage, no flocculence.  Toenails 1-5 b/l well maintained with adequate length.   Musculoskeletal: Normal muscle strength 5/5 to all lower extremity muscle groups bilaterally. No gross bony deformities bilaterally. No pain crepitus or joint limitation noted with ROM b/l. Patient ambulates independent of any assistive aids.  Neurological Examination: Protective sensation intact 5/5 intact bilaterally with 10g monofilament b/l. Proprioception intact bilaterally.  Assessment: 1. Porokeratosis   2. Controlled type 2 diabetes mellitus without complication, without long-term current use of insulin ([place])   Plan: -Examined patient. -Continue diabetic foot care principles. Literature dispensed on today.  -Callus(es) L hallux, R hallux, submet head 1 left foot, submet head 1 right foot, submet head 5 left foot and submet head 5 right foot pared utilizing sterile scalpel blade without complication or incident. Total number debrided  =6. -Patient to continue soft, supportive shoe gear daily. -Patient to report any pedal injuries to medical professional immediately. -Patient/POA to call should there be question/concern in the interim.  Return in about 3 months (around 02/04/2020) for diabetic callus trim.  Marzetta Board, DPM

## 2019-11-12 ENCOUNTER — Encounter: Payer: Self-pay | Admitting: Gastroenterology

## 2019-11-12 ENCOUNTER — Other Ambulatory Visit: Payer: Self-pay | Admitting: Family Medicine

## 2019-11-12 DIAGNOSIS — Z13820 Encounter for screening for osteoporosis: Secondary | ICD-10-CM

## 2019-12-27 ENCOUNTER — Telehealth: Payer: Self-pay | Admitting: Podiatry

## 2019-12-27 NOTE — Telephone Encounter (Signed)
Pt wanted to states that she is only borederline diabetic and that she did not want anyone scared to do her rfc

## 2020-01-06 ENCOUNTER — Telehealth: Payer: Self-pay | Admitting: *Deleted

## 2020-01-06 NOTE — Telephone Encounter (Signed)
If she is stable from cardiopulmonary standpoint and no other major changes to her health since the last time she was seen I think okay to proceed at the [facility] if she can get clearance to hold the Eliquis. I see a negative nuclear stress reported per [facility] and [facility] from November which looked okay. Thanks

## 2020-01-06 NOTE — Telephone Encounter (Signed)
Dr. Adela Lank,  I have a couple of questions about this pt.  She saw Jill Side in the office 07-26-19.  She was to have an Endo/colon.  Per Dr. Myrtie Neither, we are to check with the MD if it has been greater than 90 days since the pt had their OV- if they have not had their scheduled procedure. Are you ok to proceed as scheduled or would you like a repeat OV?  Also, she is on Eliquis so we will need to get clearance for that  Thanks, Baxter Hire

## 2020-01-07 ENCOUNTER — Other Ambulatory Visit: Payer: Self-pay | Admitting: *Deleted

## 2020-01-07 NOTE — Telephone Encounter (Signed)
Referral faxed to Dr. Willeen Cass at [phone] at [facility]

## 2020-01-07 NOTE — Telephone Encounter (Signed)
Please request approval to hold Eliquis. Thanks

## 2020-01-14 NOTE — Telephone Encounter (Signed)
Yes it is on the scheduling notes for her procedures.  Hold for 2 days

## 2020-01-14 NOTE — Telephone Encounter (Signed)
Sheri,  Have you heard anything about this pt's Eliquis clearance?  Her PV is tomorrow.  Thanks, [employer]

## 2020-01-15 ENCOUNTER — Encounter: Payer: Self-pay | Admitting: Pulmonary Disease

## 2020-01-15 ENCOUNTER — Ambulatory Visit (INDEPENDENT_AMBULATORY_CARE_PROVIDER_SITE_OTHER): Payer: Medicare HMO

## 2020-01-15 ENCOUNTER — Other Ambulatory Visit: Payer: Self-pay

## 2020-01-15 ENCOUNTER — Ambulatory Visit: Payer: Medicare HMO | Admitting: Pulmonary Disease

## 2020-01-15 VITALS — BP 120/78 | HR 88 | Temp 98.0°F | Ht 64.0 in | Wt 167.8 lb

## 2020-01-15 DIAGNOSIS — J4520 Mild intermittent asthma, uncomplicated: Secondary | ICD-10-CM | POA: Diagnosis not present

## 2020-01-15 DIAGNOSIS — I10 Essential (primary) hypertension: Secondary | ICD-10-CM

## 2020-01-15 DIAGNOSIS — R0600 Dyspnea, unspecified: Secondary | ICD-10-CM

## 2020-01-15 DIAGNOSIS — R0609 Other forms of dyspnea: Secondary | ICD-10-CM

## 2020-01-15 LAB — CBC WITH DIFFERENTIAL/PLATELET
Basophils Absolute: 0.1 10*3/uL (ref 0.0–0.1)
Basophils Relative: 0.9 % (ref 0.0–3.0)
Eosinophils Absolute: 0.2 10*3/uL (ref 0.0–0.7)
Eosinophils Relative: 3.6 % (ref 0.0–5.0)
HCT: 34.2 % — ABNORMAL LOW (ref 36.0–46.0)
Hemoglobin: 11.1 g/dL — ABNORMAL LOW (ref 12.0–15.0)
Lymphocytes Relative: 38.6 % (ref 12.0–46.0)
Lymphs Abs: 2.6 10*3/uL (ref 0.7–4.0)
MCHC: 32.5 g/dL (ref 30.0–36.0)
MCV: 85.1 fl (ref 78.0–100.0)
Monocytes Absolute: 0.9 10*3/uL (ref 0.1–1.0)
Monocytes Relative: 14.1 % — ABNORMAL HIGH (ref 3.0–12.0)
Neutro Abs: 2.8 10*3/uL (ref 1.4–7.7)
Neutrophils Relative %: 42.8 % — ABNORMAL LOW (ref 43.0–77.0)
Platelets: 259 10*3/uL (ref 150.0–400.0)
RBC: 4.02 Mil/uL (ref 3.87–5.11)
RDW: 14.6 % (ref 11.5–15.5)
WBC: 6.6 10*3/uL (ref 4.0–10.5)

## 2020-01-15 NOTE — Patient Instructions (Signed)
We will get a chest x-ray Check some labs including CBC differential, IgE Schedule pulmonary function test for evaluation of your lung Continue Symbicort inhaler Follow-up in 3 months

## 2020-01-15 NOTE — Progress Notes (Signed)
Brooke Davidson    [id number]    1945/08/03  Primary Care Physician:Okwubunka-Anyim, Harlene Ramus, MD  Referring Physician: [name], MD [address] [place],  [place] [address]  Chief complaint: Consult for cough  HPI: 74 year old with diabetes, hypertension, hyperlipidemia, asthma, atrial fibrillation, CKD, thyrotoxicosis Diagnosed with asthma many years ago.  Maintained on Symbicort, albuterol Complains of chronic cough  Reports an incident of inhalational injury when she mixed bleach pneumonia in her twenties and has intermittent cough since then.  No mucus production Asthma appears reasonably controlled.  Hardly needs to use her rescue inhaler.   Pets: No pets Occupation: Retired Oncologist Exposures: No ongoing exposures.  No mold, hot tub, Jacuzzi.  No feather pillows or comforters Smoking history: Minimal smoking in her twenties Travel history:Originally from [place] Relevant family history: No significant family history of lung disease   Outpatient Encounter Medications as of 01/15/2020  Medication Sig  . acetaminophen (TYLENOL) 500 MG tablet Take 1,000 mg by mouth at bedtime.   Marland Kitchen albuterol (PROVENTIL HFA;VENTOLIN HFA) 108 (90 Base) MCG/ACT inhaler Inhale 2 puffs into the lungs every 6 (six) hours as needed for wheezing or shortness of breath.  Marland Kitchen apixaban (ELIQUIS) 5 MG TABS tablet Take 5 mg by mouth 2 (two) times daily.  . budesonide-formoterol (SYMBICORT) 80-4.5 MCG/ACT inhaler Inhale 2 puffs into the lungs 2 (two) times daily.  . cetirizine (ZYRTEC) 10 MG tablet Take 1 tablet (10 mg total) by mouth daily.  . fluticasone (FLONASE) 50 MCG/ACT nasal spray   . latanoprost (XALATAN) 0.005 % ophthalmic solution Place 1 drop into both eyes at bedtime.   Marland Kitchen losartan (COZAAR) 100 MG tablet   . losartan-hydrochlorothiazide (HYZAAR) 50-12.5 MG tablet Take 1 tablet by mouth daily.   Marland Kitchen LUMIGAN 0.01 % SOLN   . methimazole (TAPAZOLE) 5 MG tablet  Take 1 tablet (5 mg total) by mouth daily.  . metoprolol succinate (TOPROL-XL) 25 MG 24 hr tablet Take 2 tablets (50 mg total) by mouth daily.  . mometasone-formoterol (DULERA) 100-5 MCG/ACT AERO Inhale into the lungs.  Marland Kitchen olopatadine (PATADAY) 0.1 % ophthalmic solution Place 1 drop into both eyes as needed for allergies.  . pravastatin (PRAVACHOL) 80 MG tablet Take 80 mg by mouth at bedtime.   . propafenone (RYTHMOL) 150 MG tablet   . sitaGLIPtin (JANUVIA) 100 MG tablet Take 100 mg by mouth daily.  Marland Kitchen triamcinolone ointment (KENALOG) 0.1 %   . zolpidem (AMBIEN) 10 MG tablet Take 10 mg by mouth at bedtime as needed for sleep.   No facility-administered encounter medications on file as of 01/15/2020.    Allergies as of 01/15/2020 - Review Complete 11/04/2019  Allergen Reaction Noted  . Bactrim [sulfamethoxazole-trimethoprim] Swelling 04/17/2014  . Ivp dye [iodinated diagnostic agents] Hives 04/17/2014  . Clonazepam  12/24/2014  . Sulfa antibiotics Swelling 10/03/2014  . Codeine Itching 04/17/2014  . Other Itching and Rash 12/14/2015  . Vicodin [hydrocodone-acetaminophen] Itching 04/17/2014    Past Medical History:  Diagnosis Date  . Anemia    in the past  . Asthma    uses Symbicort daily and ALbuterol as needed  . Chronic back pain   . Complication of anesthesia    slow to wake up  . Constipation   . Contact dermatitis due to chemicals   . Cough    nonproductive and without fever  . Depression    but doesn't take any meds   . Diabetes mellitus without complication (HCC)  takes Metformin daily  . Eczema   . Fall during current hospitalization 12/30/2015 12/30/2015  . GERD (gastroesophageal reflux disease)    doesn't take any meds  . Glaucoma    uses eye drops daily  . Heart murmur    states it was told to her as a younger woman, no problems that she knows   . History of bronchitis a yr ago  . HLD (hyperlipidemia)    takes Pravastatin daily  . Hypertension    takes  Losartan-HCTZ daily  . Insomnia    takes Ambien nightly   . Joint pain   . Joint swelling   . Muscle spasm    takes Baclofen daily as needed  . Osteoarthritis    knees  . Primary localized osteoarthritis of left knee   . Primary localized osteoarthritis of right knee 12/17/2015  . Pulmonary nodule   . Rupture of right patellar tendon 12/31/2015   Patient fell the second evening after total knee replacement and suffer and open patella tendon rupture of her right knee.  Surgically repaired after an extensive irrigation and debridement on 12/30/2015  . 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 Urinary frequency   . Urinary urgency     Past Surgical History:  Procedure Laterality Date  . GALLBLADDER SURGERY  1997  . IRRIGATION AND DEBRIDEMENT KNEE Right 12/30/2015   Procedure: IRRIGATION AND DEBRIDEMENT KNEE;  Surgeon: Salvatore Marvel, MD;  [facility] OR;  Service: Orthopedics;  Laterality: Right;  . PATELLAR TENDON REPAIR Right 12/30/2015   Procedure: PATELLA TENDON REPAIR;  Surgeon: Salvatore Marvel, MD;  Location: [facility] OR;  Service: Orthopedics;  Laterality: Right;  . TOTAL KNEE ARTHROPLASTY Left 01/05/2015   Procedure: TOTAL KNEE ARTHROPLASTY;  Surgeon: Salvatore Marvel, MD;  Location: [facility] OR;  Service: Orthopedics;  Laterality: Left;  . TOTAL KNEE ARTHROPLASTY Right 12/28/2015   Procedure: TOTAL KNEE ARTHROPLASTY;  Surgeon: Salvatore Marvel, MD;  Location: [facility] OR;  Service: Orthopedics;  Laterality: Right;    Family History  Problem Relation Age of Onset  . Heart attack Father   . Glaucoma Mother   . Diabetes Mellitus II Mother   . Hypertension Sister   . Cancer Maternal Grandmother   . Colon cancer Maternal Grandmother     Social History   Socioeconomic History  . Marital status: Divorced    Spouse name: Not on file  . Number of children: Not on file  . Years of education: Not on file  . Highest education level: Not on file  Occupational History  . Not on file    Tobacco Use  . Smoking status: Former Games developer  . Smokeless tobacco: Never Used  . Tobacco comment: quit smoking 57yrs ago  Vaping Use  . Vaping Use: Never used  Substance and Sexual Activity  . Alcohol use: No  . Drug use: No  . Sexual activity: Not on file  Other Topics Concern  . Not on file  Social History Narrative  . Not on file   Social Determinants of Health   Financial Resource Strain:   . Difficulty of Paying Living Expenses:   Food Insecurity:   . Worried About Programme researcher, broadcasting/film/video in the Last Year:   . Barista in the Last Year:   Transportation Needs:   . Freight forwarder (Medical):   Marland Kitchen Lack of Transportation (Non-Medical):   Physical Activity:   . Days of Exercise per Week:   . Minutes of Exercise  per Session:   Stress:   . Feeling of Stress :   Social Connections:   . Frequency of Communication with Friends and Family:   . Frequency of Social Gatherings with Friends and Family:   . Attends Religious Services:   . Active Member of Clubs or Organizations:   . Attends Banker Meetings:   Marland Kitchen Marital Status:   Intimate Partner Violence:   . Fear of Current or Ex-Partner:   . Emotionally Abused:   Marland Kitchen Physically Abused:   . Sexually Abused:     Review of systems: Review of Systems  Constitutional: Negative for fever and chills.  HENT: Negative.   Eyes: Negative for blurred vision.  Respiratory: as per HPI  Cardiovascular: Negative for chest pain and palpitations.  Gastrointestinal: Negative for vomiting, diarrhea, blood per rectum. Genitourinary: Negative for dysuria, urgency, frequency and hematuria.  Musculoskeletal: Negative for myalgias, back pain and joint pain.  Skin: Negative for itching and rash.  Neurological: Negative for dizziness, tremors, focal weakness, seizures and loss of consciousness.  Endo/Heme/Allergies: Negative for environmental allergies.  Psychiatric/Behavioral: Negative for depression, suicidal ideas and  hallucinations.  All other systems reviewed and are negative.  Physical Exam: Blood pressure 120/78, pulse 88, temperature 98 F (36.7 C), temperature source Oral, height 5\' 4"  (1.626 m), weight 167 lb 12.8 oz (76.1 kg), SpO2 98 %. Gen:      No acute distress HEENT:  EOMI, sclera anicteric Neck:     No masses; no thyromegaly Lungs:    Clear to auscultation bilaterally; normal respiratory effort CV:         Regular rate and rhythm; no murmurs Abd:      + bowel sounds; soft, non-tender; no palpable masses, no distension Ext:    No edema; adequate peripheral perfusion Skin:      Warm and dry; no rash Neuro: alert and oriented x 3 Psych: normal mood and affect  Data Reviewed: Imaging: CT abdomen pelvis 12/07/2015-calcified granuloma in the right lower lobe Chest x-ray 06/04/2019-calcified granuloma in the right lower lobe I have reviewed the images personally.  PFTs:   Labs: CBC 06/06/2019-WBC 6.4, eos 2%, absolute eosinophil count 128  Assessment:  Asthma, chronic cough Her lung imaging reviewed with no ILD or other abnormalities except for benign calcified granuloma in the right lower lobe Continue on Symbicort, albuterol Check CBC differential, IgE and PFTs Chest x-ray today  Plan/Recommendations: Continue inhalers CBC, IgE, chest x-ray, PFTs  06/08/2019 MD [facility] 01/15/2020, 9:16 AM  CC: 03/16/2020*

## 2020-01-16 LAB — IGE: IgE (Immunoglobulin E), Serum: 47 kU/L (ref <=114)

## 2020-01-22 ENCOUNTER — Telehealth: Payer: Self-pay | Admitting: Pulmonary Disease

## 2020-01-22 NOTE — Telephone Encounter (Signed)
Spoke with the pt  She is asking why someone wrote on her AVS that she needed to be covid tested  I advised it's b/c he ordered PFT and she has not been fully vaccinated yet  She verbalized understanding

## 2020-01-28 ENCOUNTER — Encounter: Payer: Medicare HMO | Admitting: Gastroenterology

## 2020-01-29 ENCOUNTER — Other Ambulatory Visit: Payer: Medicare HMO

## 2020-02-03 ENCOUNTER — Other Ambulatory Visit: Payer: Medicare HMO

## 2020-02-03 ENCOUNTER — Telehealth: Payer: Self-pay | Admitting: Pulmonary Disease

## 2020-02-03 NOTE — Telephone Encounter (Signed)
Error - pr  

## 2020-02-24 ENCOUNTER — Ambulatory Visit: Payer: Medicare HMO | Admitting: Internal Medicine

## 2020-03-02 ENCOUNTER — Telehealth: Payer: Self-pay | Admitting: *Deleted

## 2020-03-02 NOTE — Telephone Encounter (Signed)
Patient no showed PV today- Called patient and  Unable to leave message to return call by 5 pm today- no mail box set up to Leave message-  No answer  Attempted pt at 140 pm ( PV was 130 PM) - attempted pt at 1637 pm as well- no answer   PV and Procedure both canceled- No Show letter mailed to patient

## 2020-03-09 ENCOUNTER — Inpatient Hospital Stay (HOSPITAL_COMMUNITY): Admission: RE | Admit: 2020-03-09 | Payer: Medicare HMO | Source: Ambulatory Visit

## 2020-03-10 ENCOUNTER — Ambulatory Visit: Payer: Medicare HMO | Admitting: Podiatry

## 2020-03-11 ENCOUNTER — Ambulatory Visit: Payer: Medicare HMO | Admitting: Primary Care

## 2020-03-11 NOTE — Progress Notes (Deleted)
@Patient  ID: , female    DOB: 04/05/1946, 74 y.o.   MRN: [id number]  No chief complaint on file.   Referring provider: [id number]*  HPI: 74 year old female, former smoker. PMH significant for asthma, HTN, afib, DM, CKD, schizoaffective disorder. Patient of Dr. 65, last seen on 01/15/20. Ordered for CXR, labs and PFTs. Maintained on Symbicort 80 two puffs twice daily.   03/11/2020 Patient presents today for 2 months follow-up with PFTs.     Imaging: 01/15/20 CXR- Lungs well aerated bilaterally. No acute abnormality.   Labs: 01/15/20- IgE 47, eos absolute 200   Allergies  Allergen Reactions  . Bactrim [Sulfamethoxazole-Trimethoprim] Swelling    Tongue swells  . Ivp Dye [Iodinated Diagnostic Agents] Hives  . Clonazepam     dizziness  . Sulfa Antibiotics Swelling  . Codeine Itching  . Other Itching and Rash    Perfume  . Vicodin [Hydrocodone-Acetaminophen] Itching    Immunization History  Administered Date(s) Administered  . Influenza-Unspecified 04/30/2019  . PFIZER SARS-COV-2 Vaccination 11/19/2019    Past Medical History:  Diagnosis Date  . Anemia    in the past  . Asthma    uses Symbicort daily and ALbuterol as needed  . Chronic back pain   . Complication of anesthesia    slow to wake up  . Constipation   . Contact dermatitis due to chemicals   . Cough    nonproductive and without fever  . Depression    but doesn't take any meds   . Diabetes mellitus without complication (HCC)    takes Metformin daily  . Eczema   . Fall during current hospitalization 12/30/2015 12/30/2015  . GERD (gastroesophageal reflux disease)    doesn't take any meds  . Glaucoma    uses eye drops daily  . Heart murmur    states it was told to her as a younger woman, no problems that she knows   . History of bronchitis a yr ago  . HLD (hyperlipidemia)    takes Pravastatin daily  . Hypertension    takes Losartan-HCTZ daily  . Insomnia    takes  Ambien nightly   . Joint pain   . Joint swelling   . Muscle spasm    takes Baclofen daily as needed  . Osteoarthritis    knees  . Primary localized osteoarthritis of left knee   . Primary localized osteoarthritis of right knee 12/17/2015  . Pulmonary nodule   . Rupture of right patellar tendon 12/31/2015   Patient fell the second evening after total knee replacement and suffer and open patella tendon rupture of her right knee.  Surgically repaired after an extensive irrigation and debridement on 12/30/2015  . 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 Urinary frequency   . Urinary urgency     Tobacco History: Social History   Tobacco Use  Smoking Status Former Smoker  Smokeless Tobacco Never Used  Tobacco Comment   quit smoking 63yrs ago   Counseling given: Not Answered Comment: quit smoking 34yrs ago   Outpatient Medications Prior to Visit  Medication Sig Dispense Refill  . acetaminophen (TYLENOL) 500 MG tablet Take 1,000 mg by mouth at bedtime.     22yr albuterol (PROVENTIL HFA;VENTOLIN HFA) 108 (90 Base) MCG/ACT inhaler Inhale 2 puffs into the lungs every 6 (six) hours as needed for wheezing or shortness of breath. 8 g 0  . apixaban (ELIQUIS) 5 MG TABS tablet Take 5 mg by mouth  2 (two) times daily.    . budesonide-formoterol (SYMBICORT) 80-4.5 MCG/ACT inhaler Inhale 2 puffs into the lungs 2 (two) times daily. 1 Inhaler 0  . cetirizine (ZYRTEC) 10 MG tablet Take 1 tablet (10 mg total) by mouth daily. 90 tablet 0  . fluticasone (FLONASE) 50 MCG/ACT nasal spray     . latanoprost (XALATAN) 0.005 % ophthalmic solution Place 1 drop into both eyes at bedtime.   12  . losartan (COZAAR) 100 MG tablet     . losartan-hydrochlorothiazide (HYZAAR) 50-12.5 MG tablet Take 1 tablet by mouth daily.   2  . LUMIGAN 0.01 % SOLN     . methimazole (TAPAZOLE) 5 MG tablet Take 1 tablet (5 mg total) by mouth daily. 45 tablet 3  . metoprolol succinate (TOPROL-XL) 25 MG 24 hr tablet Take  2 tablets (50 mg total) by mouth daily. 30 tablet 0  . mometasone-formoterol (DULERA) 100-5 MCG/ACT AERO Inhale into the lungs.    Marland Kitchen olopatadine (PATADAY) 0.1 % ophthalmic solution Place 1 drop into both eyes as needed for allergies.    . pravastatin (PRAVACHOL) 80 MG tablet Take 80 mg by mouth at bedtime.     . propafenone (RYTHMOL) 150 MG tablet     . sitaGLIPtin (JANUVIA) 100 MG tablet Take 100 mg by mouth daily.    Marland Kitchen triamcinolone ointment (KENALOG) 0.1 %     . zolpidem (AMBIEN) 10 MG tablet Take 10 mg by mouth at bedtime as needed for sleep.     No facility-administered medications prior to visit.      Review of Systems  Review of Systems   Physical Exam  There were no vitals taken for this visit. Physical Exam   Lab Results:  CBC    Component Value Date/Time   WBC 6.6 01/15/2020 1005   RBC 4.02 01/15/2020 1005   HGB 11.1 (L) 01/15/2020 1005   HCT 34.2 (L) 01/15/2020 1005   PLT 259.0 01/15/2020 1005   MCV 85.1 01/15/2020 1005   MCH 26.8 06/08/2019 0534   MCHC 32.5 01/15/2020 1005   RDW 14.6 01/15/2020 1005   LYMPHSABS 2.6 01/15/2020 1005   MONOABS 0.9 01/15/2020 1005   EOSABS 0.2 01/15/2020 1005   BASOSABS 0.1 01/15/2020 1005    BMET    Component Value Date/Time   NA 138 07/26/2019 1219   K 4.7 07/26/2019 1219   CL 103 07/26/2019 1219   CO2 28 07/26/2019 1219   GLUCOSE 98 07/26/2019 1219   BUN 18 07/26/2019 1219   CREATININE 1.07 07/26/2019 1219   CALCIUM 9.2 07/26/2019 1219   GFRNONAA 51 (L) 06/08/2019 0534   GFRAA 59 (L) 06/08/2019 0534    BNP No results found for: BNP  ProBNP No results found for: PROBNP  Imaging: No results found.   Assessment & Plan:   No problem-specific Assessment & Plan notes found for this encounter.     Glenford Bayley, NP 03/11/2020

## 2020-03-13 ENCOUNTER — Encounter: Payer: Medicare HMO | Admitting: Gastroenterology

## 2020-04-08 ENCOUNTER — Telehealth: Payer: Self-pay | Admitting: Internal Medicine

## 2020-04-08 NOTE — Telephone Encounter (Signed)
Called [facility] requesting last OV note 10/2019---faxed  last OV to [phone].

## 2020-04-08 NOTE — Telephone Encounter (Signed)
[facility] requesting visit notes from last two visit - stated the only visit from May 2021 would be ok if that is all we have.  Fax to [phone]

## 2020-04-09 ENCOUNTER — Telehealth: Payer: Self-pay | Admitting: Pulmonary Disease

## 2020-04-09 NOTE — Telephone Encounter (Signed)
Called and spoke with patient who states that she has had a dry cough since getting her covid vaccines one in August and the other in October. She is not sure if its related to her vaccines or something else. Patient has been prescribed Tesslon from PCP and Cetirizine and has been taking them as prescribed. She states that when she is hot it doesn't bother her only she she is cold in the winter or when air is blowing directly in her face.  Patient was advised at last visit to follow up in 3 months patient is now scheduled for OV and PFT on 05/22/20. Advised patient to try OTC Delsym for cough as well.  Dr. Isaiah Serge please advise with any other recommendations

## 2020-04-10 NOTE — Telephone Encounter (Signed)
Called and spoke with pt letting her know the info stated by Dr. Mannam and she verbalized understanding. Nothing further needed. 

## 2020-04-10 NOTE — Telephone Encounter (Signed)
Over-the-counter Delsym is fine for now We will review PFTs and discuss in detail at return visit.

## 2020-05-06 ENCOUNTER — Other Ambulatory Visit: Payer: Medicare HMO

## 2020-05-22 ENCOUNTER — Ambulatory Visit: Payer: Medicare HMO | Admitting: Pulmonary Disease

## 2020-09-18 ENCOUNTER — Other Ambulatory Visit: Payer: Self-pay | Admitting: Family Medicine

## 2020-09-18 DIAGNOSIS — Z1231 Encounter for screening mammogram for malignant neoplasm of breast: Secondary | ICD-10-CM

## 2020-10-01 ENCOUNTER — Inpatient Hospital Stay: Admission: RE | Admit: 2020-10-01 | Payer: Medicare HMO | Source: Ambulatory Visit

## 2020-11-05 ENCOUNTER — Encounter: Payer: Self-pay | Admitting: General Practice

## 2020-11-19 ENCOUNTER — Telehealth: Payer: Self-pay | Admitting: Podiatry

## 2020-11-19 NOTE — Telephone Encounter (Signed)
Patient stated that she is not a diabetic and that she will bring paper work from primary care

## 2020-11-20 ENCOUNTER — Encounter: Payer: Medicare HMO | Admitting: Obstetrics & Gynecology

## 2020-11-20 ENCOUNTER — Telehealth: Payer: Self-pay | Admitting: General Practice

## 2020-11-20 NOTE — Telephone Encounter (Signed)
Left message on VM for patient to contact our office to reschedule appointment.  Pt left message with after hours staff requesting to cancel appointment and to reschedule due to transportation issues.

## 2020-11-24 ENCOUNTER — Ambulatory Visit: Payer: Medicare HMO

## 2020-12-01 ENCOUNTER — Ambulatory Visit: Payer: Medicare HMO | Admitting: Podiatry

## 2020-12-01 ENCOUNTER — Other Ambulatory Visit: Payer: Self-pay

## 2020-12-01 DIAGNOSIS — Z7901 Long term (current) use of anticoagulants: Secondary | ICD-10-CM | POA: Diagnosis not present

## 2020-12-01 DIAGNOSIS — L84 Corns and callosities: Secondary | ICD-10-CM

## 2020-12-01 DIAGNOSIS — R52 Pain, unspecified: Secondary | ICD-10-CM

## 2020-12-03 ENCOUNTER — Encounter: Payer: Self-pay | Admitting: Podiatry

## 2020-12-03 NOTE — Progress Notes (Signed)
Left without being seen.

## 2020-12-03 NOTE — Progress Notes (Signed)
  Subjective:  Patient ID: Brooke Davidson, female    DOB: 06-03-1946,  MRN: [id number]  Chief Complaint  Patient presents with   Callouses    Bilateral callus treatment requested. Pt states on her last visit her calluses were not shaved.     75 y.o. female presents with the above complaint. History confirmed with patient.  This is a recurrent issue for her that has been very painful.  She is not diabetic she would like this to be made clear and removed from her chart  Objective:  Physical Exam: warm, good capillary refill, no trophic changes or ulcerative lesions, normal DP and PT pulses, and normal sensory exam.  Painful hyperkeratotic lesions about 1 and 5 as well as the medial hallux bilateral Assessment:   1. Callus of foot   2. Pain   3. Long term (current) use of anticoagulants      Plan:  Patient was evaluated and treated and all questions answered.  All symptomatic hyperkeratoses were safely debrided with a sterile #15 blade to patient's level of comfort without incident. We discussed preventative and palliative care of these lesions including supportive and accommodative shoegear, padding, prefabricated and custom molded accommodative orthoses, use of a pumice stone and lotions/creams daily.   Return if symptoms worsen or fail to improve.

## 2020-12-22 ENCOUNTER — Encounter: Payer: Medicare HMO | Admitting: Family Medicine

## 2020-12-24 ENCOUNTER — Other Ambulatory Visit: Payer: Self-pay

## 2020-12-24 ENCOUNTER — Encounter: Payer: Self-pay | Admitting: Obstetrics and Gynecology

## 2020-12-24 ENCOUNTER — Ambulatory Visit (INDEPENDENT_AMBULATORY_CARE_PROVIDER_SITE_OTHER): Payer: Medicare HMO | Admitting: Obstetrics and Gynecology

## 2020-12-24 ENCOUNTER — Other Ambulatory Visit (HOSPITAL_COMMUNITY)
Admission: RE | Admit: 2020-12-24 | Discharge: 2020-12-24 | Disposition: A | Discharge status: Home / Self Care | Payer: Medicare HMO | Source: Ambulatory Visit | Attending: Family Medicine | Admitting: Family Medicine

## 2020-12-24 VITALS — BP 157/88 | HR 61 | Ht 63.0 in | Wt 163.6 lb

## 2020-12-24 DIAGNOSIS — Z113 Encounter for screening for infections with a predominantly sexual mode of transmission: Secondary | ICD-10-CM | POA: Diagnosis not present

## 2020-12-24 DIAGNOSIS — Q649 Congenital malformation of urinary system, unspecified: Secondary | ICD-10-CM | POA: Diagnosis not present

## 2020-12-24 MED ORDER — ESTRADIOL 0.1 MG/GM VA CREA: TOPICAL_CREAM | VAGINAL | 12 refills | Status: DC

## 2020-12-24 NOTE — Progress Notes (Signed)
Pt states had vaginal bump but not currently there. Is not painful.

## 2020-12-24 NOTE — Progress Notes (Signed)
75 yo P2 presenting today for the evaluation of vaginal bumps intermittently. Patient reports that bump is not present today and typically disappears with heat application. Patient is not currently sexually active. She denies pelvic pain or abnormal discharge. She denies any episodes of postmenopausal vaginal bleeding. She endorses vaginal dryness at times. Patient desires STI testing  Past Medical History:  Diagnosis Date   Anemia    in the past   Asthma    uses Symbicort daily and ALbuterol as needed   Chronic back pain    Complication of anesthesia    slow to wake up   Constipation    Contact dermatitis due to chemicals    Cough    nonproductive and without fever   Depression    but doesn't take any meds    Diabetes mellitus without complication (HCC)    takes Metformin daily   Eczema    Fall during current hospitalization 12/30/2015 12/30/2015   GERD (gastroesophageal reflux disease)    doesn't take any meds   Glaucoma    uses eye drops daily   Heart murmur    states it was told to her as a younger woman, no problems that she knows    History of bronchitis a yr ago   HLD (hyperlipidemia)    takes Pravastatin daily   Hypertension    takes Losartan-HCTZ daily   Insomnia    takes Ambien nightly    Joint pain    Joint swelling    Muscle spasm    takes Baclofen daily as needed   Osteoarthritis    knees   Primary localized osteoarthritis of left knee    Primary localized osteoarthritis of right knee 12/17/2015   Pulmonary nodule    Rupture of right patellar tendon 12/31/2015   Patient fell the second evening after total knee replacement and suffer and open patella tendon rupture of her right knee.  Surgically repaired after an extensive irrigation and debridement on 12/30/2015   s/p right total knee replacement on 12/28/2015 12/28/2015   Urinary frequency    Urinary urgency    Past Surgical History:  Procedure Laterality Date   GALLBLADDER SURGERY  1997   IRRIGATION AND  DEBRIDEMENT KNEE Right 12/30/2015   Procedure: IRRIGATION AND DEBRIDEMENT KNEE;  Surgeon: Salvatore Marvel, MD;  Location: [facility] OR;  Service: Orthopedics;  Laterality: Right;   PATELLAR TENDON REPAIR Right 12/30/2015   Procedure: PATELLA TENDON REPAIR;  Surgeon: Salvatore Marvel, MD;  Location: [facility] OR;  Service: Orthopedics;  Laterality: Right;   TOTAL KNEE ARTHROPLASTY Left 01/05/2015   Procedure: TOTAL KNEE ARTHROPLASTY;  Surgeon: Salvatore Marvel, MD;  Location: [facility] OR;  Service: Orthopedics;  Laterality: Left;   TOTAL KNEE ARTHROPLASTY Right 12/28/2015   Procedure: TOTAL KNEE ARTHROPLASTY;  Surgeon: Salvatore Marvel, MD;  Location: [facility] OR;  Service: Orthopedics;  Laterality: Right;   Family History  Problem Relation Age of Onset   Heart attack Father    Glaucoma Mother    Diabetes Mellitus II Mother    Hypertension Sister    Cancer Maternal Grandmother    Colon cancer Maternal Grandmother    Social History   Tobacco Use   Smoking status: Former   Smokeless tobacco: Never   Tobacco comments:    quit smoking 41yrs ago  Vaping Use   Vaping Use: Never used  Substance Use Topics   Alcohol use: No   Drug use: No   ROS See pertinent in HPI. All other systems reviewed and non  contributory Blood pressure (!) 157/88, pulse 61, height 5\' 3"  (1.6 m), weight 163 lb 9.6 oz (74.2 kg). GENERAL: Well-developed, well-nourished female in no acute distress.  HEENT: Normocephalic, atraumatic. Sclerae anicteric.  NECK: Supple. Normal thyroid.  LUNGS: Clear to auscultation bilaterally.  HEART: Regular rate and rhythm. BREASTS: Symmetric in size. No palpable masses or lymphadenopathy, skin changes, or nipple drainage. ABDOMEN: Soft, nontender, nondistended. No organomegaly. PELVIC: Normal external female genitalia. Vagina is pink and rugated.  Normal discharge. Normal appearing cervix. Uterus is normal in size.  No adnexal mass or tenderness. EXTREMITIES: No cyanosis, clubbing, or edema, 2+ distal pulses.  A/P  75 yo with normal vulva exam - Reassurance provided- possible bartholin inflammation - STI testing per patient request - Rx estrace cream provided - Patient referred to urogynecology per her request for evaluation of urinary flow abnormality - Patient will be contacted with abnormal results - RTC prn

## 2020-12-25 ENCOUNTER — Telehealth: Payer: Self-pay

## 2020-12-25 LAB — CERVICOVAGINAL ANCILLARY ONLY
Bacterial Vaginitis (gardnerella): POSITIVE — AB
Candida Glabrata: NEGATIVE
Candida Vaginitis: POSITIVE — AB
Chlamydia: NEGATIVE
Comment: NEGATIVE
Comment: NEGATIVE
Comment: NEGATIVE
Comment: NEGATIVE
Comment: NEGATIVE
Comment: NORMAL
Neisseria Gonorrhea: NEGATIVE
Trichomonas: NEGATIVE

## 2020-12-25 LAB — RPR: RPR Ser Ql: NONREACTIVE

## 2020-12-25 LAB — HEPATITIS B SURFACE ANTIGEN: Hepatitis B Surface Ag: NEGATIVE

## 2020-12-25 LAB — HEPATITIS C ANTIBODY: Hep C Virus Ab: <0.1 s/co ratio (ref 0.0–0.9)

## 2020-12-25 LAB — HIV ANTIBODY (ROUTINE TESTING W REFLEX): HIV Screen 4th Generation wRfx: NONREACTIVE

## 2020-12-25 MED ORDER — METRONIDAZOLE 500 MG PO TABS: 500.0000 mg | ORAL_TABLET | Freq: Two times a day (BID) | ORAL | 0 refills | Status: AC

## 2020-12-25 MED ORDER — FLUCONAZOLE 150 MG PO TABS: 150.0000 mg | ORAL_TABLET | Freq: Once | ORAL | 3 refills | Status: AC

## 2020-12-25 NOTE — Telephone Encounter (Signed)
-----   Message from Catalina Antigua, MD sent at 12/25/2020 11:53 AM EDT ----- Please inform patient of all normal labs. However she has both a yeast and bacterial vaginosis infection. A prescription was sent to her pharmacy  Thanks

## 2020-12-25 NOTE — Addendum Note (Signed)
Addended by: Catalina Antigua on: 12/25/2020 11:53 AM   Modules accepted: Orders

## 2020-12-25 NOTE — Telephone Encounter (Signed)
Call placed to pt to inform of results from 12/24/20.  Judeth Cornfield, RN

## 2020-12-28 ENCOUNTER — Telehealth: Payer: Self-pay

## 2020-12-29 NOTE — Telephone Encounter (Signed)
Error

## 2021-01-26 ENCOUNTER — Ambulatory Visit: Payer: Medicare HMO | Admitting: Internal Medicine

## 2021-01-26 NOTE — Progress Notes (Deleted)
Patient ID: Brooke Davidson, female   DOB: 17-Sep-1945, 75 y.o.   MRN: [id number]   This visit occurred during the SARS-CoV-2 public health emergency.  Safety protocols were in place, including screening questions prior to the visit, additional usage of staff PPE, and extensive cleaning of exam room while observing appropriate contact time as indicated for disinfecting solutions.   HPI  Brooke Davidson is a 75 y.o.-year-old female, initially referred by her PCP, Buzzy Han, MD, returning for follow-up for Graves' disease, elevated parathyroid hormone, and DM2.  Last visit 1 year and 3 months ago.  Thyrotoxicosis: She was found to have an abnormal tests in 08/2019 by Dr. Terrence Dupont. She was started on methimazole 5 mg 2x daily in 08/2019 by cardiology.  Also, her metoprolol dose was increased to twice a day recently.  She started to feel fatigue and gained weight since her medication changes.  In 10/2019, I advised her to decrease methimazole to 5 mg daily.  She was lost for follow-up afterwards.  Reviewed her TFTs: Lab Results  Component Value Date   TSH 1.11 10/22/2019   FREET4 0.57 (L) 10/22/2019   T3FREE 2.4 10/22/2019   Antithyroid antibodies: Lab Results  Component Value Date   TSI 377 (H) 10/22/2019     Pt denies: - feeling nodules in neck - hoarseness - dysphagia - choking - SOB with lying down  Pt denies: - weight loss - heat intolerance - tremors - palpitations - anxiety - hyperdefecation - hair loss  She has anxiety, chronic.  Pt does not have a FH of thyroid ds. No FH of thyroid cancer. No h/o radiation tx to head or neck. No recent contrast studies. No herbal supplements. No Biotin use.  No Prednisone, Amiodarone.  Elevated alkaline phosphatase:  Reviewed levels: Lab Results  Component Value Date   ALKPHOS 169 (H) 08/26/2019   ALKPHOS 130 (H) 07/26/2019   ALKPHOS 92 12/17/2015   ALKPHOS 86 12/07/2015   ALKPHOS 86 11/20/2015   ALKPHOS 81  12/30/2014   No recent fractures or bone disease.  No known cancer or bone metastasis.  No known liver disease.  Hyperparathyroidism:  Reviewed pertinent labs: 09/09/2019: Calcium 9.4 (8.6-10.4), iPTH 125 (14-64) No results found for: PTH  Lab Results  Component Value Date   CALCIUM 9.2 07/26/2019   CALCIUM 9.2 06/08/2019   CALCIUM 9.5 06/07/2019   CALCIUM 9.6 06/06/2019   CALCIUM 9.3 06/04/2019   CALCIUM 8.4 (L) 12/31/2015   CALCIUM 8.9 12/30/2015   CALCIUM 8.7 (L) 12/29/2015   CALCIUM 9.4 12/17/2015   CALCIUM 9.6 12/07/2015   CALCIUM 9.7 11/20/2015   CALCIUM 8.4 (L) 01/07/2015   CALCIUM 8.7 (L) 01/06/2015   CALCIUM 9.0 12/30/2014   CALCIUM 9.4 08/24/2014   At last visit she had significant vitamin D deficiency: Lab Results  Component Value Date   VD25OH 13.7 (L) 10/22/2019  After the above results returned, I advised him to start 4000 units vitamin D daily.  No history of kidney stones.  DM2:  This is well controlled.  Reviewed HbA1c levels: 09/09/2019: HbA1c 5.8% Lab Results  Component Value Date   HGBA1C 6.1 (H) 06/06/2019   HGBA1C 5.7 (H) 11/20/2015   HGBA1C 5.9 (H) 01/05/2015   She is on: - Januvia  100 mg before b'fast  She does have a Glucocom glucometer.  + Mild CKD; most recent BUN/creatinine: 09/09/2019: 26/1.42, GFR 43, ACR 6 She is on losartan 100 mg daily.  ? HL. No results found for: CHOL  No results found for: HDL No results found for: LDLCALC No results found for: TRIG No results found for: CHOLHDL No results found for: LDLDIRECT She is on pravastatin 80 mg daily.  Also, on ASA 81, and Eliquis.  ROS: + See HPI Past Medical History:  Diagnosis Date   Anemia    in the past   Asthma    uses Symbicort daily and ALbuterol as needed   Chronic back pain    Complication of anesthesia    slow to wake up   Constipation    Contact dermatitis due to chemicals    Cough    nonproductive and without fever   Depression    but doesn't take  any meds    Diabetes mellitus without complication (HCC)    takes Metformin daily   Eczema    Fall during current hospitalization 12/30/2015 12/30/2015   GERD (gastroesophageal reflux disease)    doesn't take any meds   Glaucoma    uses eye drops daily   Heart murmur    states it was told to her as a younger woman, no problems that she knows    History of bronchitis a yr ago   HLD (hyperlipidemia)    takes Pravastatin daily   Hypertension    takes Losartan-HCTZ daily   Insomnia    takes Ambien nightly    Joint pain    Joint swelling    Muscle spasm    takes Baclofen daily as needed   Osteoarthritis    knees   Primary localized osteoarthritis of left knee    Primary localized osteoarthritis of right knee 12/17/2015   Pulmonary nodule    Rupture of right patellar tendon 12/31/2015   Patient fell the second evening after total knee replacement and suffer and open patella tendon rupture of her right knee.  Surgically repaired after an extensive irrigation and debridement on 12/30/2015   s/p right total knee replacement on 12/28/2015 12/28/2015   Urinary frequency    Urinary urgency    Past Surgical History:  Procedure Laterality Date   [place] Right 12/30/2015   Procedure: IRRIGATION AND DEBRIDEMENT KNEE;  Surgeon: Elsie Saas, MD;  Location: [place];  Service: Orthopedics;  Laterality: Right;   PATELLAR TENDON REPAIR Right 12/30/2015   Procedure: PATELLA TENDON REPAIR;  Surgeon: Elsie Saas, MD;  Location: [place];  Service: Orthopedics;  Laterality: Right;   TOTAL KNEE ARTHROPLASTY Left 01/05/2015   Procedure: TOTAL KNEE ARTHROPLASTY;  Surgeon: Elsie Saas, MD;  Location: [place];  Service: Orthopedics;  Laterality: Left;   TOTAL KNEE ARTHROPLASTY Right 12/28/2015   Procedure: TOTAL KNEE ARTHROPLASTY;  Surgeon: Elsie Saas, MD;  Location: [place];  Service: Orthopedics;  Laterality: Right;   Social History   Socioeconomic History    Marital status: Divorced    Spouse name: Not on file   Number of children: Not on file   Years of education: Not on file   Highest education level: Not on file  Occupational History   Not on file  Tobacco Use   Smoking status: Former   Smokeless tobacco: Never   Tobacco comments:    quit smoking 82yrs ago  Vaping Use   Vaping Use: Never used  Substance and Sexual Activity   Alcohol use: No   Drug use: No   Sexual activity: Not Currently    Birth control/protection: None  Other Topics Concern   Not on file  Social  History Narrative   Not on file   Social Determinants of Health   Financial Resource Strain: Not on file  Food Insecurity: Not on file  Transportation Needs: Not on file  Physical Activity: Not on file  Stress: Not on file  Social Connections: Not on file  Intimate Partner Violence: Not on file   Current Outpatient Medications on File Prior to Visit  Medication Sig Dispense Refill   albuterol (PROVENTIL HFA;VENTOLIN HFA) 108 (90 Base) MCG/ACT inhaler Inhale 2 puffs into the lungs every 6 (six) hours as needed for wheezing or shortness of breath. 8 g 0   apixaban (ELIQUIS) 5 MG TABS tablet Take 5 mg by mouth 2 (two) times daily.     cetirizine (ZYRTEC) 10 MG tablet Take 1 tablet (10 mg total) by mouth daily. 90 tablet 0   estradiol (ESTRACE) 0.1 MG/GM vaginal cream Apply 1 gram per vagina every night for 2 weeks, then apply three times a week 30 g 12   fluticasone (FLONASE) 50 MCG/ACT nasal spray      hydrOXYzine (ATARAX/VISTARIL) 25 MG tablet Take 25 mg by mouth at bedtime as needed.     latanoprost (XALATAN) 0.005 % ophthalmic solution Place 1 drop into both eyes at bedtime.   12   losartan (COZAAR) 100 MG tablet      losartan-hydrochlorothiazide (HYZAAR) 100-12.5 MG tablet      LUMIGAN 0.01 % SOLN      methimazole (TAPAZOLE) 5 MG tablet Take 1 tablet (5 mg total) by mouth daily. 45 tablet 3   metoprolol succinate (TOPROL-XL) 25 MG 24 hr tablet Take 2 tablets  (50 mg total) by mouth daily. 30 tablet 0   metroNIDAZOLE (FLAGYL) 500 MG tablet Take 1 tablet (500 mg total) by mouth 2 (two) times daily. 14 tablet 0   mometasone-formoterol (DULERA) 100-5 MCG/ACT AERO Inhale into the lungs.     olopatadine (PATANOL) 0.1 % ophthalmic solution Place 1 drop into both eyes as needed for allergies.     pravastatin (PRAVACHOL) 80 MG tablet Take 80 mg by mouth at bedtime.      propafenone (RYTHMOL) 150 MG tablet      sitaGLIPtin (JANUVIA) 100 MG tablet Take 100 mg by mouth daily.     SYMBICORT 160-4.5 MCG/ACT inhaler Inhale 2 puffs into the lungs 2 (two) times daily.     triamcinolone ointment (KENALOG) 0.1 %      zolpidem (AMBIEN) 10 MG tablet Take 10 mg by mouth at bedtime as needed for sleep.     No current facility-administered medications on file prior to visit.   Allergies  Allergen Reactions   Bactrim [Sulfamethoxazole-Trimethoprim] Swelling    Tongue swells   Ivp Dye [Iodinated Diagnostic Agents] Hives   Clonazepam     dizziness   Sulfa Antibiotics Swelling   Codeine Itching   Other Itching and Rash    Perfume   Vicodin [Hydrocodone-Acetaminophen] Itching   Family History  Problem Relation Age of Onset   Heart attack Father    Glaucoma Mother    Diabetes Mellitus II Mother    Hypertension Sister    Cancer Maternal Grandmother    Colon cancer Maternal Grandmother     PE: There were no vitals taken for this visit. Wt Readings from Last 3 Encounters:  12/24/20 163 lb 9.6 oz (74.2 kg)  01/15/20 167 lb 12.8 oz (76.1 kg)  10/22/19 168 lb (76.2 kg)   Constitutional: overweight, in NAD Eyes: PERRLA, EOMI, no exophthalmos, no lid lag,  no stare ENT: moist mucous membranes, no thyromegaly, no thyroid bruits, no cervical lymphadenopathy Cardiovascular: RRR, No MRG Respiratory: CTA B Gastrointestinal: abdomen soft, NT, ND, BS+ Musculoskeletal: no deformities, strength intact in all 4 Skin: moist, warm, no rashes Neurological: no tremor with  outstretched hands, DTR normal in all 4  ASSESSMENT: 1. Thyrotoxicosis  2.  Elevated alk phos  3.  Elevated PTH  4. DM2, non-insulin-dependent  PLAN:  1. Patient with a history of low TSH found before our visit from 10/2019.  Of note, this was not associated with thyrotoxic symptoms: Weight loss, heat intolerance, hyper defecation, palpitations.  However, patient was a poor historian and was difficult to obtain a clear history of her symptoms.  She did have anxiety but she was telling me that this was related to her teenage granddaughter living with her.   -At last visit, her TSI's were elevated pointing towards Graves' disease as a reason for her thyrotoxicosis.  -At last visit, she was on methimazole 5 mg twice a day but since her TSH was normal and free T4 was slightly low, we decreased the dose to 5 mg once a day and advised her to return for labs in 1.5 months.  Also, her pulse was low at last visit, I 62 and I advised her to discuss with her cardiologist to see if her metoprolol could be decreased back to once a day.  She was lost for follow-up afterwards, now returning 1 year and 3 months after the previous appointment. -At today's visit, she again has no symptoms of thyrotoxicosis.  She also has no tachycardia, high blood sugars or tremors on exam.  She only lost 5 pounds since last visit.  I cannot palpate a goiter or thyroid nodules. -At this visit, we will need to recheck her TFTs and adjust the methimazole dose accordingly.  We also discussed about definitive treatment for Graves' disease to include RAI ablation or, last resort, surgery.  For now opted to continue with his. -Of note, she has no signs of Graves' ophthalmopathy: No double vision, blurry vision, eye pain, chemosis -I will see her back in 3 months but possibly sooner for repeat labs.  We did discuss about possible consequences of missing appointments and being on methimazole for longer periods of time.  I strongly advised  her to not miss any lab or office visits.  2.  Elevated alk phos -This was most likely related to the low vitamin D and possibly also thyrotoxicosis -We will recheck this today  3.  Elevated PTH -In the setting of normal or low calcium was indicative of secondary PTH -Her vitamin D level was extremely low at last visit, at 13.7, most likely accounting for the high PTH -We will repeat her calcium and PTH today  4. DM2, non-insulin-dependent -Well-controlled, with the latest HbA1c from 09/09/2019: 5.8% -continue Januvia, which she tolerates well -We will recheck her HbA1c today.   Component     Latest Ref Rng & Units 10/22/2019  TSH     0.35 - 4.50 uIU/mL 1.11  T4,Free(Direct)     0.60 - 1.60 ng/dL 0.57 (L)  Triiodothyronine,Free,Serum     2.3 - 4.2 pg/mL 2.4  TSI     <140 % baseline 377 (H)  Vitamin D, 25-Hydroxy     30.0 - 100.0 ng/mL 13.7 (L)     Philemon Kingdom, MD PhD [facility] Endocrinology

## 2021-01-27 ENCOUNTER — Other Ambulatory Visit: Payer: Self-pay

## 2021-01-27 ENCOUNTER — Ambulatory Visit
Admission: RE | Admit: 2021-01-27 | Discharge: 2021-01-27 | Disposition: A | Discharge status: Home / Self Care | Payer: Medicare HMO | Source: Ambulatory Visit | Attending: Family Medicine | Admitting: Family Medicine

## 2021-01-27 DIAGNOSIS — Z1231 Encounter for screening mammogram for malignant neoplasm of breast: Secondary | ICD-10-CM

## 2021-01-28 ENCOUNTER — Encounter: Payer: Self-pay | Admitting: Internal Medicine

## 2021-01-28 ENCOUNTER — Ambulatory Visit (INDEPENDENT_AMBULATORY_CARE_PROVIDER_SITE_OTHER): Payer: Medicare HMO | Admitting: Internal Medicine

## 2021-01-28 VITALS — BP 120/62 | HR 61 | Ht 63.0 in | Wt 162.8 lb

## 2021-01-28 DIAGNOSIS — R748 Abnormal levels of other serum enzymes: Secondary | ICD-10-CM | POA: Diagnosis not present

## 2021-01-28 DIAGNOSIS — Z794 Long term (current) use of insulin: Secondary | ICD-10-CM | POA: Diagnosis not present

## 2021-01-28 DIAGNOSIS — N1831 Chronic kidney disease, stage 3a: Secondary | ICD-10-CM

## 2021-01-28 DIAGNOSIS — E559 Vitamin D deficiency, unspecified: Secondary | ICD-10-CM

## 2021-01-28 DIAGNOSIS — E213 Hyperparathyroidism, unspecified: Secondary | ICD-10-CM

## 2021-01-28 DIAGNOSIS — E1122 Type 2 diabetes mellitus with diabetic chronic kidney disease: Secondary | ICD-10-CM

## 2021-01-28 DIAGNOSIS — E0500 Thyrotoxicosis with diffuse goiter without thyrotoxic crisis or storm: Secondary | ICD-10-CM | POA: Diagnosis not present

## 2021-01-28 LAB — T4, FREE: Free T4: 0.74 ng/dL (ref 0.60–1.60)

## 2021-01-28 LAB — POCT GLYCOSYLATED HEMOGLOBIN (HGB A1C): Hemoglobin A1C: 5.8 % — AB (ref 4.0–5.6)

## 2021-01-28 LAB — VITAMIN D 25 HYDROXY (VIT D DEFICIENCY, FRACTURES): VITD: 21.85 ng/mL — ABNORMAL LOW (ref 30.00–100.00)

## 2021-01-28 LAB — T3, FREE: T3, Free: 2.6 pg/mL (ref 2.3–4.2)

## 2021-01-28 LAB — TSH: TSH: 1.28 u[IU]/mL (ref 0.35–5.50)

## 2021-01-28 LAB — ALKALINE PHOSPHATASE: Alkaline Phosphatase: 125 U/L — ABNORMAL HIGH (ref 39–117)

## 2021-01-28 NOTE — Progress Notes (Signed)
Patient ID: Brooke Davidson, female   DOB: 03-Feb-1946, 75 y.o.   MRN: [id number]   This visit occurred during the SARS-CoV-2 public health emergency.  Safety protocols were in place, including screening questions prior to the visit, additional usage of staff PPE, and extensive cleaning of exam room while observing appropriate contact time as indicated for disinfecting solutions.   HPI  Brooke Davidson is a 75 y.o.-year-old female, initially referred by her PCP, [name], MD, returning for follow-up for Graves' disease, elevated parathyroid hormone, and DM2.  Last visit 1 year and 3 months ago.  Interim history: She started HCTZ 12.5 since last OV >> BP improved.  She feels she gained weight. She feels more tired despite exercising at the gym. She has skin itching >> started Zyrtec. She otherwise feels well.  Thyrotoxicosis: She was found to have an abnormal tests in 08/2019 by Dr. Sharyn Lull. She was started on methimazole 5 mg 2x daily in 08/2019 by cardiology.  Also, her metoprolol dose was increased to twice a day before our last visit.  In 10/2019, I advised her to decrease methimazole to 5 mg daily.  She was lost for follow-up afterwards. At this visit, she is still taking it 2x a day...  Reviewed her TFTs: Lab Results  Component Value Date   TSH 1.11 10/22/2019   FREET4 0.57 (L) 10/22/2019   T3FREE 2.4 10/22/2019   Antithyroid antibodies: Lab Results  Component Value Date   TSI 377 (H) 10/22/2019   Pt denies: - feeling nodules in neck - hoarseness - dysphagia - SOB with lying down But she has choking.  Pt denies: - weight loss, in fact, she complains of some weight gain (not by our scale) - heat intolerance - tremors - palpitations - anxiety - hyperdefecation - hair loss  She has anxiety, chronic.  Pt does not have a FH of thyroid ds. No FH of thyroid cancer. No h/o radiation tx to head or neck. No recent contrast studies. No herbal supplements. No  Biotin use.  No Prednisone, Amiodarone.  Elevated alkaline phosphatase:  Reviewed levels: Lab Results  Component Value Date   ALKPHOS 169 (H) 08/26/2019   ALKPHOS 130 (H) 07/26/2019   ALKPHOS 92 12/17/2015   ALKPHOS 86 12/07/2015   ALKPHOS 86 11/20/2015   ALKPHOS 81 12/30/2014   No recent fractures or bone disease.  No known cancer or bone metastasis.  No known liver disease.  Hyperparathyroidism:  Reviewed pertinent labs: 09/09/2019: Calcium 9.4 (8.6-10.4), iPTH 125 (14-64) No results found for: PTH  Lab Results  Component Value Date   CALCIUM 9.2 07/26/2019   CALCIUM 9.2 06/08/2019   CALCIUM 9.5 06/07/2019   CALCIUM 9.6 06/06/2019   CALCIUM 9.3 06/04/2019   CALCIUM 8.4 (L) 12/31/2015   CALCIUM 8.9 12/30/2015   CALCIUM 8.7 (L) 12/29/2015   CALCIUM 9.4 12/17/2015   CALCIUM 9.6 12/07/2015   CALCIUM 9.7 11/20/2015   CALCIUM 8.4 (L) 01/07/2015   CALCIUM 8.7 (L) 01/06/2015   CALCIUM 9.0 12/30/2014   CALCIUM 9.4 08/24/2014   At last visit she had significant vitamin D deficiency: Lab Results  Component Value Date   VD25OH 13.7 (L) 10/22/2019  After the above results returned, I advised her to start 4000 units vitamin D daily. She does not remember this- not taking any vitamin D now.  No history of kidney stones.  DM2:  This is well controlled.  Reviewed HbA1c levels: 09/09/2019: HbA1c 5.8% Lab Results  Component Value Date  HGBA1C 6.1 (H) 06/06/2019   HGBA1C 5.7 (H) 11/20/2015   HGBA1C 5.9 (H) 01/05/2015   She is on: - Januvia  100 mg before b'fast  She does have a Glucocom glucometer.  + Mild CKD; most recent BUN/creatinine: 09/09/2019: 26/1.42, GFR 43, ACR 6 She is on losartan 100 mg daily.  ? HL. No results found for: CHOL No results found for: HDL No results found for: LDLCALC No results found for: TRIG No results found for: CHOLHDL No results found for: LDLDIRECT She is on pravastatin 80 mg daily.  Also, on ASA 81, and Eliquis.  ROS: + See  HPI   Past Medical History:  Diagnosis Date   Anemia    in the past   Asthma    uses Symbicort daily and ALbuterol as needed   Chronic back pain    Complication of anesthesia    slow to wake up   Constipation    Contact dermatitis due to chemicals    Cough    nonproductive and without fever   Depression    but doesn't take any meds    Diabetes mellitus without complication (HCC)    takes Metformin daily   Eczema    Fall during current hospitalization 12/30/2015 12/30/2015   GERD (gastroesophageal reflux disease)    doesn't take any meds   Glaucoma    uses eye drops daily   Heart murmur    states it was told to her as a younger woman, no problems that she knows    History of bronchitis a yr ago   HLD (hyperlipidemia)    takes Pravastatin daily   Hypertension    takes Losartan-HCTZ daily   Insomnia    takes Ambien nightly    Joint pain    Joint swelling    Muscle spasm    takes Baclofen daily as needed   Osteoarthritis    knees   Primary localized osteoarthritis of left knee    Primary localized osteoarthritis of right knee 12/17/2015   Pulmonary nodule    Rupture of right patellar tendon 12/31/2015   Patient fell the second evening after total knee replacement and suffer and open patella tendon rupture of her right knee.  Surgically repaired after an extensive irrigation and debridement on 12/30/2015   s/p right total knee replacement on 12/28/2015 12/28/2015   Urinary frequency    Urinary urgency    Past Surgical History:  Procedure Laterality Date   [place] Right 12/30/2015   Procedure: IRRIGATION AND DEBRIDEMENT KNEE;  Surgeon: Elsie Saas, MD;  Location: [place];  Service: Orthopedics;  Laterality: Right;   PATELLAR TENDON REPAIR Right 12/30/2015   Procedure: PATELLA TENDON REPAIR;  Surgeon: Elsie Saas, MD;  Location: [place];  Service: Orthopedics;  Laterality: Right;   TOTAL KNEE ARTHROPLASTY Left 01/05/2015    Procedure: TOTAL KNEE ARTHROPLASTY;  Surgeon: Elsie Saas, MD;  Location: [place];  Service: Orthopedics;  Laterality: Left;   TOTAL KNEE ARTHROPLASTY Right 12/28/2015   Procedure: TOTAL KNEE ARTHROPLASTY;  Surgeon: Elsie Saas, MD;  Location: [place];  Service: Orthopedics;  Laterality: Right;   Social History   Socioeconomic History   Marital status: Divorced    Spouse name: Not on file   Number of children: Not on file   Years of education: Not on file   Highest education level: Not on file  Occupational History   Not on file  Tobacco Use   Smoking  status: Former   Smokeless tobacco: Never   Tobacco comments:    quit smoking 36yrs ago  Vaping Use   Vaping Use: Never used  Substance and Sexual Activity   Alcohol use: No   Drug use: No   Sexual activity: Not Currently    Birth control/protection: None  Other Topics Concern   Not on file  Social History Narrative   Not on file   Social Determinants of Health   Financial Resource Strain: Not on file  Food Insecurity: Not on file  Transportation Needs: Not on file  Physical Activity: Not on file  Stress: Not on file  Social Connections: Not on file  Intimate Partner Violence: Not on file   Current Outpatient Medications on File Prior to Visit  Medication Sig Dispense Refill   albuterol (PROVENTIL HFA;VENTOLIN HFA) 108 (90 Base) MCG/ACT inhaler Inhale 2 puffs into the lungs every 6 (six) hours as needed for wheezing or shortness of breath. 8 g 0   apixaban (ELIQUIS) 5 MG TABS tablet Take 5 mg by mouth 2 (two) times daily.     cetirizine (ZYRTEC) 10 MG tablet Take 1 tablet (10 mg total) by mouth daily. 90 tablet 0   estradiol (ESTRACE) 0.1 MG/GM vaginal cream Apply 1 gram per vagina every night for 2 weeks, then apply three times a week 30 g 12   fluticasone (FLONASE) 50 MCG/ACT nasal spray      hydrOXYzine (ATARAX/VISTARIL) 25 MG tablet Take 25 mg by mouth at bedtime as needed.     latanoprost (XALATAN) 0.005 % ophthalmic  solution Place 1 drop into both eyes at bedtime.   12   losartan (COZAAR) 100 MG tablet      losartan-hydrochlorothiazide (HYZAAR) 100-12.5 MG tablet      LUMIGAN 0.01 % SOLN      methimazole (TAPAZOLE) 5 MG tablet Take 1 tablet (5 mg total) by mouth daily. 45 tablet 3   metoprolol succinate (TOPROL-XL) 25 MG 24 hr tablet Take 2 tablets (50 mg total) by mouth daily. 30 tablet 0   metroNIDAZOLE (FLAGYL) 500 MG tablet Take 1 tablet (500 mg total) by mouth 2 (two) times daily. 14 tablet 0   mometasone-formoterol (DULERA) 100-5 MCG/ACT AERO Inhale into the lungs.     olopatadine (PATANOL) 0.1 % ophthalmic solution Place 1 drop into both eyes as needed for allergies.     pravastatin (PRAVACHOL) 80 MG tablet Take 80 mg by mouth at bedtime.      propafenone (RYTHMOL) 150 MG tablet      sitaGLIPtin (JANUVIA) 100 MG tablet Take 100 mg by mouth daily.     SYMBICORT 160-4.5 MCG/ACT inhaler Inhale 2 puffs into the lungs 2 (two) times daily.     triamcinolone ointment (KENALOG) 0.1 %      zolpidem (AMBIEN) 10 MG tablet Take 10 mg by mouth at bedtime as needed for sleep.     No current facility-administered medications on file prior to visit.   Allergies  Allergen Reactions   Bactrim [Sulfamethoxazole-Trimethoprim] Swelling    Tongue swells   Ivp Dye [Iodinated Diagnostic Agents] Hives   Clonazepam     dizziness   Sulfa Antibiotics Swelling   Codeine Itching   Other Itching and Rash    Perfume   Vicodin [Hydrocodone-Acetaminophen] Itching   Family History  Problem Relation Age of Onset   Heart attack Father    Glaucoma Mother    Diabetes Mellitus II Mother    Hypertension Sister  Cancer Maternal Grandmother    Colon cancer Maternal Grandmother     PE: BP 120/62 (BP Location: Right Arm, Patient Position: Sitting, Cuff Size: Normal)   Pulse 61   Ht 5\' 3"  (1.6 m)   Wt 162 lb 12.8 oz (73.8 kg)   SpO2 97%   BMI 28.84 kg/m  Wt Readings from Last 3 Encounters:  01/28/21 162 lb 12.8 oz  (73.8 kg)  12/24/20 163 lb 9.6 oz (74.2 kg)  01/15/20 167 lb 12.8 oz (76.1 kg)   Constitutional: overweight, in NAD Eyes: PERRLA, EOMI, no exophthalmos, no lid lag, no stare ENT: moist mucous membranes, no thyromegaly, no thyroid bruits, no cervical lymphadenopathy Cardiovascular: RRR, No RG, 1/6 SEM Respiratory: CTA B Gastrointestinal: abdomen soft, NT, ND, BS+ Musculoskeletal: no deformities, strength intact in all 4 Skin: moist, warm, no rashes Neurological: no tremor with outstretched hands, DTR normal in all 4  ASSESSMENT: 1. Thyrotoxicosis  2.  Elevated alk phos  3.  Elevated PTH  4. DM2, non-insulin-dependent  PLAN:  1. Patient with a history of a low TSH from before our visit from 10/2019.  Of note, this was not associated with thyrotoxic symptoms: Weight loss, heat intolerance, hyper defecation, palpitations.  However, patient was a poor historian and it was difficult to obtain a clear history of her symptoms.  She did have anxiety but she was telling me that this was related to her teenage granddaughter living with her at the time. -At last visit, her TSI's were elevated pointing towards her Graves' disease diagnosis -At that time, she was on methimazole 5 mg twice a day but since her TSH returned normal and the free T4 was slightly low, I advised her to decrease the dose to 5 mg once a day and advised her to return for labs in 1.5 months.  At this visit, unfortunately, she tells me that she does not remember this, and she continues methimazole twice a day.  Also, her pulse was low at last visit, at 62 and I advised her to discuss with her cardiologist to see if her metoprolol could be decreased back to once a day.  She was lost for follow-up afterwards, now returning 1 year and 3 months after the previous appointment. -At today's visit, she again has no symptoms of thyrotoxicosis: No tachycardia, high blood pressure, or tremors on exam.  She only lost few pounds since last  visit.  I cannot palpate a goiter or thyroid nodules -At this visit, we will need to recheck her TFTs and adjust the methimazole dose accordingly.  We also discussed about definitive treatment for Graves' disease, to include RAI ablation, or, last resort, surgery.  For now, we opted to continue methimazole. -She has no signs of Graves' ophthalmopathy: No double vision, blurry vision, eye pain, chemosis -I will see her back in 3 months, but possibly sooner for repeat labs.  We did discuss about possible consequences of missing appointments and being on methimazole for longer periods of time.  I strongly advised her not to miss any lab or office visits going forward.  2.  Elevated alk phos -This was most likely related to her low vitamin D and possibly also thyrotoxicosis -We will recheck this today-she is fasting.  3.  Elevated PTH -In the setting of normal or low calcium, this was indicative of secondary hyperparathyroidism -At last visit, her vitamin D level was extremely low, most likely accounting for her high PTH. -We will repeat her calcium and PTH today  4.  Vitamin D deficiency -Vitamin D level was very low at last visit, 13.7 -We called her after the above results to start on 4000 units vitamin D daily.  She forgot and she is now not taking any vitamin D... -We will recheck the level today  5. DM2, non-insulin-dependent -Well-controlled, with the latest HbA1c from 09/09/2019 being 5.8% -She continues on Januvia, which she tolerates well -HbA1c today was well controlled, at 5.8%  Component     Latest Ref Rng & Units 01/28/2021  Calcium     8.7 - 10.3 mg/dL 9.2  PTH, Intact     15 - 65 pg/mL 161 (H)  PTH Interp      Comment  Alkaline Phosphatase     39 - 117 U/L 125 (H)  TSH     0.35 - 5.50 uIU/mL 1.28  T4,Free(Direct)     0.60 - 1.60 ng/dL 0.74  Triiodothyronine,Free,Serum     2.3 - 4.2 pg/mL 2.6  VITD     30.00 - 100.00 ng/mL 21.85 (L)   TFTs are excellent now, so for  now we will continue the current dose of methimazole, 5 mg twice daily. Vitamin D is low, which can also be the reason why her PTH is high.  I will advise her to restart 4000 units daily and we will recheck her vitamin D at next visit.  Philemon Kingdom, MD PhD [facility]

## 2021-01-28 NOTE — Patient Instructions (Addendum)
Please stop at the lab.  Continue methimazole 5 mg 2x daily.  Please come back for a follow-up appointment in 4 months.

## 2021-01-29 LAB — PTH, INTACT AND CALCIUM
Calcium: 9.2 mg/dL (ref 8.7–10.3)
PTH: 161 pg/mL — ABNORMAL HIGH (ref 15–65)

## 2021-02-01 ENCOUNTER — Telehealth: Payer: Self-pay | Admitting: Internal Medicine

## 2021-02-01 NOTE — Telephone Encounter (Signed)
Pt called and wants to know which Vit D should she get.

## 2021-02-03 NOTE — Telephone Encounter (Signed)
LVM--to call the office back regarding Vit. D questions.

## 2021-02-03 NOTE — Telephone Encounter (Signed)
Notified pt recommend to take OTC Vit. D 4000. Pt voiced understanding.

## 2021-05-31 ENCOUNTER — Ambulatory Visit: Payer: Medicare HMO | Admitting: Internal Medicine

## 2021-05-31 NOTE — Progress Notes (Deleted)
Patient ID: Brooke Davidson, female   DOB: 1945/07/02, 75 y.o.   MRN: [id number]   This visit occurred during the SARS-CoV-2 public health emergency.  Safety protocols were in place, including screening questions prior to the visit, additional usage of staff PPE, and extensive cleaning of exam room while observing appropriate contact time as indicated for disinfecting solutions.   HPI  Brooke Davidson is a 75 y.o.-year-old female, initially referred by her PCP, Brooke Han, MD, returning for follow-up for Graves' disease, elevated parathyroid hormone, and DM2.  Last visit 4 months ago.  Interim history: Before last visit, she started HCTZ 12.5 mg daily. She has no complaints at this visit.  Thyrotoxicosis: She was found to have an abnormal tests in 08/2019 by Dr. Terrence Davidson. She was started on methimazole 5 mg 2x daily in 08/2019 by cardiology.  Also, her metoprolol dose was increased to twice a day before our last visit.  In 10/2019, I advised her to decrease methimazole to 5 mg daily.  She was lost for follow-up afterwards.  When she returns in 01/2021, she was still taking 5 mg twice a day.  We continued this dose.    Reviewed her TFTs: Lab Results  Component Value Date   TSH 1.28 01/28/2021   TSH 1.11 10/22/2019   FREET4 0.74 01/28/2021   FREET4 0.57 (L) 10/22/2019   T3FREE 2.6 01/28/2021   T3FREE 2.4 10/22/2019   Antithyroid antibodies: Lab Results  Component Value Date   TSI 377 (H) 10/22/2019   Pt denies: - feeling nodules in neck - hoarseness - dysphagia - SOB with lying down But she has choking.  Pt denies: - weight loss, in fact, she complains of some weight gain (not by our scale) - heat intolerance - tremors - palpitations - anxiety - hyperdefecation - hair loss  She has anxiety, chronic.  Pt does not have a FH of thyroid ds. No FH of thyroid cancer. No h/o radiation tx to head or neck. No recent contrast studies. No herbal supplements. No  Biotin use.  No Prednisone, Amiodarone.  Elevated alkaline phosphatase:  Reviewed levels: Lab Results  Component Value Date   ALKPHOS 125 (H) 01/28/2021   ALKPHOS 169 (H) 08/26/2019   ALKPHOS 130 (H) 07/26/2019   ALKPHOS 92 12/17/2015   ALKPHOS 86 12/07/2015   ALKPHOS 86 11/20/2015   ALKPHOS 81 12/30/2014   No recent fractures or bone disease.  No known cancer or bone metastasis.  No known liver disease.  Hyperparathyroidism:  Reviewed pertinent labs: Lab Results  Component Value Date   PTH 161 (H) 01/28/2021   PTH Comment 01/28/2021  09/09/2019: Calcium 9.4 (8.6-10.4), iPTH 125 (14-64)  Lab Results  Component Value Date   CALCIUM 9.2 01/28/2021   CALCIUM 9.2 07/26/2019   CALCIUM 9.2 06/08/2019   CALCIUM 9.5 06/07/2019   CALCIUM 9.6 06/06/2019   CALCIUM 9.3 06/04/2019   CALCIUM 8.4 (L) 12/31/2015   CALCIUM 8.9 12/30/2015   CALCIUM 8.7 (L) 12/29/2015   CALCIUM 9.4 12/17/2015   CALCIUM 9.6 12/07/2015   CALCIUM 9.7 11/20/2015   CALCIUM 8.4 (L) 01/07/2015   CALCIUM 8.7 (L) 01/06/2015   CALCIUM 9.0 12/30/2014   CALCIUM 9.4 08/24/2014   At last visit she had significant vitamin D deficiency: Lab Results  Component Value Date   VD25OH 21.85 (L) 01/28/2021   VD25OH 13.7 (L) 10/22/2019  After the above results returned, I advised her to start 4000 units vitamin D daily. She does not remember this- not  taking any vitamin D now.  No history of kidney stones.  DM2:  This is well controlled.  Reviewed HbA1c levels: Lab Results  Component Value Date   HGBA1C 5.8 (A) 01/28/2021   HGBA1C 6.1 (H) 06/06/2019   HGBA1C 5.7 (H) 11/20/2015   HGBA1C 5.9 (H) 01/05/2015  09/09/2019: HbA1c 5.8%  She is on: - Januvia  100 mg before b'fast  She does have a Glucocom glucometer.  + Mild CKD; most recent BUN/creatinine: 09/09/2019: 26/1.42, GFR 43, ACR 6 She is on losartan 100 mg daily.  ? HL. No results found for: CHOL No results found for: HDL No results found for:  LDLCALC No results found for: TRIG No results found for: CHOLHDL No results found for: LDLDIRECT She is on pravastatin 80 mg daily.  Also, on ASA 81, and Eliquis.  ROS: + See HPI   Past Medical History:  Diagnosis Date   Anemia    in the past   Asthma    uses Symbicort daily and ALbuterol as needed   Chronic back pain    Complication of anesthesia    slow to wake up   Constipation    Contact dermatitis due to chemicals    Cough    nonproductive and without fever   Depression    but doesn't take any meds    Diabetes mellitus without complication (HCC)    takes Metformin daily   Eczema    Fall during current hospitalization 12/30/2015 12/30/2015   GERD (gastroesophageal reflux disease)    doesn't take any meds   Glaucoma    uses eye drops daily   Heart murmur    states it was told to her as a younger woman, no problems that she knows    History of bronchitis a yr ago   HLD (hyperlipidemia)    takes Pravastatin daily   Hypertension    takes Losartan-HCTZ daily   Insomnia    takes Ambien nightly    Joint pain    Joint swelling    Muscle spasm    takes Baclofen daily as needed   Osteoarthritis    knees   Primary localized osteoarthritis of left knee    Primary localized osteoarthritis of right knee 12/17/2015   Pulmonary nodule    Rupture of right patellar tendon 12/31/2015   Patient fell the second evening after total knee replacement and suffer and open patella tendon rupture of her right knee.  Surgically repaired after an extensive irrigation and debridement on 12/30/2015   s/p right total knee replacement on 12/28/2015 12/28/2015   Urinary frequency    Urinary urgency    Past Surgical History:  Procedure Laterality Date   [place] Right 12/30/2015   Procedure: IRRIGATION AND DEBRIDEMENT KNEE;  Surgeon: Elsie Saas, MD;  Location: [place];  Service: Orthopedics;  Laterality: Right;   PATELLAR TENDON REPAIR Right  12/30/2015   Procedure: PATELLA TENDON REPAIR;  Surgeon: Elsie Saas, MD;  Location: [place];  Service: Orthopedics;  Laterality: Right;   TOTAL KNEE ARTHROPLASTY Left 01/05/2015   Procedure: TOTAL KNEE ARTHROPLASTY;  Surgeon: Elsie Saas, MD;  Location: [place];  Service: Orthopedics;  Laterality: Left;   TOTAL KNEE ARTHROPLASTY Right 12/28/2015   Procedure: TOTAL KNEE ARTHROPLASTY;  Surgeon: Elsie Saas, MD;  Location: [place];  Service: Orthopedics;  Laterality: Right;   Social History   Socioeconomic History   Marital status: Divorced    Spouse name: Not on  file   Number of children: Not on file   Years of education: Not on file   Highest education level: Not on file  Occupational History   Not on file  Tobacco Use   Smoking status: Former   Smokeless tobacco: Never   Tobacco comments:    quit smoking 51yrs ago  Vaping Use   Vaping Use: Never used  Substance and Sexual Activity   Alcohol use: No   Drug use: No   Sexual activity: Not Currently    Birth control/protection: None  Other Topics Concern   Not on file  Social History Narrative   Not on file   Social Determinants of Health   Financial Resource Strain: Not on file  Food Insecurity: Not on file  Transportation Needs: Not on file  Physical Activity: Not on file  Stress: Not on file  Social Connections: Not on file  Intimate Partner Violence: Not on file   Current Outpatient Medications on File Prior to Visit  Medication Sig Dispense Refill   albuterol (PROVENTIL HFA;VENTOLIN HFA) 108 (90 Base) MCG/ACT inhaler Inhale 2 puffs into the lungs every 6 (six) hours as needed for wheezing or shortness of breath. 8 g 0   apixaban (ELIQUIS) 5 MG TABS tablet Take 5 mg by mouth 2 (two) times daily.     cetirizine (ZYRTEC) 10 MG tablet Take 1 tablet (10 mg total) by mouth daily. 90 tablet 0   estradiol (ESTRACE) 0.1 MG/GM vaginal cream Apply 1 gram per vagina every night for 2 weeks, then apply three times a week (Patient  not taking: Reported on 01/28/2021) 30 g 12   fluticasone (FLONASE) 50 MCG/ACT nasal spray      hydrOXYzine (ATARAX/VISTARIL) 25 MG tablet Take 25 mg by mouth at bedtime as needed.     latanoprost (XALATAN) 0.005 % ophthalmic solution Place 1 drop into both eyes at bedtime.   12   losartan-hydrochlorothiazide (HYZAAR) 100-12.5 MG tablet      LUMIGAN 0.01 % SOLN      methimazole (TAPAZOLE) 5 MG tablet Take 1 tablet (5 mg total) by mouth daily. 45 tablet 3   metoprolol succinate (TOPROL-XL) 25 MG 24 hr tablet Take 2 tablets (50 mg total) by mouth daily. 30 tablet 0   metroNIDAZOLE (FLAGYL) 500 MG tablet Take 1 tablet (500 mg total) by mouth 2 (two) times daily. 14 tablet 0   mometasone-formoterol (DULERA) 100-5 MCG/ACT AERO Inhale into the lungs. (Patient not taking: Reported on 01/28/2021)     olopatadine (PATANOL) 0.1 % ophthalmic solution Place 1 drop into both eyes as needed for allergies.     pravastatin (PRAVACHOL) 80 MG tablet Take 80 mg by mouth at bedtime.      propafenone (RYTHMOL) 150 MG tablet      sitaGLIPtin (JANUVIA) 100 MG tablet Take 100 mg by mouth daily.     SYMBICORT 160-4.5 MCG/ACT inhaler Inhale 2 puffs into the lungs 2 (two) times daily.     zolpidem (AMBIEN) 10 MG tablet Take 10 mg by mouth at bedtime as needed for sleep.     No current facility-administered medications on file prior to visit.   Allergies  Allergen Reactions   Bactrim [Sulfamethoxazole-Trimethoprim] Swelling    Tongue swells   Ivp Dye [Iodinated Diagnostic Agents] Hives   Clonazepam     dizziness   Sulfa Antibiotics Swelling   Codeine Itching   Other Itching and Rash    Perfume   Vicodin [Hydrocodone-Acetaminophen] Itching   Family History  Problem Relation Age of Onset   Heart attack Father    Glaucoma Mother    Diabetes Mellitus II Mother    Hypertension Sister    Cancer Maternal Grandmother    Colon cancer Maternal Grandmother     PE: There were no vitals taken for this visit. Wt  Readings from Last 3 Encounters:  01/28/21 162 lb 12.8 oz (73.8 kg)  12/24/20 163 lb 9.6 oz (74.2 kg)  01/15/20 167 lb 12.8 oz (76.1 kg)   Constitutional: overweight, in NAD Eyes: PERRLA, EOMI, no exophthalmos, no lid lag, no stare ENT: moist mucous membranes, no thyromegaly, no cervical lymphadenopathy Cardiovascular: RRR, No RG, 1/6 SEM Respiratory: CTA B Musculoskeletal: no deformities, strength intact in all 4 Skin: moist, warm, no rashes Neurological: no tremor with outstretched hands, DTR normal in all 4  ASSESSMENT: 1. Graves ds.  2.  Elevated alk phos  3.  Elevated PTH  4. DM2, non-insulin-dependent  PLAN:  1. Patient with a history of thyrotoxicosis, without associated symptoms: No weight loss, heat intolerance, hyper navigation, palpitations.  However, patient is a poor historian and it is difficult to obtain a history of her symptoms. -TSI level was elevated pointing towards her Graves' disease diagnosis -At last visit, her TSI's were elevated pointing towards her Graves' disease diagnosis -She was on methimazole 5 mg twice a day and I advised her to decrease the dose to 5 mg once a day before last visit.  She forgot about this and continued 5 mg twice a day -  returned at last visit after 1 year and 3 months... -At last visit, fortunately, TFTs were normal so I did advise her to continue the same dose of methimazole: 5 mg twice a day.  She was asymptomatic. -We did discuss at last visit about possible modalities for treatment for Graves' disease being VGo20 methimazole, but also RAI treatment or surgery.  We decided to continue methimazole. -At this visit, she feels well, without complaints. No signs of active Graves' ophthalmopathy: Blurry vision, double vision, eye pain, chemosis. -Continues on methimazole 5 mg twice a day. -we will recheck her TFTs and change the methimazole dose accordingly -I will see her back in 4 months  2.  Elevated alk phos -This was possibly  related to her low vitamin D and possibly also thyrotoxicosis, but the alkaline phosphatase level was still slightly high at last visit, at 125 (39-117) -at that time, she still had a low vitamin D -We will recheck this today (fasting)  3.  Elevated PTH -In the past vitamin D was very low, possibly causing her high PTH, especially since her calcium levels were normal (secondary hyperparathyroidism) -We will repeat her calcium and PTH today -We did discuss that if her PTH is high, we may need to refer her to surgery  4.  Vitamin D deficiency -Vitamin D level was very low in the past, 13.7, but at last visit it was slightly better, at 21.85. -At last visit, I again advised her to start vitamin D 4000 units daily -We will recheck the level today  5. DM2, non-insulin-dependent -Well-controlled, with the latest HbA1c from 01/2021 and stable, at 5.8% -She continues on Januvia, which she tolerates well -We will recheck her HbA1c  Component     Latest Ref Rng & Units 01/28/2021  Calcium     8.7 - 10.3 mg/dL 9.2  PTH, Intact     15 - 65 pg/mL 161 (H)  PTH Interp  Comment  Alkaline Phosphatase     39 - 117 U/L 125 (H)  TSH     0.35 - 5.50 uIU/mL 1.28  T4,Free(Direct)     0.60 - 1.60 ng/dL 0.74  Triiodothyronine,Free,Serum     2.3 - 4.2 pg/mL 2.6  VITD     30.00 - 100.00 ng/mL 21.85 (L)   TFTs are excellent now, so for now we will continue the current dose of methimazole, 5 mg twice daily. Vitamin D is low, which can also be the reason why her PTH is high.  I will advise her to restart 4000 units daily and we will recheck her vitamin D at next visit.  Philemon Kingdom, MD PhD [facility]

## 2021-06-22 ENCOUNTER — Ambulatory Visit
Admission: RE | Admit: 2021-06-22 | Discharge: 2021-06-22 | Disposition: A | Discharge status: Home / Self Care | Payer: Medicare HMO | Source: Ambulatory Visit | Attending: Family Medicine | Admitting: Family Medicine

## 2021-06-22 ENCOUNTER — Other Ambulatory Visit: Payer: Self-pay | Admitting: Family Medicine

## 2021-06-22 DIAGNOSIS — R059 Cough, unspecified: Secondary | ICD-10-CM

## 2021-06-22 DIAGNOSIS — J40 Bronchitis, not specified as acute or chronic: Secondary | ICD-10-CM

## 2021-07-01 ENCOUNTER — Other Ambulatory Visit: Payer: Self-pay

## 2021-07-02 ENCOUNTER — Ambulatory Visit (INDEPENDENT_AMBULATORY_CARE_PROVIDER_SITE_OTHER): Payer: Medicare HMO | Admitting: Internal Medicine

## 2021-07-02 ENCOUNTER — Encounter: Payer: Self-pay | Admitting: Internal Medicine

## 2021-07-02 VITALS — BP 140/82 | HR 67 | Ht 63.0 in | Wt 166.8 lb

## 2021-07-02 DIAGNOSIS — E1122 Type 2 diabetes mellitus with diabetic chronic kidney disease: Secondary | ICD-10-CM | POA: Diagnosis not present

## 2021-07-02 DIAGNOSIS — E0500 Thyrotoxicosis with diffuse goiter without thyrotoxic crisis or storm: Secondary | ICD-10-CM

## 2021-07-02 DIAGNOSIS — E559 Vitamin D deficiency, unspecified: Secondary | ICD-10-CM

## 2021-07-02 DIAGNOSIS — R748 Abnormal levels of other serum enzymes: Secondary | ICD-10-CM

## 2021-07-02 DIAGNOSIS — Z794 Long term (current) use of insulin: Secondary | ICD-10-CM

## 2021-07-02 DIAGNOSIS — N1831 Chronic kidney disease, stage 3a: Secondary | ICD-10-CM

## 2021-07-02 DIAGNOSIS — E213 Hyperparathyroidism, unspecified: Secondary | ICD-10-CM | POA: Diagnosis not present

## 2021-07-02 LAB — T4, FREE: Free T4: 0.79 ng/dL (ref 0.60–1.60)

## 2021-07-02 LAB — T3, FREE: T3, Free: 2.9 pg/mL (ref 2.3–4.2)

## 2021-07-02 LAB — VITAMIN D 25 HYDROXY (VIT D DEFICIENCY, FRACTURES): VITD: 32.59 ng/mL (ref 30.00–100.00)

## 2021-07-02 LAB — HEMOGLOBIN A1C: Hgb A1c MFr Bld: 6.3 % (ref 4.6–6.5)

## 2021-07-02 LAB — ALKALINE PHOSPHATASE: Alkaline Phosphatase: 107 U/L (ref 39–117)

## 2021-07-02 LAB — TSH: TSH: 1.46 u[IU]/mL (ref 0.35–5.50)

## 2021-07-02 NOTE — Progress Notes (Addendum)
Patient ID: Brooke Davidson, female   DOB: 1945/12/09, 76 y.o.   MRN: [id number]   This visit occurred during the SARS-CoV-2 public health emergency.  Safety protocols were in place, including screening questions prior to the visit, additional usage of staff PPE, and extensive cleaning of exam room while observing appropriate contact time as indicated for disinfecting solutions.   HPI  Brooke Davidson is a 76 y.o.-year-old female, initially referred by her PCP, Buzzy Han, MD, returning for follow-up for Graves' disease, elevated parathyroid hormone, and DM2.  Last visit 5 months ago. She no showed several appointments and presents 30 minutes later for this appointment.  Interim history: She has a bronchitis >> on Prednisone.  She feels very tired. She had R stiffness and pain. She exercises by walking on the treadmill at the gym.  Thyrotoxicosis: She was found to have an abnormal tests in 08/2019 by Dr. Terrence Dupont. She was started on methimazole 5 mg 2x daily in 08/2019 by cardiology.  Also, her metoprolol dose was increased to twice a day.  In 10/2019, I advised her to decrease methimazole to 5 mg daily.  She was lost for follow-up afterwards.  At last visit, she is still taking it 2x a day... However, since labs were normal, we continued on the same dose.  Reviewed her TFTs: Lab Results  Component Value Date   TSH 1.28 01/28/2021   TSH 1.11 10/22/2019   FREET4 0.74 01/28/2021   FREET4 0.57 (L) 10/22/2019   T3FREE 2.6 01/28/2021   T3FREE 2.4 10/22/2019   Antithyroid antibodies: Lab Results  Component Value Date   TSI 377 (H) 10/22/2019   Pt denies: - feeling nodules in neck - hoarseness - dysphagia - SOB with lying down But she has choking.  She has anxiety, chronic.  Pt does not have a FH of thyroid ds. No FH of thyroid cancer. No h/o radiation tx to head or neck. No recent contrast studies. No herbal supplements. No Biotin use.  No Prednisone,  Amiodarone.  Elevated alkaline phosphatase:  Reviewed levels: Lab Results  Component Value Date   ALKPHOS 125 (H) 01/28/2021   ALKPHOS 169 (H) 08/26/2019   ALKPHOS 130 (H) 07/26/2019   ALKPHOS 92 12/17/2015   ALKPHOS 86 12/07/2015   ALKPHOS 86 11/20/2015   ALKPHOS 81 12/30/2014   No recent fractures or bone disease.  No known cancer or bone metastasis.  No known liver disease.  Hyperparathyroidism:  Reviewed pertinent labs: Lab Results  Component Value Date   PTH 161 (H) 01/28/2021   PTH Comment 01/28/2021    Lab Results  Component Value Date   CALCIUM 9.2 01/28/2021   CALCIUM 9.2 07/26/2019   CALCIUM 9.2 06/08/2019   CALCIUM 9.5 06/07/2019   CALCIUM 9.6 06/06/2019   CALCIUM 9.3 06/04/2019   CALCIUM 8.4 (L) 12/31/2015   CALCIUM 8.9 12/30/2015   CALCIUM 8.7 (L) 12/29/2015   CALCIUM 9.4 12/17/2015   CALCIUM 9.6 12/07/2015   CALCIUM 9.7 11/20/2015   CALCIUM 8.4 (L) 01/07/2015   CALCIUM 8.7 (L) 01/06/2015   CALCIUM 9.0 12/30/2014   CALCIUM 9.4 08/24/2014  09/09/2019: Calcium 9.4 (8.6-10.4), iPTH 125 (14-64)  At last visit she had significant vitamin D deficiency: Lab Results  Component Value Date   VD25OH 21.85 (L) 01/28/2021   VD25OH 13.7 (L) 10/22/2019  At last visit she was not on vitamin D as advised, and I advised her to start 4000 units daily. Now just restarted 1000 units daily.   No history  of kidney stones.  DM2:  This is well controlled.  Reviewed HbA1c levels: Lab Results  Component Value Date   HGBA1C 5.8 (A) 01/28/2021   HGBA1C 6.1 (H) 06/06/2019   HGBA1C 5.7 (H) 11/20/2015   HGBA1C 5.9 (H) 01/05/2015  09/09/2019: HbA1c 5.8%  She is on: - Januvia  100 mg before b'fast  She does have a Glucocom glucometer.  + Mild CKD; most recent BUN/creatinine: 09/09/2019: 26/1.42, GFR 43, ACR 6 She is on losartan 100 mg daily.  ? HL. No results found for: CHOL No results found for: HDL No results found for: LDLCALC No results found for: TRIG No  results found for: CHOLHDL No results found for: LDLDIRECT She is on pravastatin 80 mg daily.  Also, on ASA 81, and Eliquis.  ROS: + See HPI   Past Medical History:  Diagnosis Date   Anemia    in the past   Asthma    uses Symbicort daily and ALbuterol as needed   Chronic back pain    Complication of anesthesia    slow to wake up   Constipation    Contact dermatitis due to chemicals    Cough    nonproductive and without fever   Depression    but doesn't take any meds    Diabetes mellitus without complication (HCC)    takes Metformin daily   Eczema    Fall during current hospitalization 12/30/2015 12/30/2015   GERD (gastroesophageal reflux disease)    doesn't take any meds   Glaucoma    uses eye drops daily   Heart murmur    states it was told to her as a younger woman, no problems that she knows    History of bronchitis a yr ago   HLD (hyperlipidemia)    takes Pravastatin daily   Hypertension    takes Losartan-HCTZ daily   Insomnia    takes Ambien nightly    Joint pain    Joint swelling    Muscle spasm    takes Baclofen daily as needed   Osteoarthritis    knees   Primary localized osteoarthritis of left knee    Primary localized osteoarthritis of right knee 12/17/2015   Pulmonary nodule    Rupture of right patellar tendon 12/31/2015   Patient fell the second evening after total knee replacement and suffer and open patella tendon rupture of her right knee.  Surgically repaired after an extensive irrigation and debridement on 12/30/2015   s/p right total knee replacement on 12/28/2015 12/28/2015   Urinary frequency    Urinary urgency    Past Surgical History:  Procedure Laterality Date   Wolf Trap Right 12/30/2015   Procedure: IRRIGATION AND DEBRIDEMENT KNEE;  Surgeon: Elsie Saas, MD;  Location: [place];  Service: Orthopedics;  Laterality: Right;   PATELLAR TENDON REPAIR Right 12/30/2015   Procedure: PATELLA TENDON  REPAIR;  Surgeon: Elsie Saas, MD;  Location: [place];  Service: Orthopedics;  Laterality: Right;   TOTAL KNEE ARTHROPLASTY Left 01/05/2015   Procedure: TOTAL KNEE ARTHROPLASTY;  Surgeon: Elsie Saas, MD;  Location: [place];  Service: Orthopedics;  Laterality: Left;   TOTAL KNEE ARTHROPLASTY Right 12/28/2015   Procedure: TOTAL KNEE ARTHROPLASTY;  Surgeon: Elsie Saas, MD;  Location: [place];  Service: Orthopedics;  Laterality: Right;   Social History   Socioeconomic History   Marital status: Divorced    Spouse name: Not on file   Number of children: Not on  file   Years of education: Not on file   Highest education level: Not on file  Occupational History   Not on file  Tobacco Use   Smoking status: Former   Smokeless tobacco: Never   Tobacco comments:    quit smoking 71yr ago  Vaping Use   Vaping Use: Never used  Substance and Sexual Activity   Alcohol use: No   Drug use: No   Sexual activity: Not Currently    Birth control/protection: None  Other Topics Concern   Not on file  Social History Narrative   Not on file   Social Determinants of Health   Financial Resource Strain: Not on file  Food Insecurity: Not on file  Transportation Needs: Not on file  Physical Activity: Not on file  Stress: Not on file  Social Connections: Not on file  Intimate Partner Violence: Not on file   Current Outpatient Medications on File Prior to Visit  Medication Sig Dispense Refill   albuterol (PROVENTIL HFA;VENTOLIN HFA) 108 (90 Base) MCG/ACT inhaler Inhale 2 puffs into the lungs every 6 (six) hours as needed for wheezing or shortness of breath. 8 g 0   apixaban (ELIQUIS) 5 MG TABS tablet Take 5 mg by mouth 2 (two) times daily.     cetirizine (ZYRTEC) 10 MG tablet Take 1 tablet (10 mg total) by mouth daily. 90 tablet 0   estradiol (ESTRACE) 0.1 MG/GM vaginal cream Apply 1 gram per vagina every night for 2 weeks, then apply three times a week (Patient not taking: Reported on 01/28/2021) 30 g  12   fluticasone (FLONASE) 50 MCG/ACT nasal spray      hydrOXYzine (ATARAX/VISTARIL) 25 MG tablet Take 25 mg by mouth at bedtime as needed.     latanoprost (XALATAN) 0.005 % ophthalmic solution Place 1 drop into both eyes at bedtime.   12   losartan-hydrochlorothiazide (HYZAAR) 100-12.5 MG tablet      LUMIGAN 0.01 % SOLN      methimazole (TAPAZOLE) 5 MG tablet Take 1 tablet (5 mg total) by mouth daily. 45 tablet 3   metoprolol succinate (TOPROL-XL) 25 MG 24 hr tablet Take 2 tablets (50 mg total) by mouth daily. 30 tablet 0   metroNIDAZOLE (FLAGYL) 500 MG tablet Take 1 tablet (500 mg total) by mouth 2 (two) times daily. 14 tablet 0   mometasone-formoterol (DULERA) 100-5 MCG/ACT AERO Inhale into the lungs. (Patient not taking: Reported on 01/28/2021)     olopatadine (PATANOL) 0.1 % ophthalmic solution Place 1 drop into both eyes as needed for allergies.     pravastatin (PRAVACHOL) 80 MG tablet Take 80 mg by mouth at bedtime.      propafenone (RYTHMOL) 150 MG tablet      sitaGLIPtin (JANUVIA) 100 MG tablet Take 100 mg by mouth daily.     SYMBICORT 160-4.5 MCG/ACT inhaler Inhale 2 puffs into the lungs 2 (two) times daily.     zolpidem (AMBIEN) 10 MG tablet Take 10 mg by mouth at bedtime as needed for sleep.     No current facility-administered medications on file prior to visit.   Allergies  Allergen Reactions   Bactrim [Sulfamethoxazole-Trimethoprim] Swelling    Tongue swells   Ivp Dye [Iodinated Contrast Media] Hives   Clonazepam     dizziness   Sulfa Antibiotics Swelling   Codeine Itching   Other Itching and Rash    Perfume   Vicodin [Hydrocodone-Acetaminophen] Itching   Family History  Problem Relation Age of Onset  Heart attack Father    Glaucoma Mother    Diabetes Mellitus II Mother    Hypertension Sister    Cancer Maternal Grandmother    Colon cancer Maternal Grandmother    PE: BP 140/82 (BP Location: Right Arm, Patient Position: Sitting, Cuff Size: Normal)    Pulse 67     Ht _0  (1.6 m)    Wt 166 lb 12.8 oz (75.7 kg)    SpO2 96%    BMI 29.55 kg/m  Wt Readings from Last 3 Encounters:  07/02/21 166 lb 12.8 oz (75.7 kg)  01/28/21 162 lb 12.8 oz (73.8 kg)  12/24/20 163 lb 9.6 oz (74.2 kg)   Constitutional: overweight, in NAD Eyes: PERRLA, EOMI, no exophthalmos, no lid lag, no stare ENT: moist mucous membranes, no thyromegaly, no thyroid bruits, no cervical lymphadenopathy Cardiovascular: RRR, No RG, 1/6 SEM Respiratory: CTA B Musculoskeletal: no deformities, strength intact in all 4 Skin: moist, warm, no rashes Neurological: no tremor with outstretched hands, DTR normal in all 4  ASSESSMENT: 1. Thyrotoxicosis  2.  Elevated alk phos  3.  Elevated PTH  4.  Vitamin D deficiency  5. DM2, non-insulin-dependent, controlled  PLAN:  1.  Patient with a history of thyrotoxicosis, without associated symptoms: No weight loss, heat intolerance, hyper defecation, palpitations.  However, she is a poor historian and it is difficult to obtain a history of her symptoms. -Her TSI's were elevated, pointing towards a Graves' disease diagnosis -She continues on methimazole 5 mg twice a day -We discussed in the past about different modalities of Graves' disease including methimazole use, but also RAI treatment or surgery.  We decided to continue methimazole, as she was responding well to it -At this visit, she is feeling very fatigued. -She is on prednisone for bronchitis so unfortunately, we cannot check TFTs today -we will check her TFTs in ~ 1.5 months and change the methimazole dose accordingly   2.  Elevated alk phos -This was most likely related to her low vitamin D and also possibly thyrotoxicosis -Latest value was 125, lower than before, but she still had a low vitamin D at that time -We will recheck the alkaline phosphatase level at next lab draw   3.  Elevated PTH -In the past, vitamin D was very low, possibly causing her high PTH, especially since her  calcium levels were normal (secondary hyperparathyroidism) -We will repeat her calcium and vit D level in 1.5 months -We did discuss at last visit that if her PTH remains high, she may need to have surgery for a possible parathyroid adenoma   4.  Vitamin D deficiency -Vitamin D level was very low in the past, 13.7, but at last visit was slightly better, at 21.85 -At last visit I again advised her to start vitamin D 4000 units daily.  She called back to clarify this after the visit. -At this visit, however, she does not that she was still off vitamin D and only restarted it few days ago, at 1000 units daily -For now, I advised her to continue the same dose -We will recheck the level when she returns for labs   5. DM2, non-insulin-dependent -Well-controlled, with the latest HbA1c from 01/2021 stable, at goal, at 5.8% -She continues on Januvia, which she tolerates well -We will recheck HbA1c when she returns for labs  At this visit, we discussed that since she no showed too many appointments and did not keep up with the appointment times, unfortunately, I will not  be able to see her.  I will check her labs in a month and a half and change her medication doses if needed, though.  Orders Placed This Encounter  Procedures   TSH   T4, free   T3, free   VITAMIN D 25 Hydroxy (Vit-D Deficiency, Fractures)   PTH, intact and calcium   Alkaline phosphatase   Hemoglobin A1c   Component     Latest Ref Rng & Units 07/02/2021  Calcium     8.7 - 10.3 mg/dL 9.7  PTH, Intact     15 - 65 pg/mL 57  PTH Interp      Comment  Alkaline Phosphatase     39 - 117 U/L 107  Hemoglobin A1C     4.6 - 6.5 % 6.3  TSH     0.35 - 5.50 uIU/mL 1.46  T4,Free(Direct)     0.60 - 1.60 ng/dL 0.79  Triiodothyronine,Free,Serum     2.3 - 4.2 pg/mL 2.9  VITD     30.00 - 100.00 ng/mL 32.59   She returned for labs sooner than the intended 1.5 months: Vitamin D level is now normal, with normalization of her PTH and  alkaline phosphatase.  TFTs are also normal. HbA1c is higher. Continue to follow-up with PCP, but no further investigation is needed for now.  Philemon Kingdom, MD PhD [facility]

## 2021-07-02 NOTE — Patient Instructions (Signed)
Please continue: - Methimazole 5 mg 2x a day - vitamin D 1000 units - Januvia 100 mcg daily before b'fast  Please come back in 1.5 month for labs.  Please establish care with another endocrinologist.

## 2021-07-03 LAB — PTH, INTACT AND CALCIUM
Calcium: 9.7 mg/dL (ref 8.7–10.3)
PTH: 57 pg/mL (ref 15–65)

## 2021-07-06 ENCOUNTER — Telehealth: Payer: Self-pay | Admitting: Internal Medicine

## 2021-07-06 ENCOUNTER — Telehealth: Payer: Self-pay

## 2021-07-06 NOTE — Telephone Encounter (Addendum)
Left detailed message for pt with results. ----- Message from Philemon Kingdom, MD sent at 07/06/2021  9:09 AM EST ----- Can you please call pt.:  Vitamin D level is now normal, as are her parathyroid hormone and alkaline phosphatase.  All thyroid tests are also normal. HbA1c is higher. Continue to follow-up with PCP, but no further investigation is needed for now.

## 2021-07-06 NOTE — Telephone Encounter (Signed)
Patient dismissed from [facility] by Carlus Pavlov, MD, effective 07/02/21. Dismissal Letter sent out by 1st class mail. [employer]

## 2021-07-28 NOTE — Progress Notes (Signed)
Duplicate

## 2021-08-17 ENCOUNTER — Other Ambulatory Visit: Payer: Medicare HMO

## 2021-08-27 ENCOUNTER — Telehealth: Payer: Self-pay

## 2021-08-27 NOTE — Telephone Encounter (Signed)
Carly  ? ?This is not a provider task. Ask Yvonna Alanis to help with this request or DJ. I added DJ and Kaitlyn to this message ? ?I would request the administration let payors know our new address

## 2021-08-27 NOTE — Telephone Encounter (Signed)
Noted  

## 2021-08-27 NOTE — Telephone Encounter (Signed)
Copied from CRM [id number]. Topic: General - Other ?>> Aug 23, 2021  3:51 PM Wyonia Hough E wrote: ?Reason for CRM: Pt asked if someone would reach out to [facility] with the office's new demographic information / they told her the [address] address [id number] but she told them that is not correct / please advise ?

## 2021-10-05 DIAGNOSIS — E118 Type 2 diabetes mellitus with unspecified complications: Secondary | ICD-10-CM | POA: Diagnosis not present

## 2021-10-05 DIAGNOSIS — I4891 Unspecified atrial fibrillation: Secondary | ICD-10-CM | POA: Diagnosis not present

## 2021-10-05 DIAGNOSIS — E1139 Type 2 diabetes mellitus with other diabetic ophthalmic complication: Secondary | ICD-10-CM | POA: Diagnosis not present

## 2021-10-05 DIAGNOSIS — R748 Abnormal levels of other serum enzymes: Secondary | ICD-10-CM | POA: Diagnosis not present

## 2021-10-05 DIAGNOSIS — E1122 Type 2 diabetes mellitus with diabetic chronic kidney disease: Secondary | ICD-10-CM | POA: Diagnosis not present

## 2021-10-05 DIAGNOSIS — F331 Major depressive disorder, recurrent, moderate: Secondary | ICD-10-CM | POA: Diagnosis not present

## 2021-10-05 DIAGNOSIS — R0600 Dyspnea, unspecified: Secondary | ICD-10-CM | POA: Diagnosis not present

## 2021-10-05 DIAGNOSIS — D6869 Other thrombophilia: Secondary | ICD-10-CM | POA: Diagnosis not present

## 2021-10-05 DIAGNOSIS — K219 Gastro-esophageal reflux disease without esophagitis: Secondary | ICD-10-CM | POA: Diagnosis not present

## 2021-10-05 DIAGNOSIS — D649 Anemia, unspecified: Secondary | ICD-10-CM | POA: Diagnosis not present

## 2021-10-05 DIAGNOSIS — E1140 Type 2 diabetes mellitus with diabetic neuropathy, unspecified: Secondary | ICD-10-CM | POA: Diagnosis not present

## 2021-10-05 DIAGNOSIS — N183 Chronic kidney disease, stage 3 unspecified: Secondary | ICD-10-CM | POA: Diagnosis not present

## 2021-10-05 DIAGNOSIS — E039 Hypothyroidism, unspecified: Secondary | ICD-10-CM | POA: Diagnosis not present

## 2021-10-05 DIAGNOSIS — N2581 Secondary hyperparathyroidism of renal origin: Secondary | ICD-10-CM | POA: Diagnosis not present

## 2021-10-05 DIAGNOSIS — R5382 Chronic fatigue, unspecified: Secondary | ICD-10-CM | POA: Diagnosis not present

## 2021-10-05 DIAGNOSIS — Z0000 Encounter for general adult medical examination without abnormal findings: Secondary | ICD-10-CM | POA: Diagnosis not present

## 2021-11-24 ENCOUNTER — Ambulatory Visit: Payer: Medicare HMO | Admitting: Critical Care Medicine

## 2021-11-29 ENCOUNTER — Ambulatory Visit: Payer: Medicare HMO | Admitting: Critical Care Medicine

## 2022-02-01 ENCOUNTER — Encounter (HOSPITAL_BASED_OUTPATIENT_CLINIC_OR_DEPARTMENT_OTHER): Payer: Self-pay

## 2022-02-01 DIAGNOSIS — G2581 Restless legs syndrome: Secondary | ICD-10-CM

## 2022-02-01 DIAGNOSIS — G4700 Insomnia, unspecified: Secondary | ICD-10-CM

## 2022-02-21 ENCOUNTER — Ambulatory Visit: Payer: Medicare HMO | Admitting: Podiatry

## 2022-03-18 ENCOUNTER — Other Ambulatory Visit: Payer: Self-pay | Admitting: Geriatric Medicine

## 2022-03-21 ENCOUNTER — Other Ambulatory Visit: Payer: Self-pay | Admitting: Geriatric Medicine

## 2022-03-21 ENCOUNTER — Ambulatory Visit: Payer: Self-pay

## 2022-03-21 ENCOUNTER — Ambulatory Visit: Payer: Medicare HMO | Admitting: Critical Care Medicine

## 2022-03-21 ENCOUNTER — Ambulatory Visit: Payer: Medicare HMO | Admitting: Podiatry

## 2022-03-21 DIAGNOSIS — M5416 Radiculopathy, lumbar region: Secondary | ICD-10-CM

## 2022-03-21 NOTE — Telephone Encounter (Signed)
  Chief Complaint: reaction Symptoms: rash on upper back, chest, and neck, itching r/t taking tizanidine Frequency: yesterday  Pertinent Negatives: Patient denies SOB  Disposition: [] ED /[] Urgent Care (no appt availability in office) / [] Appointment(In office/virtual)/ []  [place] [facility]/ [x] Home Care/ [] Refused Recommended Disposition /[] [place] [facility]/ []  Follow-up with PCP Additional Notes: pt calling to let us know why she wasn't coming to appt tomorrow. Agent transferred d/t pt having reaction to tizanidine. Pt states she is drinking plently of water and using cortisone cream. Advised pt to take Benadryl as well and calling UC where she went to see if they can give her something different for pain. Pt verbalized understanding and states she will call back to reschedule appt.   Reason for Disposition  Medicine patch causing local rash or itching  Answer Assessment - Initial Assessment Questions 1. NAME of MEDICINE: "What medicine(s) are you calling about?"     Tizanidine 2. QUESTION: "What is your question?" (e.g., double dose of medicine, side effect)     Having reaction to medication 3. PRESCRIBER: "Who prescribed the medicine?" Reason: if prescribed by specialist, call should be referred to that group.     [facility] provider 4. SYMPTOMS: "Do you have any symptoms?" If Yes, ask: "What symptoms are you having?"  "How bad are the symptoms (e.g., mild, moderate, severe)     Itching, rash on upper back, chest, and neck, still having lower back pain  Protocols used: Medication Question Call-A-AH

## 2022-03-22 ENCOUNTER — Ambulatory Visit: Payer: Medicare HMO | Admitting: Critical Care Medicine

## 2022-04-01 ENCOUNTER — Ambulatory Visit (HOSPITAL_COMMUNITY)
Admission: EM | Admit: 2022-04-01 | Discharge: 2022-04-01 | Disposition: A | Discharge status: Home / Self Care | Payer: Medicare HMO | Attending: Emergency Medicine | Admitting: Emergency Medicine

## 2022-04-01 ENCOUNTER — Telehealth (HOSPITAL_COMMUNITY): Payer: Self-pay

## 2022-04-01 ENCOUNTER — Encounter (HOSPITAL_COMMUNITY): Payer: Self-pay

## 2022-04-01 DIAGNOSIS — L089 Local infection of the skin and subcutaneous tissue, unspecified: Secondary | ICD-10-CM | POA: Diagnosis not present

## 2022-04-01 DIAGNOSIS — R21 Rash and other nonspecific skin eruption: Secondary | ICD-10-CM | POA: Diagnosis not present

## 2022-04-01 MED ORDER — MUPIROCIN 2 % EX OINT: TOPICAL_OINTMENT | Freq: Two times a day (BID) | CUTANEOUS | 0 refills | Status: DC

## 2022-04-01 MED ORDER — MUPIROCIN 2 % EX OINT: TOPICAL_OINTMENT | Freq: Two times a day (BID) | CUTANEOUS | 0 refills | Status: AC

## 2022-04-01 MED ORDER — DOXYCYCLINE HYCLATE 100 MG PO CAPS: 100.0000 mg | ORAL_CAPSULE | Freq: Two times a day (BID) | ORAL | 0 refills | Status: AC

## 2022-04-01 NOTE — Discharge Instructions (Addendum)
Please apply the cream twice daily for at least 7 days  Take the oral antibiotic (doxycycline) twice daily, with food, for a total of 5 days  I recommend following up with your primary care provider. You may need to see dermatology if symptoms persist.

## 2022-04-01 NOTE — ED Triage Notes (Signed)
Pt is here for possible allergic reaction to azithromycin, tizanidine , and calahist  causing pt to have a rash causing lots of itching

## 2022-04-01 NOTE — ED Provider Notes (Signed)
[place]    CSN: [id number] Arrival date & time: 04/01/22  0946      History   Chief Complaint Chief Complaint  Patient presents with   Allergic Reaction    HPI Brooke Davidson is a 76 y.o. female.  Presents with rash to the neck x 3 weeks First it was very itchy and she has been scratching a lot, reports it is burning now She feels its related to the azithromycin, muscle relaxer, calamine lotion she was using  No fevers, chills, nausea, vomiting. Denies trouble breathing. No swelling of lips or tongue  Past Medical History:  Diagnosis Date   Anemia    in the past   Asthma    uses Symbicort daily and ALbuterol as needed   Chronic back pain    Complication of anesthesia    slow to wake up   Constipation    Contact dermatitis due to chemicals    Cough    nonproductive and without fever   Depression    but doesn't take any meds    Diabetes mellitus without complication (HCC)    takes Metformin daily   Eczema    Fall during current hospitalization 12/30/2015 12/30/2015   GERD (gastroesophageal reflux disease)    doesn't take any meds   Glaucoma    uses eye drops daily   Heart murmur    states it was told to her as a younger woman, no problems that she knows    History of bronchitis a yr ago   HLD (hyperlipidemia)    takes Pravastatin daily   Hypertension    takes Losartan-HCTZ daily   Insomnia    takes Ambien nightly    Joint pain    Joint swelling    Muscle spasm    takes Baclofen daily as needed   Osteoarthritis    knees   Primary localized osteoarthritis of left knee    Primary localized osteoarthritis of right knee 12/17/2015   Pulmonary nodule    Rupture of right patellar tendon 12/31/2015   Patient fell the second evening after total knee replacement and suffer and open patella tendon rupture of her right knee.  Surgically repaired after an extensive irrigation and debridement on 12/30/2015   s/p right total knee replacement on  12/28/2015 12/28/2015   Urinary frequency    Urinary urgency     Patient Active Problem List   Diagnosis Date Noted   Chest pain    A-fib ([place])    Rupture of right patellar tendon 12/31/2015   Fall during current hospitalization 12/30/2015 12/30/2015   s/p right total knee replacement on 12/28/2015 12/28/2015   Diverticulitis of colon 12/18/2015   Primary localized osteoarthritis of right knee 12/17/2015   Contact dermatitis due to chemicals    DJD (degenerative joint disease) of knee 01/05/2015   Primary localized osteoarthritis of left knee    Osteoarthritis    Schizoaffective disorder ([place])    Hypertension    HLD (hyperlipidemia)    Asthma    Pulmonary nodule    Glaucoma    Insomnia    Well woman exam with routine gynecological exam 08/04/2014    Past Surgical History:  Procedure Laterality Date   [place] KNEE Right 12/30/2015   Procedure: IRRIGATION AND DEBRIDEMENT KNEE;  Surgeon: Elsie Saas, MD;  Location: [place];  Service: Orthopedics;  Laterality: Right;   PATELLAR TENDON REPAIR Right 12/30/2015   Procedure: PATELLA TENDON REPAIR;  Surgeon: Elsie Saas, MD;  Location: [place];  Service: Orthopedics;  Laterality: Right;   TOTAL KNEE ARTHROPLASTY Left 01/05/2015   Procedure: TOTAL KNEE ARTHROPLASTY;  Surgeon: Elsie Saas, MD;  Location: [place];  Service: Orthopedics;  Laterality: Left;   TOTAL KNEE ARTHROPLASTY Right 12/28/2015   Procedure: TOTAL KNEE ARTHROPLASTY;  Surgeon: Elsie Saas, MD;  Location: [place];  Service: Orthopedics;  Laterality: Right;    OB History     Gravida  3   Para  2   Term  2   Preterm      AB  1   Living  2      SAB      IAB  1   Ectopic      Multiple      Live Births               Home Medications    Prior to Admission medications   Medication Sig Start Date End Date Taking? Authorizing Provider  doxycycline (VIBRAMYCIN) 100 MG capsule Take 1 capsule (100 mg total)  by mouth 2 (two) times daily for 5 days. 04/01/22 04/06/22 Yes Denica Web, Wells Guiles, PA-C  mupirocin ointment (BACTROBAN) 2 % Apply topically 2 (two) times daily for 8 days. 04/01/22 04/09/22 Yes Crista Nuon, Wells Guiles, PA-C  albuterol (PROVENTIL HFA;VENTOLIN HFA) 108 (90 Base) MCG/ACT inhaler Inhale 2 puffs into the lungs every 6 (six) hours as needed for wheezing or shortness of breath. 05/05/18   Scot Jun, FNP  amLODipine (NORVASC) 5 MG tablet Take by mouth. 08/23/21   [provider]  apixaban (ELIQUIS) 5 MG TABS tablet Take 5 mg by mouth 2 (two) times daily.    [provider]  atorvastatin (LIPITOR) 40 MG tablet Take 40 mg by mouth daily. 06/21/21   [provider]  benzonatate (TESSALON) 100 MG capsule Take 100 mg by mouth 3 (three) times daily. 08/24/21   [provider]  cetirizine (ZYRTEC) 10 MG tablet Take 1 tablet (10 mg total) by mouth daily. 05/05/18   Scot Jun, FNP  estradiol (ESTRACE) 0.1 MG/GM vaginal cream Apply 1 gram per vagina every night for 2 weeks, then apply three times a week Patient not taking: Reported on 01/28/2021 12/24/20   Constant, Peggy, MD  famotidine (PEPCID) 20 MG tablet  10/06/21   [provider]  fluticasone Asencion Islam) 50 MCG/ACT nasal spray  09/21/19   [provider]  hydrOXYzine (ATARAX/VISTARIL) 25 MG tablet Take 25 mg by mouth at bedtime as needed. 10/20/20   [provider]  ipratropium-albuterol (DUONEB) 0.5-2.5 (3) MG/3ML SOLN Take 3 mLs by nebulization every 6 (six) hours as needed. 07/05/21   [provider]  JANUVIA 50 MG tablet  10/06/21   [provider]  latanoprost (XALATAN) 0.005 % ophthalmic solution Place 1 drop into both eyes at bedtime.  07/31/14   [provider]  losartan-hydrochlorothiazide Konrad Penta) 100-12.5 MG tablet  09/30/20   [provider]  LUMIGAN 0.01 % SOLN  09/21/19   [provider]  methimazole (TAPAZOLE) 5 MG tablet Take 1  tablet (5 mg total) by mouth daily. 10/25/19   Philemon Kingdom, MD  metoprolol succinate (TOPROL-XL) 25 MG 24 hr tablet Take 2 tablets (50 mg total) by mouth daily. 06/08/19   Debbe Odea, MD  metroNIDAZOLE (FLAGYL) 500 MG tablet Take 1 tablet (500 mg total) by mouth 2 (two) times daily. 12/25/20   Constant, Peggy, MD  mometasone-formoterol (DULERA) 100-5 MCG/ACT AERO Inhale into the lungs.  Patient not taking: Reported on 01/28/2021    [provider]  olopatadine (PATANOL) 0.1 % ophthalmic solution Place 1 drop into both eyes as needed for allergies.    [provider]  pravastatin (PRAVACHOL) 80 MG tablet Take 80 mg by mouth at bedtime.     [provider]  propafenone (RYTHMOL) 150 MG tablet  07/10/19   [provider]  rosuvastatin (CRESTOR) 20 MG tablet Take 20 mg by mouth daily. 06/09/21   [provider]  sitaGLIPtin (JANUVIA) 100 MG tablet Take 100 mg by mouth daily.    [provider]  SYMBICORT 160-4.5 MCG/ACT inhaler Inhale 2 puffs into the lungs 2 (two) times daily. 07/14/20   [provider]  triamcinolone ointment (KENALOG) 0.1 %  10/04/21   [provider]  zolpidem (AMBIEN) 10 MG tablet Take 10 mg by mouth at bedtime as needed for sleep.    [provider]    Family History Family History  Problem Relation Age of Onset   Heart attack Father    Glaucoma Mother    Diabetes Mellitus II Mother    Hypertension Sister    Cancer Maternal Grandmother    Colon cancer Maternal Grandmother     Social History Social History   Tobacco Use   Smoking status: Former   Smokeless tobacco: Never   Tobacco comments:    quit smoking 39yrs ago  Vaping Use   Vaping Use: Never used  Substance Use Topics   Alcohol use: No   Drug use: No     Allergies   Bactrim [sulfamethoxazole-trimethoprim], Ivp dye [iodinated contrast media], Calahist [pramoxine-calamine], Clonazepam, Sulfa antibiotics, Azithromycin,  Codeine, Other, Tizanidine, and Vicodin [hydrocodone-acetaminophen]   Review of Systems Review of Systems  Per HPI Physical Exam Triage Vital Signs ED Triage Vitals  Enc Vitals Group     BP 04/01/22 1107 128/65     Pulse Rate 04/01/22 1107 82     Resp --      Temp 04/01/22 1107 97.7 F (36.5 C)     Temp Source 04/01/22 1107 Oral     SpO2 04/01/22 1107 96 %     Weight --      Height --      Head Circumference --      Peak Flow --      Pain Score 04/01/22 1105 0     Pain Loc --      Pain Edu? --      Excl. in [place]? --    No data found.  Updated Vital Signs BP 128/65 (BP Location: Right Arm)   Pulse 82   Temp 97.7 F (36.5 C) (Oral)   SpO2 96%    Physical Exam Vitals and nursing note reviewed.  Constitutional:      General: She is not in acute distress.    Appearance: Normal appearance.  HENT:     Mouth/Throat:     Mouth: Mucous membranes are moist. No angioedema.     Pharynx: Oropharynx is clear. Uvula midline. No pharyngeal swelling, posterior oropharyngeal erythema or uvula swelling.     Comments: No facial swelling Eyes:     Conjunctiva/sclera: Conjunctivae normal.     Pupils: Pupils are equal, round, and reactive to light.  Cardiovascular:     Rate and Rhythm: Normal rate and regular rhythm.     Pulses: Normal pulses.     Heart sounds: Normal heart sounds.  Pulmonary:     Effort: Pulmonary effort is normal.  No respiratory distress.     Breath sounds: Normal breath sounds. No wheezing.     Comments: Lungs clear throughout Musculoskeletal:        General: Normal range of motion.     Cervical back: Normal range of motion.  Skin:    Findings: Rash present.     Comments: Anterior neck has a red, blistering, warm rash.  No vesicles or drainage noted  Neurological:     Mental Status: She is alert and oriented to person, place, and time.     UC Treatments / Results  Labs (all labs ordered are listed, but only abnormal results are displayed) Labs  Reviewed - No data to display  EKG  Radiology No results found.  Procedures Procedures  Medications Ordered in UC Medications - No data to display  Initial Impression / Assessment and Plan / UC Course  I have reviewed the triage vital signs and the nursing notes.  Pertinent labs & imaging results that were available during my care of the patient were reviewed by me and considered in my medical decision making (see chart for details).  Unknown etiology of original rash, however patient most likely has itched so much that superficial bacterial infection has developed. We will treat with Bactroban applied twice daily for a week, in combo with Doxy 100 mg twice daily for 5 days.  Recommend follow-up with her primary care provider regarding rash.  She may need to see dermatology if rash persists or worsens.  Patient agrees to plan  Final Clinical Impressions(s) / UC Diagnoses   Final diagnoses:  Superficial skin infection  Rash and nonspecific skin eruption     Discharge Instructions      Please apply the cream twice daily for at least 7 days  Take the oral antibiotic (doxycycline) twice daily, with food, for a total of 5 days  I recommend following up with your primary care provider. You may need to see dermatology if symptoms persist.     ED Prescriptions     Medication Sig Dispense Auth. Provider   mupirocin ointment (BACTROBAN) 2 % Apply topically 2 (two) times daily for 8 days. 22 g Bena Kobel, PA-C   doxycycline (VIBRAMYCIN) 100 MG capsule Take 1 capsule (100 mg total) by mouth 2 (two) times daily for 5 days. 10 capsule Letisha Yera, Wells Guiles, PA-C      PDMP not reviewed this encounter.   Mahmud Keithly, Wells Guiles, [place] 04/01/22 [id number]

## 2022-04-19 ENCOUNTER — Encounter: Payer: Self-pay | Admitting: Geriatric Medicine

## 2022-04-20 ENCOUNTER — Ambulatory Visit
Admission: RE | Admit: 2022-04-20 | Discharge: 2022-04-20 | Disposition: A | Discharge status: Home / Self Care | Payer: Medicare HMO | Source: Ambulatory Visit | Attending: Geriatric Medicine | Admitting: Geriatric Medicine

## 2022-04-20 DIAGNOSIS — M5416 Radiculopathy, lumbar region: Secondary | ICD-10-CM

## 2022-07-22 ENCOUNTER — Encounter (HOSPITAL_COMMUNITY): Payer: Self-pay

## 2022-07-22 ENCOUNTER — Ambulatory Visit (HOSPITAL_COMMUNITY)
Admission: EM | Admit: 2022-07-22 | Discharge: 2022-07-22 | Disposition: A | Discharge status: Home / Self Care | Payer: Medicare HMO | Attending: Family Medicine | Admitting: Family Medicine

## 2022-07-22 DIAGNOSIS — H1031 Unspecified acute conjunctivitis, right eye: Secondary | ICD-10-CM | POA: Diagnosis not present

## 2022-07-22 MED ORDER — GENTAMICIN SULFATE 0.3 % OP SOLN: 2.0000 [drp] | Freq: Three times a day (TID) | OPHTHALMIC | 0 refills | Status: AC

## 2022-07-22 NOTE — Discharge Instructions (Signed)
Put gentamicin eyedrops in the affected eye(s) 3 times daily for 5 days.

## 2022-07-22 NOTE — ED Provider Notes (Signed)
[place]    CSN: [id number] Arrival date & time: 07/22/22  1245      History   Chief Complaint Chief Complaint  Patient presents with   Eye Pain    HPI Brooke Davidson is a 77 y.o. female.    Eye Pain   Here for irritation and discharge from her right eye.  It also itches.  This began about 2 or 3 days ago.  No fever or cough.  She she was exposed to the this when she got a new cell phone and saw that the salesman and the cell phone store were not washing her hands between touching different screens  Past Medical History:  Diagnosis Date   Anemia    in the past   Asthma    uses Symbicort daily and ALbuterol as needed   Chronic back pain    Complication of anesthesia    slow to wake up   Constipation    Contact dermatitis due to chemicals    Cough    nonproductive and without fever   Depression    but doesn't take any meds    Diabetes mellitus without complication (HCC)    takes Metformin daily   Eczema    Fall during current hospitalization 12/30/2015 12/30/2015   GERD (gastroesophageal reflux disease)    doesn't take any meds   Glaucoma    uses eye drops daily   Heart murmur    states it was told to her as a younger woman, no problems that she knows    History of bronchitis a yr ago   HLD (hyperlipidemia)    takes Pravastatin daily   Hypertension    takes Losartan-HCTZ daily   Insomnia    takes Ambien nightly    Joint pain    Joint swelling    Muscle spasm    takes Baclofen daily as needed   Osteoarthritis    knees   Primary localized osteoarthritis of left knee    Primary localized osteoarthritis of right knee 12/17/2015   Pulmonary nodule    Rupture of right patellar tendon 12/31/2015   Patient fell the second evening after total knee replacement and suffer and open patella tendon rupture of her right knee.  Surgically repaired after an extensive irrigation and debridement on 12/30/2015   s/p right total knee replacement on 12/28/2015  12/28/2015   Urinary frequency    Urinary urgency     Patient Active Problem List   Diagnosis Date Noted   Chest pain    A-fib ([place])    Rupture of right patellar tendon 12/31/2015   Fall during current hospitalization 12/30/2015 12/30/2015   s/p right total knee replacement on 12/28/2015 12/28/2015   Diverticulitis of colon 12/18/2015   Primary localized osteoarthritis of right knee 12/17/2015   Contact dermatitis due to chemicals    DJD (degenerative joint disease) of knee 01/05/2015   Primary localized osteoarthritis of left knee    Osteoarthritis    Schizoaffective disorder ([place])    Hypertension    HLD (hyperlipidemia)    Asthma    Pulmonary nodule    Glaucoma    Insomnia    Well woman exam with routine gynecological exam 08/04/2014    Past Surgical History:  Procedure Laterality Date   [place] KNEE Right 12/30/2015   Procedure: IRRIGATION AND DEBRIDEMENT KNEE;  Surgeon: Elsie Saas, MD;  Location: [place];  Service: Orthopedics;  Laterality: Right;  PATELLAR TENDON REPAIR Right 12/30/2015   Procedure: PATELLA TENDON REPAIR;  Surgeon: Elsie Saas, MD;  Location: [facility];  Service: Orthopedics;  Laterality: Right;   TOTAL KNEE ARTHROPLASTY Left 01/05/2015   Procedure: TOTAL KNEE ARTHROPLASTY;  Surgeon: Elsie Saas, MD;  Location: [facility];  Service: Orthopedics;  Laterality: Left;   TOTAL KNEE ARTHROPLASTY Right 12/28/2015   Procedure: TOTAL KNEE ARTHROPLASTY;  Surgeon: Elsie Saas, MD;  Location: [facility];  Service: Orthopedics;  Laterality: Right;    OB History     Gravida  3   Para  2   Term  2   Preterm      AB  1   Living  2      SAB      IAB  1   Ectopic      Multiple      Live Births               Home Medications    Prior to Admission medications   Medication Sig Start Date End Date Taking? Authorizing Provider  gentamicin (GARAMYCIN) 0.3 % ophthalmic solution Place 2 drops into the right eye  3 (three) times daily for 5 days. 07/22/22 07/27/22 Yes Jamas Jaquay, Gwenlyn Perking, MD  albuterol (PROVENTIL HFA;VENTOLIN HFA) 108 (90 Base) MCG/ACT inhaler Inhale 2 puffs into the lungs every 6 (six) hours as needed for wheezing or shortness of breath. 05/05/18   Scot Jun, NP  amLODipine (NORVASC) 5 MG tablet Take by mouth. 08/23/21   [provider]  apixaban (ELIQUIS) 5 MG TABS tablet Take 5 mg by mouth 2 (two) times daily.    [provider]  atorvastatin (LIPITOR) 40 MG tablet Take 40 mg by mouth daily. 06/21/21   [provider]  benzonatate (TESSALON) 100 MG capsule Take 100 mg by mouth 3 (three) times daily. 08/24/21   [provider]  cetirizine (ZYRTEC) 10 MG tablet Take 1 tablet (10 mg total) by mouth daily. 05/05/18   Scot Jun, NP  estradiol (ESTRACE) 0.1 MG/GM vaginal cream Apply 1 gram per vagina every night for 2 weeks, then apply three times a week Patient not taking: Reported on 01/28/2021 12/24/20   Constant, Peggy, MD  famotidine (PEPCID) 20 MG tablet  10/06/21   [provider]  fluticasone Asencion Islam) 50 MCG/ACT nasal spray  09/21/19   [provider]  hydrOXYzine (ATARAX/VISTARIL) 25 MG tablet Take 25 mg by mouth at bedtime as needed. 10/20/20   [provider]  ipratropium-albuterol (DUONEB) 0.5-2.5 (3) MG/3ML SOLN Take 3 mLs by nebulization every 6 (six) hours as needed. 07/05/21   [provider]  JANUVIA 50 MG tablet  10/06/21   [provider]  latanoprost (XALATAN) 0.005 % ophthalmic solution Place 1 drop into both eyes at bedtime.  07/31/14   [provider]  losartan-hydrochlorothiazide Konrad Penta) 100-12.5 MG tablet  09/30/20   [provider]  LUMIGAN 0.01 % SOLN  09/21/19   [provider]  methimazole (TAPAZOLE) 5 MG tablet Take 1 tablet (5 mg total) by mouth daily. 10/25/19   Philemon Kingdom, MD  metoprolol succinate (TOPROL-XL) 25 MG 24 hr tablet Take 2 tablets (50  mg total) by mouth daily. 06/08/19   Debbe Odea, MD  metroNIDAZOLE (FLAGYL) 500 MG tablet Take 1 tablet (500 mg total) by mouth 2 (two) times daily. 12/25/20   Constant, Peggy, MD  mometasone-formoterol (DULERA) 100-5 MCG/ACT AERO Inhale into the lungs. Patient not taking: Reported on 01/28/2021  [provider]  olopatadine (PATANOL) 0.1 % ophthalmic solution Place 1 drop into both eyes as needed for allergies.    [provider]  pravastatin (PRAVACHOL) 80 MG tablet Take 80 mg by mouth at bedtime.     [provider]  propafenone (RYTHMOL) 150 MG tablet  07/10/19   [provider]  rosuvastatin (CRESTOR) 20 MG tablet Take 20 mg by mouth daily. 06/09/21   [provider]  sitaGLIPtin (JANUVIA) 100 MG tablet Take 100 mg by mouth daily.    [provider]  SYMBICORT 160-4.5 MCG/ACT inhaler Inhale 2 puffs into the lungs 2 (two) times daily. 07/14/20   [provider]  triamcinolone ointment (KENALOG) 0.1 %  10/04/21   [provider]  zolpidem (AMBIEN) 10 MG tablet Take 10 mg by mouth at bedtime as needed for sleep.    [provider]    Family History Family History  Problem Relation Age of Onset   Heart attack Father    Glaucoma Mother    Diabetes Mellitus II Mother    Hypertension Sister    Cancer Maternal Grandmother    Colon cancer Maternal Grandmother     Social History Social History   Tobacco Use   Smoking status: Former   Smokeless tobacco: Never   Tobacco comments:    quit smoking 31yr ago  Vaping Use   Vaping Use: Never used  Substance Use Topics   Alcohol use: No   Drug use: No     Allergies   Bactrim [sulfamethoxazole-trimethoprim], Ivp dye [iodinated contrast media], Calahist [pramoxine-calamine], Clonazepam, Sulfa antibiotics, Azithromycin, Codeine, Other, Tizanidine, and Vicodin [hydrocodone-acetaminophen]   Review of Systems Review of Systems  Eyes:  Positive for pain.      Physical Exam Triage Vital Signs ED Triage Vitals [07/22/22 1356]  Enc Vitals Group     BP (!) 153/74     Pulse Rate 68     Resp 16     Temp 98.2 F (36.8 C)     Temp Source Oral     SpO2 93 %     Weight      Height      Head Circumference      Peak Flow      Pain Score      Pain Loc      Pain Edu?      Excl. in [place]    No data found.  Updated Vital Signs BP (!) 153/74 (BP Location: Right Arm)   Pulse 68   Temp 98.2 F (36.8 C) (Oral)   Resp 16   SpO2 93%   Visual Acuity Right Eye Distance:   Left Eye Distance:   Bilateral Distance:    Right Eye Near:   Left Eye Near:    Bilateral Near:     Physical Exam Vitals reviewed.  Constitutional:      General: She is not in acute distress.    Appearance: She is not ill-appearing, toxic-appearing or diaphoretic.  Eyes:     Extraocular Movements: Extraocular movements intact.     Pupils: Pupils are equal, round, and reactive to light.     Comments: Right conjunctiva is injected.  There is some dried discharge on the inner and outer canthi.  There is very minimal swelling of the lower right lid.  Skin:    Coloration: Skin is not pale.  Neurological:     General: No focal deficit present.     Mental Status: She is alert  and oriented to person, place, and time.  Psychiatric:        Behavior: Behavior normal.      UC Treatments / Results  Labs (all labs ordered are listed, but only abnormal results are displayed) Labs Reviewed - No data to display  EKG   Radiology No results found.  Procedures Procedures (including critical care time)  Medications Ordered in UC Medications - No data to display  Initial Impression / Assessment and Plan / UC Course  I have reviewed the triage vital signs and the nursing notes.  Pertinent labs & imaging results that were available during my care of the patient were reviewed by me and considered in my medical decision making (see chart for details).         Gentamicin is sent in to treat the conjunctivitis. Final Clinical Impressions(s) / UC Diagnoses   Final diagnoses:  Acute conjunctivitis of right eye, unspecified acute conjunctivitis type     Discharge Instructions      Put gentamicin eyedrops in the affected eye(s) 3 times daily for 5 days.      ED Prescriptions     Medication Sig Dispense Auth. Provider   gentamicin (GARAMYCIN) 0.3 % ophthalmic solution Place 2 drops into the right eye 3 (three) times daily for 5 days. 5 mL Barrett Henle, MD      PDMP not reviewed this encounter.   Barrett Henle, MD 07/22/22 [phone]

## 2022-07-22 NOTE — ED Triage Notes (Signed)
Pt states right eye redness and itching since yesterday.

## 2022-09-19 ENCOUNTER — Ambulatory Visit: Payer: Medicare HMO | Admitting: Podiatry

## 2022-10-21 ENCOUNTER — Ambulatory Visit: Payer: 59 | Admitting: Podiatry

## 2022-11-02 ENCOUNTER — Ambulatory Visit (INDEPENDENT_AMBULATORY_CARE_PROVIDER_SITE_OTHER): Payer: 59 | Admitting: Podiatry

## 2022-11-02 ENCOUNTER — Encounter: Payer: Self-pay | Admitting: Podiatry

## 2022-11-02 DIAGNOSIS — E119 Type 2 diabetes mellitus without complications: Secondary | ICD-10-CM | POA: Diagnosis not present

## 2022-11-02 NOTE — Progress Notes (Signed)
This patient returns to my office for at risk foot care.  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 This patient presents for diabetic foot exam and desires to talk about  surgery. This patient presents for at risk foot care today.  General Appearance  Alert, conversant and in no acute stress.  Vascular  Dorsalis pedis and posterior tibial  pulses are palpable  bilaterally.  Capillary return is within normal limits  bilaterally. Temperature is within normal limits  bilaterally.  Neurologic  Senn-Weinstein monofilament wire test within normal limits  bilaterally. Muscle power within normal limits bilaterally.  Nails Normotropic nails with subungual debris  from hallux to fifth toes bilaterally. No evidence of bacterial infection or drainage bilaterally.  Orthopedic  No limitations of motion  feet .  No crepitus or effusions noted.  HAV  B/L.  Plantar flex fifth metatarsal right foot.  Skin  normotropic skin with no porokeratosis noted bilaterally.  No signs of infections or ulcers noted.     Diabetes with no complications  Consent was obtained for treatment procedures.  Diabetic foot exam reveals no vascular pathology or neurological pathology.  Referred to Dr. Allena Katz for surgery.  To consider diabetic shoes after her surgery.   Return office visit   prn                  Told patient to return for periodic foot care and evaluation due to potential at risk complications.   Helane Gunther DPM

## 2022-11-08 ENCOUNTER — Encounter: Payer: Self-pay | Admitting: Podiatry

## 2022-11-16 ENCOUNTER — Ambulatory Visit: Payer: 59 | Admitting: Podiatry

## 2022-12-07 ENCOUNTER — Ambulatory Visit: Payer: Medicare HMO | Admitting: Critical Care Medicine

## 2023-03-08 ENCOUNTER — Ambulatory Visit (INDEPENDENT_AMBULATORY_CARE_PROVIDER_SITE_OTHER): Payer: 59 | Admitting: Podiatry

## 2023-03-08 DIAGNOSIS — Z91199 Patient's noncompliance with other medical treatment and regimen due to unspecified reason: Secondary | ICD-10-CM

## 2023-03-08 NOTE — Progress Notes (Signed)
No show

## 2023-04-12 ENCOUNTER — Ambulatory Visit: Payer: 59 | Admitting: Podiatry

## 2023-04-21 ENCOUNTER — Ambulatory Visit: Payer: 59 | Admitting: Podiatry

## 2023-04-24 ENCOUNTER — Ambulatory Visit: Payer: 59 | Admitting: Podiatry

## 2023-05-31 ENCOUNTER — Other Ambulatory Visit: Payer: Self-pay | Admitting: Internal Medicine

## 2023-05-31 DIAGNOSIS — Z1231 Encounter for screening mammogram for malignant neoplasm of breast: Secondary | ICD-10-CM

## 2023-06-09 ENCOUNTER — Ambulatory Visit: Payer: 59

## 2023-06-21 ENCOUNTER — Encounter (HOSPITAL_COMMUNITY): Payer: Self-pay

## 2023-06-21 ENCOUNTER — Ambulatory Visit (HOSPITAL_COMMUNITY)
Admission: EM | Admit: 2023-06-21 | Discharge: 2023-06-21 | Disposition: A | Discharge status: Home / Self Care | Payer: 59 | Attending: Family Medicine | Admitting: Family Medicine

## 2023-06-21 DIAGNOSIS — M5442 Lumbago with sciatica, left side: Secondary | ICD-10-CM | POA: Diagnosis not present

## 2023-06-21 DIAGNOSIS — G8929 Other chronic pain: Secondary | ICD-10-CM

## 2023-06-21 DIAGNOSIS — M5441 Lumbago with sciatica, right side: Secondary | ICD-10-CM | POA: Diagnosis not present

## 2023-06-21 MED ORDER — DEXAMETHASONE SODIUM PHOSPHATE 10 MG/ML IJ SOLN
10.0000 mg | Freq: Once | INTRAMUSCULAR | Status: AC
  Administered 2023-06-21: 10 mg via INTRAMUSCULAR

## 2023-06-21 MED ORDER — DEXAMETHASONE SODIUM PHOSPHATE 10 MG/ML IJ SOLN
INTRAMUSCULAR | Status: AC
  Filled 2023-06-21: qty 1

## 2023-06-21 NOTE — ED Provider Notes (Signed)
 [facility] CARE CENTER   [id number] 06/21/23 Arrival Time: 9062  ASSESSMENT & PLAN:  1. Chronic bilateral low back pain with bilateral sciatica    Able to ambulate here and hemodynamically stable. No indication for plain imaging here. MRI fro 2023 shows significant lower back issues including disk bulge and spinal stenosis.  Meds ordered this encounter  Medications   dexamethasone  (DECADRON ) injection 10 mg   Recommend:  Follow-up Information     Schedule an appointment as soon as possible for a visit  with [facility].   Specialty: Specialist Contact information: [address]  [place] [address] [phone]         Schedule an appointment as soon as possible for a visit  with Reita Locus, MD.   Specialty: Internal Medicine Why: For follow up. Contact information: [address] [place] [address] [phone]                  Discharge Instructions       Meds ordered this encounter  Medications   dexamethasone  (DECADRON ) injection 10 mg   Be aware, your blood sugars may run higher than normal secondary to the shot your received today. Monitor closely.     Reviewed expectations re: course of current medical issues. Questions answered. Outlined signs and symptoms indicating need for more acute intervention. Patient verbalized understanding. After Visit Summary given.   SUBJECTIVE: History from: patient.  Brooke Davidson is a 78 y.o. female who presents with complaint of chronic low back pain; many years. Unclear of previous evaluations; not the best historian. Reports that my low back is just sore, mostly when I've been walking. Denies extremity sensation changes or weakness. Does report shooting pain down both posterior legs at times. Normal bowel/bladder habits. Normal ambulation. Denies trauma. OTC analgesics with some temporary help.  OBJECTIVE:  Vitals:   06/21/23 1002  BP: 109/61  Pulse: 76  Resp: 16   Temp: 97.9 F (36.6 C)  TempSrc: Oral  SpO2: 99%  Weight: 70.3 kg  Height: 5' 4 (1.626 m)    General appearance: alert; no distress HEENT: [place]; AT Neck: supple with FROM; without midline tenderness CV: regular Lungs: unlabored respirations; speaks full sentences without difficulty Abdomen: soft, non-tender; non-distended Back: mild  and poorly localized tenderness to palpation over bilateral lower back ; FROM at waist; bruising: none; denies midline tenderness Extremities: without edema; symmetrical without gross deformities; normal ROM of bilateral LE Skin: warm and dry Neurologic: normal gait; normal sensation and strength and reflexes of bilateral LE Psychological: alert and cooperative; normal mood and affect    Allergies  Allergen Reactions   Bactrim [Sulfamethoxazole-Trimethoprim] Swelling    Tongue swells   Ivp Dye [Iodinated Contrast Media] Hives   Calahist [Pramoxine-Calamine] Swelling   Clonazepam      dizziness   Sulfa Antibiotics Swelling   Azithromycin  Rash   Codeine Itching   Other Itching and Rash    Perfume   Tizanidine Rash   Vicodin [Hydrocodone-Acetaminophen ] Itching    Past Medical History:  Diagnosis Date   Anemia    in the past   Asthma    uses Symbicort  daily and ALbuterol  as needed   Chronic back pain    Complication of anesthesia    slow to wake up   Constipation    Contact dermatitis due to chemicals    Cough    nonproductive and without fever   Depression    but doesn't take any meds    Diabetes  mellitus without complication (HCC)    takes Metformin daily   Eczema    Fall during current hospitalization 12/30/2015 12/30/2015   GERD (gastroesophageal reflux disease)    doesn't take any meds   Glaucoma    uses eye drops daily   Heart murmur    states it was told to her as a younger woman, no problems that she knows    History of bronchitis a yr ago   HLD (hyperlipidemia)    takes Pravastatin  daily   Hypertension    takes  Losartan -HCTZ daily   Insomnia    takes Ambien  nightly    Joint pain    Joint swelling    Muscle spasm    takes Baclofen  daily as needed   Osteoarthritis    knees   Primary localized osteoarthritis of left knee    Primary localized osteoarthritis of right knee 12/17/2015   Pulmonary nodule    Rupture of right patellar tendon 12/31/2015   Patient fell the second evening after total knee replacement and suffer and open patella tendon rupture of her right knee.  Surgically repaired after an extensive irrigation and debridement on 12/30/2015   s/p right total knee replacement on 12/28/2015 12/28/2015   Urinary frequency    Urinary urgency    Social History   Socioeconomic History   Marital status: Divorced    Spouse name: Not on file   Number of children: Not on file   Years of education: Not on file   Highest education level: Not on file  Occupational History   Not on file  Tobacco Use   Smoking status: Former   Smokeless tobacco: Never   Tobacco comments:    quit smoking 46yrs ago  Vaping Use   Vaping status: Never Used  Substance and Sexual Activity   Alcohol use: No   Drug use: No   Sexual activity: Not Currently    Birth control/protection: None  Other Topics Concern   Not on file  Social History Narrative   Not on file   Social Drivers of Health   Financial Resource Strain: Patient Declined (09/28/2022)   Received from [facility], [facility]   Overall Financial Resource Strain (CARDIA)    Difficulty of Paying Living Expenses: Patient declined  Food Insecurity: Patient Declined (09/28/2022)   Received from [facility], [facility]   Hunger Vital Sign    Worried About Running Out of Food in the Last Year: Patient declined    Ran Out of Food in the Last Year: Patient declined  Transportation Needs: Patient Declined (09/28/2022)   Received from [employer], [facility]   PRAPARE - Transportation    Lack of Transportation (Medical): Patient declined     Lack of Transportation (Non-Medical): Patient declined  Physical Activity: Not on file  Stress: Not on file  Social Connections: Unknown (08/16/2022)   Received from [facility], [facility]   Social Network    Social Network: Not on file  Intimate Partner Violence: Unknown (08/16/2022)   Received from [facility], [facility]   HITS    Physically Hurt: Not on file    Insult or Talk Down To: Not on file    Threaten Physical Harm: Not on file    Scream or Curse: Not on file   Family History  Problem Relation Age of Onset   Heart attack Father    Glaucoma Mother    Diabetes Mellitus II Mother    Hypertension Sister    Cancer  Maternal Grandmother    Colon cancer Maternal Grandmother    Past Surgical History:  Procedure Laterality Date   GALLBLADDER SURGERY  1997   IRRIGATION AND DEBRIDEMENT KNEE Right 12/30/2015   Procedure: IRRIGATION AND DEBRIDEMENT KNEE;  Surgeon: Lamar Millman, MD;  Location: [facility] OR;  Service: Orthopedics;  Laterality: Right;   PATELLAR TENDON REPAIR Right 12/30/2015   Procedure: PATELLA TENDON REPAIR;  Surgeon: Lamar Millman, MD;  Location: [facility] OR;  Service: Orthopedics;  Laterality: Right;   TOTAL KNEE ARTHROPLASTY Left 01/05/2015   Procedure: TOTAL KNEE ARTHROPLASTY;  Surgeon: Lamar Millman, MD;  Location: [facility] OR;  Service: Orthopedics;  Laterality: Left;   TOTAL KNEE ARTHROPLASTY Right 12/28/2015   Procedure: TOTAL KNEE ARTHROPLASTY;  Surgeon: Lamar Millman, MD;  Location: [facility] OR;  Service: Orthopedics;  Laterality: Right;      Rolinda Rogue, MD 06/21/23 1053

## 2023-06-21 NOTE — Discharge Instructions (Addendum)
 Meds ordered this encounter  Medications   dexamethasone (DECADRON) injection 10 mg   Be aware, your blood sugars may run higher than normal secondary to the shot your received today. Monitor closely.

## 2023-06-21 NOTE — ED Triage Notes (Signed)
 Patient here today with c/o LB pain. Patient has chronic back pain but 2 weeks ago she started having increased pain after lifting groceries.

## 2023-07-06 ENCOUNTER — Other Ambulatory Visit: Payer: Self-pay | Admitting: Internal Medicine

## 2023-07-06 DIAGNOSIS — R200 Anesthesia of skin: Secondary | ICD-10-CM

## 2023-07-12 ENCOUNTER — Inpatient Hospital Stay: Admission: RE | Admit: 2023-07-12 | Payer: 59 | Source: Ambulatory Visit

## 2023-07-19 ENCOUNTER — Ambulatory Visit
Admission: RE | Admit: 2023-07-19 | Discharge: 2023-07-19 | Disposition: A | Discharge status: Home / Self Care | Payer: 59 | Source: Ambulatory Visit | Attending: Internal Medicine | Admitting: Internal Medicine

## 2023-07-19 DIAGNOSIS — R200 Anesthesia of skin: Secondary | ICD-10-CM

## 2023-07-21 ENCOUNTER — Encounter: Payer: Self-pay | Admitting: Neurology

## 2023-07-21 ENCOUNTER — Other Ambulatory Visit: Payer: Self-pay

## 2023-07-21 DIAGNOSIS — R202 Paresthesia of skin: Secondary | ICD-10-CM

## 2023-07-31 ENCOUNTER — Ambulatory Visit: Payer: 59 | Admitting: Podiatry

## 2023-08-09 ENCOUNTER — Ambulatory Visit: Payer: 59 | Admitting: Podiatry

## 2023-08-18 ENCOUNTER — Ambulatory Visit: Payer: 59

## 2023-08-21 ENCOUNTER — Ambulatory Visit (INDEPENDENT_AMBULATORY_CARE_PROVIDER_SITE_OTHER): Payer: 59 | Admitting: Neurology

## 2023-08-21 DIAGNOSIS — M5417 Radiculopathy, lumbosacral region: Secondary | ICD-10-CM

## 2023-08-21 DIAGNOSIS — R202 Paresthesia of skin: Secondary | ICD-10-CM

## 2023-08-21 NOTE — Procedures (Signed)
 [facility] Ed Neurology  [address] [place], [address]  [place], [place] [address] Tel: [phone] Fax: [phone] Test Date:  08/21/2023  Patient: Brooke Davidson DOB: 1945/12/30 Physician: Jacquelyne Balint, MD  Sex: Female Height: 5\' 4"  Ref Phys: Luis Abed, MD  ID#: [id number]   Technician:    History: This is a 77 year old female with pain in low back and legs.  NCV & EMG Findings: Extensive electrodiagnostic evaluation of bilateral lower limbs shows: Bilateral sural and superficial peroneal/fibular sensory responses are absent. Bilateral peroneal/fibular (EDB) motor responses show reduced amplitudes (L1.00, R1.36 mV). Bilateral tibial (AH) and bilateral peroneal/fibular (TA) motor responses are within normal limits. Bilateral H reflexes are absent. Chronic motor axon loss changes without accompanying active denervation changes are seen in bilateral tibialis anterior, bilateral gluteus medius, right medial head of gastrocnemius, and right short head of biceps femoris muscles. Lumbosacral paraspinal muscles were deferred due to patient being on therapeutic anticoagulation.  Impression: This is an abnormal study. 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 The findings are moderate in degree electrically. Absent sural and superficial peroneal/fibular sensory responses and H reflex may be normal for age, though a large fiber sensorimotor neuropathy is also possible. Recommend clinical correlation.    ___________________________ Jacquelyne Balint, MD    Nerve Conduction Studies Motor Nerve Results    Latency Amplitude F-Lat Segment Distance CV Comment  Site (ms) Norm (mV) Norm (ms)  (cm) (m/s) Norm   Left Fibular (EDB) Motor  Ankle 4.3  < 6.0 *1.00  > 2.5        Bel fib head 11.6 - 0.82 -  Bel fib head-Ankle 30 41  > 40   Pop fossa 13.9 - 0.71 -  Pop  fossa-Bel fib head 10 43 -   Right Fibular (EDB) Motor  Ankle 3.1  < 6.0 *1.36  > 2.5        Bel fib head 10.6 - 0.84 -  Bel fib head-Ankle 31 41  > 40   Pop fossa 12.7 - 0.74 -  Pop fossa-Bel fib head 9 43 -   Left Fibular (TA) Motor  Fib head 2.5  < 4.5 6.1  > 3.0        Pop fossa 4.2  < 6.7 6.1 -  Pop fossa-Fib head 10 59  > 40   Right Fibular (TA) Motor  Fib head 2.3  < 4.5 4.7  > 3.0        Pop fossa 3.7  < 6.7 4.6 -  Pop fossa-Fib head 8 57  > 40   Left Tibial (AH) Motor  Ankle 4.9  < 6.0 4.9  > 4.0        Knee 13.8 - 4.2 -  Knee-Ankle 39 44  > 40   Right Tibial (AH) Motor  Ankle 4.4  < 6.0 5.4  > 4.0        Knee 14.3 - 4.2 -  Knee-Ankle 42 42  > 40    Sensory Sites    Neg Peak Lat Amplitude (O-P) Segment Distance Velocity Comment  Site (ms) Norm (V) Norm  (cm) (ms)   Left Superficial Fibular Sensory  14 cm-Ankle *NR  < 4.6 *NR  > 3 14 cm-Ankle 14    Right Superficial Fibular Sensory  14 cm-Ankle *NR  < 4.6 *NR  > 3 14 cm-Ankle 14  Left Sural Sensory  Calf-Lat mall *NR  < 4.6 *NR  > 3 Calf-Lat mall 14    Right Sural Sensory  Calf-Lat mall *NR  < 4.6 *NR  > 3 Calf-Lat mall 14     H-Reflex Results    M-Lat H Lat H Neg Amp H-M Lat  Site (ms) (ms) Norm (mV) (ms)  Left Tibial H-Reflex  Pop fossa 6.7 -  < 35.0 - -  Right Tibial H-Reflex  Pop fossa 6.6 -  < 35.0 - -   Electromyography   Side Muscle Ins.Act Fibs Fasc Recrt Amp Dur Poly Activation Comment  Left Tib ant Nml Nml Nml *2- *1+ *1+ Nml Nml N/A  Left Gastroc MH Nml Nml Nml Nml Nml Nml Nml Nml N/A  Left Vastus lat Nml Nml Nml Nml Nml Nml Nml Nml N/A  Left Biceps fem SH Nml Nml Nml Nml Nml Nml Nml Nml N/A  Left Gluteus med Nml Nml Nml *1- *1+ *1+ Nml Nml N/A  Right Tib ant Nml Nml Nml *2- *1+ *1+ Nml Nml N/A  Right Gastroc MH Nml Nml Nml *1- *1+ *1+ Nml Nml N/A  Right Vastus lat Nml Nml Nml Nml Nml Nml Nml Nml N/A  Right Biceps fem SH Nml Nml Nml *1- *1+ *1+ Nml Nml N/A  Right Gluteus med Nml Nml Nml *1-  *1+ *1+ Nml Nml N/A      Waveforms:  Motor               Sensory           H-Reflex

## 2023-08-23 ENCOUNTER — Encounter: Payer: Self-pay | Admitting: Podiatry

## 2023-08-23 ENCOUNTER — Ambulatory Visit (INDEPENDENT_AMBULATORY_CARE_PROVIDER_SITE_OTHER): Payer: 59 | Admitting: Podiatry

## 2023-08-23 DIAGNOSIS — B351 Tinea unguium: Secondary | ICD-10-CM

## 2023-08-23 DIAGNOSIS — M79675 Pain in left toe(s): Secondary | ICD-10-CM | POA: Diagnosis not present

## 2023-08-23 DIAGNOSIS — M79674 Pain in right toe(s): Secondary | ICD-10-CM | POA: Diagnosis not present

## 2023-08-23 DIAGNOSIS — E119 Type 2 diabetes mellitus without complications: Secondary | ICD-10-CM

## 2023-09-04 ENCOUNTER — Telehealth: Payer: Self-pay

## 2023-09-05 NOTE — Telephone Encounter (Signed)
 N/A error

## 2023-09-07 NOTE — Progress Notes (Signed)
 Chief Complaint  Patient presents with   Callouses    Patient states she has Callouses on bilateral feet patient states she wants them removed the right way so they wont grow back how they are. Patient is also here for [facility]    SUBJECTIVE Patient with a history of diabetes mellitus presents to office today complaining of elongated, thickened nails that cause pain while ambulating in shoes.  Patient is unable to trim their own nails. Patient is here for further evaluation and treatment.  Past Medical History:  Diagnosis Date   Anemia    in the past   Asthma    uses Symbicort daily and ALbuterol as needed   Chronic back pain    Complication of anesthesia    slow to wake up   Constipation    Contact dermatitis due to chemicals    Cough    nonproductive and without fever   Depression    but doesn't take any meds    Diabetes mellitus without complication (HCC)    takes Metformin daily   Eczema    Fall during current hospitalization 12/30/2015 12/30/2015   GERD (gastroesophageal reflux disease)    doesn't take any meds   Glaucoma    uses eye drops daily   Heart murmur    states it was told to her as a younger woman, no problems that she knows    History of bronchitis a yr ago   HLD (hyperlipidemia)    takes Pravastatin daily   Hypertension    takes Losartan-HCTZ daily   Insomnia    takes Ambien nightly    Joint pain    Joint swelling    Muscle spasm    takes Baclofen daily as needed   Osteoarthritis    knees   Primary localized osteoarthritis of left knee    Primary localized osteoarthritis of right knee 12/17/2015   Pulmonary nodule    Rupture of right patellar tendon 12/31/2015   Patient fell the second evening after total knee replacement and suffer and open patella tendon rupture of her right knee.  Surgically repaired after an extensive irrigation and debridement on 12/30/2015   s/p right total knee replacement on 12/28/2015 12/28/2015   Urinary frequency    Urinary  urgency     Allergies  Allergen Reactions   Bactrim [Sulfamethoxazole-Trimethoprim] Swelling    Tongue swells   Ivp Dye [Iodinated Contrast Media] Hives   Calahist [Pramoxine-Calamine] Swelling   Clonazepam     dizziness   Sulfa Antibiotics Swelling   Azithromycin Rash   Codeine Itching   Other Itching and Rash    Perfume   Tizanidine Rash   Vicodin [Hydrocodone-Acetaminophen] Itching     OBJECTIVE General Patient is awake, alert, and oriented x 3 and in no acute distress. Derm Skin is dry and supple bilateral. Negative open lesions or macerations. Remaining integument unremarkable. Nails are tender, long, thickened and dystrophic with subungual debris, consistent with onychomycosis, 1-5 bilateral. No signs of infection noted. Vasc  DP and PT pedal pulses palpable bilaterally. Temperature gradient within normal limits.  Neuro Epicritic and protective threshold sensation diminished bilaterally.  Musculoskeletal Exam No symptomatic pedal deformities noted bilateral. Muscular strength within normal limits.  ASSESSMENT 1. Diabetes Mellitus w/ peripheral neuropathy 2.  Pain due to onychomycosis of toenails bilateral  PLAN OF CARE 1. Patient evaluated today.  Comprehensive diabetic foot exam performed today 2. Instructed to maintain good pedal hygiene and foot care. Stressed importance of controlling blood sugar.  3. Mechanical debridement of nails 1-5 bilaterally performed using a nail nipper. Filed with dremel without incident.  4. Return to clinic in 3 mos.     Felecia Shelling, DPM [facility]  Dr. Felecia Shelling, DPM    [address] [place], [place] [address]                Office [phone]  Fax [phone]

## 2023-09-12 NOTE — Progress Notes (Deleted)
 NEW GYNECOLOGY PATIENT Patient name: Brooke Davidson MRN [id number]  Date of birth: September 27, 1945 Chief Complaint:   No chief complaint on file.     History:  KRYSTIANNA SOTH is a 78 y.o. [phone] being seen today for ***.         Gynecologic History No LMP recorded. Patient is postmenopausal. Contraception: {method:5051} Last Pap: ***. Result was {norm/abn:16337} with negative HPV Last Mammogram: {NA AND JYNWGNFA:21308} Last Colonoscopy: {NA AND MVHQIONG:29528}  Obstetric History OB History  Gravida Para Term Preterm AB Living  3 2 2  1 2   SAB IAB Ectopic Multiple Live Births   1       # Outcome Date GA Lbr Len/2nd Weight Sex Type Anes PTL Lv  3 IAB           2 Term           1 Term             Past Medical History:  Diagnosis Date   Anemia    in the past   Asthma    uses Symbicort daily and ALbuterol as needed   Chronic back pain    Complication of anesthesia    slow to wake up   Constipation    Contact dermatitis due to chemicals    Cough    nonproductive and without fever   Depression    but doesn't take any meds    Diabetes mellitus without complication (HCC)    takes Metformin daily   Eczema    Fall during current hospitalization 12/30/2015 12/30/2015   GERD (gastroesophageal reflux disease)    doesn't take any meds   Glaucoma    uses eye drops daily   Heart murmur    states it was told to her as a younger woman, no problems that she knows    History of bronchitis a yr ago   HLD (hyperlipidemia)    takes Pravastatin daily   Hypertension    takes Losartan-HCTZ daily   Insomnia    takes Ambien nightly    Joint pain    Joint swelling    Muscle spasm    takes Baclofen daily as needed   Osteoarthritis    knees   Primary localized osteoarthritis of left knee    Primary localized osteoarthritis of right knee 12/17/2015   Pulmonary nodule    Rupture of right patellar tendon 12/31/2015   Patient fell the second evening after total knee replacement  and suffer and open patella tendon rupture of her right knee.  Surgically repaired after an extensive irrigation and debridement on 12/30/2015   s/p right total knee replacement on 12/28/2015 12/28/2015   Urinary frequency    Urinary urgency     Past Surgical History:  Procedure Laterality Date   GALLBLADDER SURGERY  1997   IRRIGATION AND DEBRIDEMENT KNEE Right 12/30/2015   Procedure: IRRIGATION AND DEBRIDEMENT KNEE;  Surgeon: Salvatore Marvel, MD;  Location: [facility] OR;  Service: Orthopedics;  Laterality: Right;   PATELLAR TENDON REPAIR Right 12/30/2015   Procedure: PATELLA TENDON REPAIR;  Surgeon: Salvatore Marvel, MD;  Location: [facility] OR;  Service: Orthopedics;  Laterality: Right;   TOTAL KNEE ARTHROPLASTY Left 01/05/2015   Procedure: TOTAL KNEE ARTHROPLASTY;  Surgeon: Salvatore Marvel, MD;  Location: [facility] OR;  Service: Orthopedics;  Laterality: Left;   TOTAL KNEE ARTHROPLASTY Right 12/28/2015   Procedure: TOTAL KNEE ARTHROPLASTY;  Surgeon: Salvatore Marvel, MD;  Location: [facility] OR;  Service: Orthopedics;  Laterality: Right;  Current Outpatient Medications on File Prior to Visit  Medication Sig Dispense Refill   albuterol (PROVENTIL HFA;VENTOLIN HFA) 108 (90 Base) MCG/ACT inhaler Inhale 2 puffs into the lungs every 6 (six) hours as needed for wheezing or shortness of breath. 8 g 0   amitriptyline (ELAVIL) 10 MG tablet Take 10 mg by mouth at bedtime.     amLODipine (NORVASC) 5 MG tablet Take by mouth.     apixaban (ELIQUIS) 5 MG TABS tablet Take 5 mg by mouth 2 (two) times daily.     atorvastatin (LIPITOR) 40 MG tablet Take 40 mg by mouth daily.     benzonatate (TESSALON) 100 MG capsule Take 100 mg by mouth 3 (three) times daily.     cetirizine (ZYRTEC) 10 MG tablet Take 1 tablet (10 mg total) by mouth daily. 90 tablet 0   Cyanocobalamin 2500 MCG TABS Take by mouth.     estradiol (ESTRACE) 0.1 MG/GM vaginal cream Apply 1 gram per vagina every night for 2 weeks, then apply three times a week 30 g 12   famotidine  (PEPCID) 20 MG tablet      fluticasone (FLONASE) 50 MCG/ACT nasal spray      hydrOXYzine (ATARAX/VISTARIL) 25 MG tablet Take 25 mg by mouth at bedtime as needed.     ipratropium-albuterol (DUONEB) 0.5-2.5 (3) MG/3ML SOLN Take 3 mLs by nebulization every 6 (six) hours as needed.     JANUVIA 50 MG tablet      latanoprost (XALATAN) 0.005 % ophthalmic solution Place 1 drop into both eyes at bedtime.   12   losartan (COZAAR) 100 MG tablet Take 100 mg by mouth daily.     LUMIGAN 0.01 % SOLN      methimazole (TAPAZOLE) 5 MG tablet Take 1 tablet (5 mg total) by mouth daily. 45 tablet 3   metoprolol succinate (TOPROL-XL) 25 MG 24 hr tablet Take 2 tablets (50 mg total) by mouth daily. 30 tablet 0   metroNIDAZOLE (FLAGYL) 500 MG tablet Take 1 tablet (500 mg total) by mouth 2 (two) times daily. 14 tablet 0   mometasone-formoterol (DULERA) 100-5 MCG/ACT AERO Inhale into the lungs.     olopatadine (PATANOL) 0.1 % ophthalmic solution Place 1 drop into both eyes as needed for allergies.     potassium chloride (KLOR-CON) 10 MEQ tablet Take 10 mEq by mouth once.     pravastatin (PRAVACHOL) 80 MG tablet Take 80 mg by mouth at bedtime.      propafenone (RYTHMOL) 150 MG tablet      rosuvastatin (CRESTOR) 20 MG tablet Take 20 mg by mouth daily.     sitaGLIPtin (JANUVIA) 100 MG tablet Take 100 mg by mouth daily.     SYMBICORT 160-4.5 MCG/ACT inhaler Inhale 2 puffs into the lungs 2 (two) times daily.     triamcinolone ointment (KENALOG) 0.1 %      zolpidem (AMBIEN) 10 MG tablet Take 10 mg by mouth at bedtime as needed for sleep.     No current facility-administered medications on file prior to visit.    Allergies  Allergen Reactions   Bactrim [Sulfamethoxazole-Trimethoprim] Swelling    Tongue swells   Ivp Dye [Iodinated Contrast Media] Hives   Calahist [Pramoxine-Calamine] Swelling   Clonazepam     dizziness   Sulfa Antibiotics Swelling   Azithromycin Rash   Codeine Itching   Other Itching and Rash     Perfume   Tizanidine Rash   Vicodin [Hydrocodone-Acetaminophen] Itching    Social History:  reports that she has quit smoking. She has never used smokeless tobacco. She reports that she does not drink alcohol and does not use drugs.  Family History  Problem Relation Age of Onset   Heart attack Father    Glaucoma Mother    Diabetes Mellitus II Mother    Hypertension Sister    Cancer Maternal Grandmother    Colon cancer Maternal Grandmother     The following portions of the patient's history were reviewed and updated as appropriate: allergies, current medications, past family history, past medical history, past social history, past surgical history and problem list.  Review of Systems Pertinent items noted in HPI and remainder of comprehensive ROS otherwise negative.  Physical Exam:  There were no vitals taken for this visit. Physical Exam     Assessment and Plan:   There are no diagnoses linked to this encounter.   Routine preventative health maintenance measures emphasized. Please refer to After Visit Summary for other counseling recommendations.   Follow-up: No follow-ups on file.      Lorriane Shire, MD Obstetrician & Gynecologist, Faculty Practice [facility] for [employer], [facility]

## 2023-09-13 ENCOUNTER — Encounter: Admitting: Obstetrics and Gynecology

## 2023-10-04 ENCOUNTER — Other Ambulatory Visit

## 2023-10-11 ENCOUNTER — Telehealth: Payer: Self-pay | Admitting: Family Medicine

## 2023-10-11 NOTE — Telephone Encounter (Signed)
 Patient called to confirm her appointment. Also wanted help with directins bring that she will be taking the bus and is new to [place] . I helped the patient identify landmarks to help get her here.

## 2023-10-18 ENCOUNTER — Ambulatory Visit: Admitting: Obstetrics and Gynecology

## 2023-10-18 VITALS — BP 171/76 | HR 53 | Wt 168.0 lb

## 2023-10-18 DIAGNOSIS — N958 Other specified menopausal and perimenopausal disorders: Secondary | ICD-10-CM

## 2023-10-18 MED ORDER — ESTRADIOL 0.1 MG/GM VA CREA
TOPICAL_CREAM | VAGINAL | 12 refills | Status: AC
Start: 2023-10-18 — End: ?

## 2023-10-18 NOTE — Patient Instructions (Signed)
 Apply aquaphor at night as needed where it itches

## 2023-10-18 NOTE — Progress Notes (Signed)
 NEW GYNECOLOGY PATIENT Patient name: Brooke Davidson MRN [id number]  Date of birth: 05-09-1946 Chief Complaint:   Mass (Vaginal area, outer corner, states "you can't see it, you'll have to feel it" bilateral sides, no pain, itches, no odor, not sexually active)     History:  Brooke Davidson is a 78 y.o. [id number] being seen today for vulvar concerns.   Having itching on her inner labia majora. Feels small bump on labia minora, nontender and no drainge or abnormal discharge.        Gynecologic History No LMP recorded. Patient is postmenopausal. Contraception: post menopausal status Last Pap: No results found for: "DIAGPAP", "HPVHIGH", "ADEQPAP" Last Mammogram: 01/2021 BIRADS 1 Last Colonoscopy: 2017  Obstetric History OB History  Gravida Para Term Preterm AB Living  3 2 2  1 2   SAB IAB Ectopic Multiple Live Births   1       # Outcome Date GA Lbr Len/2nd Weight Sex Type Anes PTL Lv  3 IAB           2 Term           1 Term             Past Medical History:  Diagnosis Date   Anemia    in the past   Asthma    uses Symbicort  daily and ALbuterol  as needed   Chronic back pain    Complication of anesthesia    slow to wake up   Constipation    Contact dermatitis due to chemicals    Cough    nonproductive and without fever   Depression    but doesn't take any meds    Diabetes mellitus without complication (HCC)    takes Metformin daily   Eczema    Fall during current hospitalization 12/30/2015 12/30/2015   GERD (gastroesophageal reflux disease)    doesn't take any meds   Glaucoma    uses eye drops daily   Heart murmur    states it was told to her as a younger woman, no problems that she knows    History of bronchitis a yr ago   HLD (hyperlipidemia)    takes Pravastatin  daily   Hypertension    takes Losartan -HCTZ daily   Insomnia    takes Ambien  nightly    Joint pain    Joint swelling    Muscle spasm    takes Baclofen  daily as needed   Osteoarthritis    knees    Primary localized osteoarthritis of left knee    Primary localized osteoarthritis of right knee 12/17/2015   Pulmonary nodule    Rupture of right patellar tendon 12/31/2015   Patient fell the second evening after total knee replacement and suffer and open patella tendon rupture of her right knee.  Surgically repaired after an extensive irrigation and debridement on 12/30/2015   s/p right total knee replacement on 12/28/2015 12/28/2015   Urinary frequency    Urinary urgency     Past Surgical History:  Procedure Laterality Date   GALLBLADDER SURGERY  1997   IRRIGATION AND DEBRIDEMENT KNEE Right 12/30/2015   Procedure: IRRIGATION AND DEBRIDEMENT KNEE;  Surgeon: Elly Habermann, MD;  Location: [facility] OR;  Service: Orthopedics;  Laterality: Right;   PATELLAR TENDON REPAIR Right 12/30/2015   Procedure: PATELLA TENDON REPAIR;  Surgeon: Elly Habermann, MD;  Location: [facility] OR;  Service: Orthopedics;  Laterality: Right;   TOTAL KNEE ARTHROPLASTY Left 01/05/2015   Procedure: TOTAL KNEE ARTHROPLASTY;  Surgeon:  Elly Habermann, MD;  Location: [facility] OR;  Service: Orthopedics;  Laterality: Left;   TOTAL KNEE ARTHROPLASTY Right 12/28/2015   Procedure: TOTAL KNEE ARTHROPLASTY;  Surgeon: Elly Habermann, MD;  Location: [facility] OR;  Service: Orthopedics;  Laterality: Right;    Current Outpatient Medications on File Prior to Visit  Medication Sig Dispense Refill   albuterol  (PROVENTIL  HFA;VENTOLIN  HFA) 108 (90 Base) MCG/ACT inhaler Inhale 2 puffs into the lungs every 6 (six) hours as needed for wheezing or shortness of breath. 8 g 0   amitriptyline (ELAVIL) 10 MG tablet Take 10 mg by mouth at bedtime.     amLODipine (NORVASC) 5 MG tablet Take by mouth.     apixaban  (ELIQUIS ) 5 MG TABS tablet Take 5 mg by mouth 2 (two) times daily.     atorvastatin (LIPITOR) 40 MG tablet Take 40 mg by mouth daily.     benzonatate (TESSALON) 100 MG capsule Take 100 mg by mouth 3 (three) times daily.     cetirizine  (ZYRTEC ) 10 MG tablet Take 1 tablet  (10 mg total) by mouth daily. 90 tablet 0   Cyanocobalamin 2500 MCG TABS Take by mouth.     estradiol  (ESTRACE ) 0.1 MG/GM vaginal cream Apply 1 gram per vagina every night for 2 weeks, then apply three times a week 30 g 12   famotidine (PEPCID) 20 MG tablet      fluticasone (FLONASE) 50 MCG/ACT nasal spray      hydrOXYzine  (ATARAX /VISTARIL ) 25 MG tablet Take 25 mg by mouth at bedtime as needed.     ipratropium-albuterol  (DUONEB) 0.5-2.5 (3) MG/3ML SOLN Take 3 mLs by nebulization every 6 (six) hours as needed.     JANUVIA 50 MG tablet      latanoprost  (XALATAN ) 0.005 % ophthalmic solution Place 1 drop into both eyes at bedtime.   12   losartan  (COZAAR ) 100 MG tablet Take 100 mg by mouth daily.     LUMIGAN 0.01 % SOLN      methimazole  (TAPAZOLE ) 5 MG tablet Take 1 tablet (5 mg total) by mouth daily. 45 tablet 3   metoprolol  succinate (TOPROL -XL) 25 MG 24 hr tablet Take 2 tablets (50 mg total) by mouth daily. 30 tablet 0   metroNIDAZOLE  (FLAGYL ) 500 MG tablet Take 1 tablet (500 mg total) by mouth 2 (two) times daily. 14 tablet 0   mometasone -formoterol  (DULERA ) 100-5 MCG/ACT AERO Inhale into the lungs.     olopatadine (PATANOL) 0.1 % ophthalmic solution Place 1 drop into both eyes as needed for allergies.     potassium chloride  (KLOR-CON ) 10 MEQ tablet Take 10 mEq by mouth once.     pravastatin  (PRAVACHOL ) 80 MG tablet Take 80 mg by mouth at bedtime.      propafenone (RYTHMOL) 150 MG tablet      rosuvastatin (CRESTOR) 20 MG tablet Take 20 mg by mouth daily.     sitaGLIPtin (JANUVIA) 100 MG tablet Take 100 mg by mouth daily.     SYMBICORT  160-4.5 MCG/ACT inhaler Inhale 2 puffs into the lungs 2 (two) times daily.     triamcinolone ointment (KENALOG) 0.1 %      zolpidem  (AMBIEN ) 10 MG tablet Take 10 mg by mouth at bedtime as needed for sleep.     No current facility-administered medications on file prior to visit.    Allergies  Allergen Reactions   Bactrim [Sulfamethoxazole-Trimethoprim]  Swelling    Tongue swells   Ivp Dye [Iodinated Contrast Media] Hives   Calahist [Pramoxine-Calamine] Swelling  Clonazepam      dizziness   Sulfa Antibiotics Swelling   Azithromycin  Rash   Codeine Itching   Other Itching and Rash    Perfume   Tizanidine Rash   Vicodin [Hydrocodone-Acetaminophen ] Itching    Social History:  reports that she has quit smoking. She has never used smokeless tobacco. She reports that she does not drink alcohol and does not use drugs.  Family History  Problem Relation Age of Onset   Heart attack Father    Glaucoma Mother    Diabetes Mellitus II Mother    Hypertension Sister    Cancer Maternal Grandmother    Colon cancer Maternal Grandmother     The following portions of the patient's history were reviewed and updated as appropriate: allergies, current medications, past family history, past medical history, past social history, past surgical history and problem list.  Review of Systems Pertinent items noted in HPI and remainder of comprehensive ROS otherwise negative.  Physical Exam:  BP (!) 171/76 (BP Location: Left Arm, Patient Position: Sitting, Cuff Size: Normal)   Pulse (!) 53   Wt 168 lb (76.2 kg)   SpO2 98%   BMI 28.84 kg/m  Physical Exam Vitals and nursing note reviewed. Exam conducted with a chaperone present.  Constitutional:      Appearance: Normal appearance.  Pulmonary:     Effort: Pulmonary effort is normal.  Abdominal:     Palpations: Abdomen is soft.  Genitourinary:    General: Normal vulva.     Exam position: Lithotomy position.     Comments: Atrophic vaginal changes  Neurological:     Mental Status: She is alert.        Assessment and Plan:   1. Genitourinary syndrome of menopause (Primary) Vaginal estrogen to address changes related to menopause. Recommend application of aquaphor prn for vulvar pruritus.  - estradiol  (ESTRACE ) 0.1 MG/GM vaginal cream; Apply 1 gram per vagina every night for 2 weeks, then apply  three times a week  Dispense: 30 g; Refill: 12    Routine preventative health maintenance measures emphasized. Please refer to After Visit Summary for other counseling recommendations.   Follow-up: No follow-ups on file.      Kiki Pelton, MD Obstetrician & Gynecologist, [facility] [employer], [facility]

## 2023-10-23 ENCOUNTER — Ambulatory Visit

## 2023-10-25 ENCOUNTER — Ambulatory Visit: Admitting: Obstetrics and Gynecology

## 2023-11-01 ENCOUNTER — Telehealth: Payer: Self-pay

## 2023-11-01 NOTE — Telephone Encounter (Signed)
 Called to resched 11/22/23 appt// couldn't leave VM

## 2023-11-22 ENCOUNTER — Other Ambulatory Visit

## 2023-12-20 ENCOUNTER — Telehealth: Payer: Self-pay | Admitting: Podiatry

## 2023-12-20 NOTE — Telephone Encounter (Signed)
 I told her from my understanding we aren't taking new diabetic shoe orders. Is this patient's appt on 7/14 for shoes or insole?

## 2023-12-20 NOTE — Telephone Encounter (Signed)
 Patient called to confirm her appointment for diabetic shoes.

## 2023-12-25 ENCOUNTER — Other Ambulatory Visit

## 2024-02-05 ENCOUNTER — Ambulatory Visit: Admitting: Cardiology

## 2024-03-26 ENCOUNTER — Ambulatory Visit: Admitting: Cardiology

## 2024-04-24 ENCOUNTER — Ambulatory Visit: Admitting: Podiatry

## 2024-05-01 NOTE — Progress Notes (Signed)
 No show

## 2024-05-02 ENCOUNTER — Ambulatory Visit: Attending: Cardiology | Admitting: Cardiology

## 2024-05-02 DIAGNOSIS — I10 Essential (primary) hypertension: Secondary | ICD-10-CM

## 2024-05-02 DIAGNOSIS — Z91199 Patient's noncompliance with other medical treatment and regimen due to unspecified reason: Secondary | ICD-10-CM

## 2024-07-05 ENCOUNTER — Encounter: Payer: Self-pay | Admitting: Cardiology

## 2024-07-10 ENCOUNTER — Ambulatory Visit: Admitting: Podiatry
